# Patient Record
Sex: Male | Born: 1935 | Race: White | Hispanic: No | Marital: Married | Smoking: Never smoker
Health system: Southern US, Community
[De-identification: ages and names within clinical notes are randomized; demographics above are authoritative.]

## PROBLEM LIST (undated history)

## (undated) ENCOUNTER — Emergency Department (HOSPITAL_COMMUNITY): Admission: EM | Payer: Medicare Other | Source: Home / Self Care

## (undated) DIAGNOSIS — M1990 Unspecified osteoarthritis, unspecified site: Secondary | ICD-10-CM

## (undated) DIAGNOSIS — I350 Nonrheumatic aortic (valve) stenosis: Secondary | ICD-10-CM

## (undated) DIAGNOSIS — I2510 Atherosclerotic heart disease of native coronary artery without angina pectoris: Secondary | ICD-10-CM

## (undated) DIAGNOSIS — I509 Heart failure, unspecified: Secondary | ICD-10-CM

## (undated) DIAGNOSIS — E559 Vitamin D deficiency, unspecified: Secondary | ICD-10-CM

## (undated) DIAGNOSIS — Z8619 Personal history of other infectious and parasitic diseases: Secondary | ICD-10-CM

## (undated) DIAGNOSIS — H409 Unspecified glaucoma: Secondary | ICD-10-CM

## (undated) DIAGNOSIS — I493 Ventricular premature depolarization: Secondary | ICD-10-CM

## (undated) DIAGNOSIS — M720 Palmar fascial fibromatosis [Dupuytren]: Secondary | ICD-10-CM

## (undated) DIAGNOSIS — N419 Inflammatory disease of prostate, unspecified: Secondary | ICD-10-CM

## (undated) DIAGNOSIS — C801 Malignant (primary) neoplasm, unspecified: Secondary | ICD-10-CM

## (undated) DIAGNOSIS — I10 Essential (primary) hypertension: Secondary | ICD-10-CM

## (undated) DIAGNOSIS — E785 Hyperlipidemia, unspecified: Secondary | ICD-10-CM

## (undated) DIAGNOSIS — E538 Deficiency of other specified B group vitamins: Secondary | ICD-10-CM

## (undated) DIAGNOSIS — K649 Unspecified hemorrhoids: Secondary | ICD-10-CM

## (undated) DIAGNOSIS — E119 Type 2 diabetes mellitus without complications: Secondary | ICD-10-CM

## (undated) DIAGNOSIS — G629 Polyneuropathy, unspecified: Secondary | ICD-10-CM

## (undated) HISTORY — PX: MELANOMA EXCISION: SHX5266

## (undated) HISTORY — DX: Unspecified osteoarthritis, unspecified site: M19.90

## (undated) HISTORY — DX: Unspecified glaucoma: H40.9

## (undated) HISTORY — DX: Type 2 diabetes mellitus without complications: E11.9

## (undated) HISTORY — DX: Inflammatory disease of prostate, unspecified: N41.9

## (undated) HISTORY — PX: APPENDECTOMY: SHX54

## (undated) HISTORY — DX: Personal history of other infectious and parasitic diseases: Z86.19

## (undated) HISTORY — DX: Polyneuropathy, unspecified: G62.9

## (undated) HISTORY — DX: Deficiency of other specified B group vitamins: E53.8

## (undated) HISTORY — DX: Unspecified hemorrhoids: K64.9

## (undated) HISTORY — DX: Essential (primary) hypertension: I10

## (undated) HISTORY — DX: Malignant (primary) neoplasm, unspecified: C80.1

## (undated) HISTORY — DX: Hyperlipidemia, unspecified: E78.5

## (undated) HISTORY — DX: Palmar fascial fibromatosis (dupuytren): M72.0

## (undated) HISTORY — DX: Vitamin D deficiency, unspecified: E55.9

## (undated) HISTORY — DX: Ventricular premature depolarization: I49.3

## (undated) HISTORY — PX: HERNIA REPAIR: SHX51

## (undated) SURGICAL SUPPLY — 20 items
BALLOON MUSTANG 5X200X135 (BALLOONS) IMPLANT
CATH CXI SUPP 2.6F 150 ANG (CATHETERS) IMPLANT
CATH OMNI FLUSH 5F 65CM (CATHETERS) IMPLANT
CATH QUICKCROSS .035X135CM (MICROCATHETER) IMPLANT
GLIDEWIRE ADV .035X260CM (WIRE) IMPLANT
KIT ENCORE 26 ADVANTAGE (KITS) IMPLANT
KIT MICROPUNCTURE NIT STIFF (SHEATH) IMPLANT
KIT SINGLE USE MANIFOLD (KITS) IMPLANT
KIT SYRINGE INJ CVI SPIKEX1 (MISCELLANEOUS) IMPLANT
PACK CARDIAC CATHETERIZATION (CUSTOM PROCEDURE TRAY) IMPLANT
SET ATX-X65L (MISCELLANEOUS) IMPLANT
SHEATH CATAPULT 6FR 45 (SHEATH) IMPLANT
SHEATH PINNACLE 5F 10CM (SHEATH) IMPLANT
SHEATH PINNACLE 6F 10CM (SHEATH) IMPLANT
SHEATH PROBE COVER 6X72 (BAG) IMPLANT
STENT ELUVIA 6X120X130 (Permanent Stent) IMPLANT
STENT ELUVIA 6X150X130 (Permanent Stent) IMPLANT
WIRE BENTSON .035X145CM (WIRE) IMPLANT
WIRE G V18X300CM (WIRE) IMPLANT
WIRE HI TORQ COMMND ES.014X300 (WIRE) IMPLANT

---

## 1989-09-13 HISTORY — PX: COLON SURGERY: SHX602

## 1999-07-03 ENCOUNTER — Ambulatory Visit (HOSPITAL_COMMUNITY): Admission: RE | Admit: 1999-07-03 | Discharge: 1999-07-03 | Payer: Self-pay | Admitting: Gastroenterology

## 2004-06-23 ENCOUNTER — Ambulatory Visit (HOSPITAL_COMMUNITY): Admission: RE | Admit: 2004-06-23 | Discharge: 2004-06-23 | Payer: Self-pay | Admitting: Gastroenterology

## 2004-06-23 ENCOUNTER — Encounter (INDEPENDENT_AMBULATORY_CARE_PROVIDER_SITE_OTHER): Payer: Self-pay | Admitting: *Deleted

## 2006-07-18 ENCOUNTER — Encounter: Admission: RE | Admit: 2006-07-18 | Discharge: 2006-07-18 | Payer: Self-pay | Admitting: Gastroenterology

## 2008-08-14 ENCOUNTER — Encounter: Admission: RE | Admit: 2008-08-14 | Discharge: 2008-08-14 | Payer: Self-pay | Admitting: Gastroenterology

## 2008-09-12 ENCOUNTER — Encounter: Admission: RE | Admit: 2008-09-12 | Discharge: 2008-09-12 | Payer: Self-pay | Admitting: General Surgery

## 2009-11-05 ENCOUNTER — Ambulatory Visit (HOSPITAL_COMMUNITY): Admission: RE | Admit: 2009-11-05 | Discharge: 2009-11-05 | Payer: Self-pay | Admitting: Internal Medicine

## 2011-04-28 ENCOUNTER — Inpatient Hospital Stay (HOSPITAL_COMMUNITY)
Admission: EM | Admit: 2011-04-28 | Discharge: 2011-04-30 | DRG: 312 | Disposition: A | Discharge status: Home / Self Care | Payer: Medicare Other | Attending: Internal Medicine | Admitting: Internal Medicine

## 2011-04-28 ENCOUNTER — Emergency Department (HOSPITAL_COMMUNITY): Payer: Medicare Other

## 2011-04-28 DIAGNOSIS — C439 Malignant melanoma of skin, unspecified: Secondary | ICD-10-CM | POA: Diagnosis present

## 2011-04-28 DIAGNOSIS — E785 Hyperlipidemia, unspecified: Secondary | ICD-10-CM | POA: Diagnosis present

## 2011-04-28 DIAGNOSIS — H409 Unspecified glaucoma: Secondary | ICD-10-CM | POA: Diagnosis present

## 2011-04-28 DIAGNOSIS — E559 Vitamin D deficiency, unspecified: Secondary | ICD-10-CM | POA: Diagnosis present

## 2011-04-28 DIAGNOSIS — T465X5A Adverse effect of other antihypertensive drugs, initial encounter: Secondary | ICD-10-CM | POA: Diagnosis present

## 2011-04-28 DIAGNOSIS — E119 Type 2 diabetes mellitus without complications: Secondary | ICD-10-CM | POA: Diagnosis present

## 2011-04-28 DIAGNOSIS — G589 Mononeuropathy, unspecified: Secondary | ICD-10-CM | POA: Diagnosis present

## 2011-04-28 DIAGNOSIS — I10 Essential (primary) hypertension: Secondary | ICD-10-CM | POA: Diagnosis present

## 2011-04-28 DIAGNOSIS — Z79899 Other long term (current) drug therapy: Secondary | ICD-10-CM

## 2011-04-28 DIAGNOSIS — R55 Syncope and collapse: Principal | ICD-10-CM | POA: Diagnosis present

## 2011-04-28 LAB — HEPATIC FUNCTION PANEL
ALT: 24 U/L (ref 0–53)
AST: 19 U/L (ref 0–37)
Albumin: 3.8 g/dL (ref 3.5–5.2)
Alkaline Phosphatase: 57 U/L (ref 39–117)
Bilirubin, Direct: <0.1 mg/dL (ref 0.0–0.3)
Total Bilirubin: 0.4 mg/dL (ref 0.3–1.2)
Total Protein: 6.6 g/dL (ref 6.0–8.3)

## 2011-04-28 LAB — CBC
MCHC: 34 g/dL (ref 30.0–36.0)
MCV: 85.5 fL (ref 78.0–100.0)
Platelets: 146 10*3/uL — ABNORMAL LOW (ref 150–400)
RBC: 4.89 MIL/uL (ref 4.22–5.81)
WBC: 8.2 10*3/uL (ref 4.0–10.5)

## 2011-04-28 LAB — DIFFERENTIAL
Basophils Absolute: 0 10*3/uL (ref 0.0–0.1)
Eosinophils Absolute: 0.1 10*3/uL (ref 0.0–0.7)
Lymphocytes Relative: 13 % (ref 12–46)
Lymphs Abs: 1.1 10*3/uL (ref 0.7–4.0)
Monocytes Absolute: 0.6 10*3/uL (ref 0.1–1.0)
Monocytes Relative: 7 % (ref 3–12)
Neutro Abs: 6.4 10*3/uL (ref 1.7–7.7)

## 2011-04-28 LAB — CARDIAC PANEL(CRET KIN+CKTOT+MB+TROPI)
CK, MB: 2.4 ng/mL (ref 0.3–4.0)
Relative Index: INVALID (ref 0.0–2.5)
Total CK: 64 U/L (ref 7–232)
Troponin I: <0.3 ng/mL (ref <0.30)

## 2011-04-28 LAB — MAGNESIUM: Magnesium: 2 mg/dL (ref 1.5–2.5)

## 2011-04-28 LAB — URINALYSIS, ROUTINE W REFLEX MICROSCOPIC
Glucose, UA: NEGATIVE mg/dL
Urobilinogen, UA: 0.2 mg/dL (ref 0.0–1.0)
pH: 7 (ref 5.0–8.0)

## 2011-04-28 LAB — BASIC METABOLIC PANEL
CO2: 30 mEq/L (ref 19–32)
Chloride: 99 mEq/L (ref 96–112)
Creatinine, Ser: 0.94 mg/dL (ref 0.50–1.35)
GFR calc Af Amer: >60 mL/min (ref >60)
GFR calc non Af Amer: >60 mL/min (ref >60)
Glucose, Bld: 127 mg/dL — ABNORMAL HIGH (ref 70–99)

## 2011-04-28 LAB — D-DIMER, QUANTITATIVE (NOT AT ARMC): D-Dimer, Quant: 0.43 ug/mL-FEU (ref 0.00–0.48)

## 2011-04-29 ENCOUNTER — Inpatient Hospital Stay (HOSPITAL_COMMUNITY): Payer: Medicare Other

## 2011-04-29 DIAGNOSIS — R55 Syncope and collapse: Secondary | ICD-10-CM

## 2011-04-29 LAB — CARDIAC PANEL(CRET KIN+CKTOT+MB+TROPI)
CK, MB: 2.3 ng/mL (ref 0.3–4.0)
CK, MB: 2.3 ng/mL (ref 0.3–4.0)
Relative Index: INVALID (ref 0.0–2.5)
Relative Index: INVALID (ref 0.0–2.5)
Total CK: 62 U/L (ref 7–232)
Total CK: 62 U/L (ref 7–232)
Troponin I: <0.3 ng/mL (ref <0.30)
Troponin I: <0.3 ng/mL (ref <0.30)

## 2011-04-29 LAB — PROTIME-INR
INR: 1.01 (ref 0.00–1.49)
Prothrombin Time: 13.5 seconds (ref 11.6–15.2)

## 2011-04-29 LAB — VITAMIN B12: Vitamin B-12: 865 pg/mL (ref 211–911)

## 2011-04-29 LAB — RPR: RPR Ser Ql: NONREACTIVE

## 2011-04-29 LAB — LIPID PANEL
Cholesterol: 213 mg/dL — ABNORMAL HIGH (ref 0–200)
HDL: 40 mg/dL (ref >39)
LDL Cholesterol: 137 mg/dL — ABNORMAL HIGH (ref 0–99)
Total CHOL/HDL Ratio: 5.3 RATIO
Triglycerides: 182 mg/dL — ABNORMAL HIGH (ref <150)
VLDL: 36 mg/dL (ref 0–40)

## 2011-04-29 LAB — TSH: TSH: 5.664 u[IU]/mL — ABNORMAL HIGH (ref 0.350–4.500)

## 2011-04-29 MED ORDER — GADOBENATE DIMEGLUMINE 529 MG/ML IV SOLN
15.0000 mL | Freq: Once | INTRAVENOUS | Status: AC
  Administered 2011-04-29: 15 mL via INTRAVENOUS

## 2011-05-02 NOTE — H&P (Signed)
NAME:  Omar Haley, BLUE NO.:  [id number]  MEDICAL RECORD NO.:  [id number]  LOCATION:  2040                         FACILITY:  [facility]  PHYSICIAN:  Conley Canal, MD      DATE OF BIRTH:  Nov 07, 1930  DATE OF ADMISSION:  04/28/2011 DATE OF DISCHARGE:                             HISTORY & PHYSICAL   PRIMARY CARE PHYSICIAN:  Theressa Millard, MD  CHIEF COMPLAINT:  The patient passed out at home.  HISTORY OF PRESENT ILLNESS:  [name] is an extremely pleasant 77 year old male with history of hypertension who comes in with complaints of having passed out at home.  The patient says that he got up suddenly, walked a few steps, and then blacked out, and passed out, hit the floor with the back of his head, but he feels that he was unconscious for only a few seconds.  His wife was at home when this happened.  She called EMS who reported a blood pressure of 132/68 at the time of their evaluation, his heart rate was 70.  The patient believes that his symptoms started after he was started on a new medication for blood pressure about 3 days ago, today is day #3 of taking the blood pressure medication.  From his description, it is most likely amlodipine.  The patient believes that he got up pretty too quickly, hence possible orthostasis.  He denies any symptoms preceding this event.  No diarrhea, no nausea or vomiting, no headaches, no fever, no respiratory symptoms, so he had apparently been doing pretty well and this was of sudden onset, is the first such event. He denies any previous history of seizure disorder.  Otherwise, when he presented to the emergency room, he had a CT of the brain without contrast which did not show any evidence of an acute intracranial abnormality.  A chest x-ray showed a likely nipple shadow on the right, this needs to be reevaluated later on.  Labs including electrolytes, CBC, troponin, point of care were negative.  In the emergency room,  the patient's blood pressure was recorded as 155/68 with the heart rate of 54.  PAST MEDICAL HISTORY:  Hypertension.  ALLERGIES:  The patient denies any allergies.  HOME MEDICATIONS:  Amlodipine, losartan, HCTZ, vitamin D3, Lumigan eye drops, fish oil, vitamin B12, aspirin.  FAMILY HISTORY:  Negative for chronic medical conditions.  REVIEW OF SYSTEMS:  Unremarkable except as highlighted in the history of present illness.  SOCIAL HISTORY:  The patient is married.  He denies cigarette smoking, alcohol, or illicit drugs.  He is a retired Emergency planning/management officer.  PHYSICAL EXAMINATION:  GENERAL:  This is a well-looking elderly male who is not in acute distress. VITAL SIGNS:  Blood pressure 169/79, heart rate is 79, temperature 98.2, respirations 20, oxygen saturation is 97% on room air. HEAD, EARS, EYES, NOSE, and THROAT:  Pupils equal, reacting to light. He has a small bump in the occipital area.  No bleeding. NECK:  No carotid bruits.  No jugular venous distention. RESPIRATORY SYSTEM:  Good air entry bilaterally with no rhonchi, rales, or wheezes. CARDIOVASCULAR SYSTEM:  First and second heart sounds heard.  No murmurs.  Pulse regular.  ABDOMEN:  Scaphoid, soft, nontender.  No palpable organomegaly.  Bowel sounds are normal. CNS:  The patient is alert, oriented in person, place, and time with no acute focal neurological deficits. EXTREMITIES:  No pedal edema.  Peripheral pulses were equal.  Labs were reviewed and discussed above.  EKG shows sinus rhythm with ventricular rate at 71 beats per minute, some premature ventricular complexes, and a nonspecific intraventricular conduction delay.  IMPRESSION:  Pleasant 77 year old hypertensive male who is coming in after a syncopal episode at home. The differentials at this point include orthostatic hypotension, although his blood pressure has actually been on the high side since presentation to the emergency room. The other differential is  possible carotid artery stenosis or some acute intracranial event, arrhythmias, less likely seizures from the history of presentation, it could also be just a vasovagal event, however, this being the first time event, would worry about other serious conditions.  PLAN: 1. Syncopal event.  We will admit the patient to telemetry to Dr.     Newell [facility].  Dr. Earl Gala will assume the patient's care on     April 29, 2011.  Meanwhile, we will monitor the patient on     telemetry for arrhythmias, obtain 2D echocardiogram, carotid     duplex, MRI of the brain, check vitamin B12 level, TSH, RPR.     Continue aspirin, check lipids panel, cardiac enzymes, D-dimer,     fall precautions. 2. Hypertension.  Blood pressure, not well controlled.  Plan to     continue amlodipine and losartan/HCTZ combination.  Check     orthostatic vitals. 3. DVT and GI prophylaxis.  The patient's condition is fair.     Conley Canal, MD     SR/MEDQ  D:  04/28/2011  T:  04/28/2011  Job:  [id number]  cc:   Theressa Millard, M.D.  Electronically Signed by Conley Canal  on 05/02/2011 01:34:48 PM

## 2011-05-07 NOTE — Discharge Summary (Signed)
  NAME:  Omar Haley, Omar Haley NO.:  [id number]  MEDICAL RECORD NO.:  [id number]  LOCATION:  2040                         FACILITY:  [facility]  PHYSICIAN:  Theressa Millard, M.D.    DATE OF BIRTH:  09/11/1931  DATE OF ADMISSION:  04/28/2011 DATE OF DISCHARGE:  04/30/2011                              DISCHARGE SUMMARY   ADMITTING DIAGNOSIS:  Syncope.  DISCHARGE DIAGNOSES: 1. Syncope related to excessive blood pressure lowering from addition     of amlodipine. 2. Hypertension. 3. Glaucoma. 4. Vitamin D deficiency. 5. Neuropathy. 6. Hyperlipidemia. 7. Diet-controlled diabetes mellitus. 8. Malignant melanoma.  The patient is a 77 year old white male who has recently been found to have a melanoma.  He was in [facility] a week or so ago to get this removed when his blood pressure was very high.  We observed for a few days and his blood pressure did not come down adequately and we added amlodipine 5 mg daily.  He tolerated that well for the first 3 days but on the third day after standing up and walking into the next room he became quite lightheaded and then passed out momentarily.  He did strike his head on the floor.  He was brought to the hospital where a CT scan was negative.  After admission, he was monitored on telemetry for 48 hours and there were no arrhythmias.  Amlodipine was decreased to 2.5 mg daily and this resulted in good blood pressure control and no excessive orthostasis.  MRI of the brain showed no abnormalities.  Echocardiogram showed normal LV function with a little bit of aortic sclerosis.  He did have a chest x-ray which showed a probable nipple shadow and repeat chest x-ray with nipple markers was recommended.  He is discharged in improved condition.  DISCHARGE MEDICATIONS: 1. Amlodipine 2.5 mg daily. 2. Aspirin 81 mg daily. 3. Fish oil once daily. 4. Losartan/hydrochlorothiazide 100/25 daily. 5. Lumigan 0.01% 1 drop each eye at bedtime. 6.  Vitamin B12 1000 mcg daily. 7. Vitamin D3 50,000 units once every 2 weeks.  FOLLOWUP:  We will call him to make an appointment for him to see our nurse practitioner next week.  ACTIVITY:  As tolerated.  DIET:  No added salt.     Theressa Millard, M.D.     JO/MEDQ  D:  04/30/2011  T:  04/30/2011  Job:  [id number]  Electronically Signed by Theressa Millard M.D. on 05/07/2011 08:50:08 AM

## 2011-12-22 DIAGNOSIS — Z85820 Personal history of malignant melanoma of skin: Secondary | ICD-10-CM | POA: Diagnosis not present

## 2011-12-22 DIAGNOSIS — Z09 Encounter for follow-up examination after completed treatment for conditions other than malignant neoplasm: Secondary | ICD-10-CM | POA: Diagnosis not present

## 2011-12-22 DIAGNOSIS — C439 Malignant melanoma of skin, unspecified: Secondary | ICD-10-CM | POA: Diagnosis not present

## 2011-12-29 ENCOUNTER — Other Ambulatory Visit: Payer: Self-pay | Admitting: Internal Medicine

## 2011-12-29 DIAGNOSIS — I10 Essential (primary) hypertension: Secondary | ICD-10-CM | POA: Diagnosis not present

## 2011-12-29 DIAGNOSIS — R1310 Dysphagia, unspecified: Secondary | ICD-10-CM | POA: Diagnosis not present

## 2011-12-29 DIAGNOSIS — E1142 Type 2 diabetes mellitus with diabetic polyneuropathy: Secondary | ICD-10-CM | POA: Diagnosis not present

## 2011-12-29 DIAGNOSIS — E1149 Type 2 diabetes mellitus with other diabetic neurological complication: Secondary | ICD-10-CM | POA: Diagnosis not present

## 2012-01-03 ENCOUNTER — Ambulatory Visit
Admission: RE | Admit: 2012-01-03 | Discharge: 2012-01-03 | Disposition: A | Discharge status: Home / Self Care | Payer: Medicare Other | Source: Ambulatory Visit | Attending: Internal Medicine | Admitting: Internal Medicine

## 2012-01-03 DIAGNOSIS — K449 Diaphragmatic hernia without obstruction or gangrene: Secondary | ICD-10-CM | POA: Diagnosis not present

## 2012-01-03 DIAGNOSIS — R1310 Dysphagia, unspecified: Secondary | ICD-10-CM | POA: Diagnosis not present

## 2012-01-03 DIAGNOSIS — K219 Gastro-esophageal reflux disease without esophagitis: Secondary | ICD-10-CM | POA: Diagnosis not present

## 2012-01-31 DIAGNOSIS — H409 Unspecified glaucoma: Secondary | ICD-10-CM | POA: Diagnosis not present

## 2012-01-31 DIAGNOSIS — H4011X0 Primary open-angle glaucoma, stage unspecified: Secondary | ICD-10-CM | POA: Diagnosis not present

## 2012-02-17 DIAGNOSIS — K449 Diaphragmatic hernia without obstruction or gangrene: Secondary | ICD-10-CM | POA: Diagnosis not present

## 2012-02-17 DIAGNOSIS — K222 Esophageal obstruction: Secondary | ICD-10-CM | POA: Diagnosis not present

## 2012-02-17 DIAGNOSIS — R1310 Dysphagia, unspecified: Secondary | ICD-10-CM | POA: Diagnosis not present

## 2012-02-29 DIAGNOSIS — L570 Actinic keratosis: Secondary | ICD-10-CM | POA: Diagnosis not present

## 2012-02-29 DIAGNOSIS — Z85820 Personal history of malignant melanoma of skin: Secondary | ICD-10-CM | POA: Diagnosis not present

## 2012-02-29 DIAGNOSIS — D235 Other benign neoplasm of skin of trunk: Secondary | ICD-10-CM | POA: Diagnosis not present

## 2012-06-26 DIAGNOSIS — Z09 Encounter for follow-up examination after completed treatment for conditions other than malignant neoplasm: Secondary | ICD-10-CM | POA: Diagnosis not present

## 2012-06-26 DIAGNOSIS — Z85820 Personal history of malignant melanoma of skin: Secondary | ICD-10-CM | POA: Diagnosis not present

## 2012-06-26 DIAGNOSIS — C434 Malignant melanoma of scalp and neck: Secondary | ICD-10-CM | POA: Diagnosis not present

## 2012-07-05 DIAGNOSIS — Z0000 Encounter for general adult medical examination without abnormal findings: Secondary | ICD-10-CM | POA: Diagnosis not present

## 2012-07-05 DIAGNOSIS — E1142 Type 2 diabetes mellitus with diabetic polyneuropathy: Secondary | ICD-10-CM | POA: Diagnosis not present

## 2012-07-05 DIAGNOSIS — I10 Essential (primary) hypertension: Secondary | ICD-10-CM | POA: Diagnosis not present

## 2012-07-05 DIAGNOSIS — E559 Vitamin D deficiency, unspecified: Secondary | ICD-10-CM | POA: Diagnosis not present

## 2012-07-05 DIAGNOSIS — R1310 Dysphagia, unspecified: Secondary | ICD-10-CM | POA: Diagnosis not present

## 2012-07-05 DIAGNOSIS — Z1331 Encounter for screening for depression: Secondary | ICD-10-CM | POA: Diagnosis not present

## 2012-07-05 DIAGNOSIS — E1149 Type 2 diabetes mellitus with other diabetic neurological complication: Secondary | ICD-10-CM | POA: Diagnosis not present

## 2012-08-01 DIAGNOSIS — H4011X0 Primary open-angle glaucoma, stage unspecified: Secondary | ICD-10-CM | POA: Diagnosis not present

## 2012-08-01 DIAGNOSIS — H409 Unspecified glaucoma: Secondary | ICD-10-CM | POA: Diagnosis not present

## 2012-08-23 DIAGNOSIS — C44621 Squamous cell carcinoma of skin of unspecified upper limb, including shoulder: Secondary | ICD-10-CM | POA: Diagnosis not present

## 2012-08-23 DIAGNOSIS — Z85820 Personal history of malignant melanoma of skin: Secondary | ICD-10-CM | POA: Diagnosis not present

## 2012-08-23 DIAGNOSIS — L570 Actinic keratosis: Secondary | ICD-10-CM | POA: Diagnosis not present

## 2012-08-23 DIAGNOSIS — L821 Other seborrheic keratosis: Secondary | ICD-10-CM | POA: Diagnosis not present

## 2012-10-10 DIAGNOSIS — Z85820 Personal history of malignant melanoma of skin: Secondary | ICD-10-CM | POA: Diagnosis not present

## 2012-10-10 DIAGNOSIS — Z85828 Personal history of other malignant neoplasm of skin: Secondary | ICD-10-CM | POA: Diagnosis not present

## 2012-10-10 DIAGNOSIS — L821 Other seborrheic keratosis: Secondary | ICD-10-CM | POA: Diagnosis not present

## 2012-11-28 DIAGNOSIS — R1310 Dysphagia, unspecified: Secondary | ICD-10-CM | POA: Diagnosis not present

## 2012-11-28 DIAGNOSIS — K222 Esophageal obstruction: Secondary | ICD-10-CM | POA: Diagnosis not present

## 2012-11-28 DIAGNOSIS — R0789 Other chest pain: Secondary | ICD-10-CM | POA: Diagnosis not present

## 2012-11-28 DIAGNOSIS — I4510 Unspecified right bundle-branch block: Secondary | ICD-10-CM | POA: Diagnosis not present

## 2012-11-28 DIAGNOSIS — E785 Hyperlipidemia, unspecified: Secondary | ICD-10-CM | POA: Diagnosis not present

## 2012-11-28 DIAGNOSIS — E1149 Type 2 diabetes mellitus with other diabetic neurological complication: Secondary | ICD-10-CM | POA: Diagnosis not present

## 2012-11-28 DIAGNOSIS — E1142 Type 2 diabetes mellitus with diabetic polyneuropathy: Secondary | ICD-10-CM | POA: Diagnosis not present

## 2012-11-28 DIAGNOSIS — R0989 Other specified symptoms and signs involving the circulatory and respiratory systems: Secondary | ICD-10-CM | POA: Diagnosis not present

## 2012-11-28 DIAGNOSIS — I10 Essential (primary) hypertension: Secondary | ICD-10-CM | POA: Diagnosis not present

## 2012-11-28 DIAGNOSIS — I359 Nonrheumatic aortic valve disorder, unspecified: Secondary | ICD-10-CM | POA: Diagnosis not present

## 2012-12-06 DIAGNOSIS — E1149 Type 2 diabetes mellitus with other diabetic neurological complication: Secondary | ICD-10-CM | POA: Diagnosis not present

## 2012-12-06 DIAGNOSIS — I4510 Unspecified right bundle-branch block: Secondary | ICD-10-CM | POA: Diagnosis not present

## 2012-12-06 DIAGNOSIS — R0789 Other chest pain: Secondary | ICD-10-CM | POA: Diagnosis not present

## 2012-12-06 DIAGNOSIS — E785 Hyperlipidemia, unspecified: Secondary | ICD-10-CM | POA: Diagnosis not present

## 2012-12-06 DIAGNOSIS — I359 Nonrheumatic aortic valve disorder, unspecified: Secondary | ICD-10-CM | POA: Diagnosis not present

## 2012-12-06 DIAGNOSIS — I10 Essential (primary) hypertension: Secondary | ICD-10-CM | POA: Diagnosis not present

## 2012-12-06 DIAGNOSIS — R0989 Other specified symptoms and signs involving the circulatory and respiratory systems: Secondary | ICD-10-CM | POA: Diagnosis not present

## 2012-12-07 DIAGNOSIS — Z85828 Personal history of other malignant neoplasm of skin: Secondary | ICD-10-CM | POA: Diagnosis not present

## 2012-12-07 DIAGNOSIS — Z09 Encounter for follow-up examination after completed treatment for conditions other than malignant neoplasm: Secondary | ICD-10-CM | POA: Diagnosis not present

## 2012-12-07 DIAGNOSIS — Z9889 Other specified postprocedural states: Secondary | ICD-10-CM | POA: Diagnosis not present

## 2012-12-07 DIAGNOSIS — C439 Malignant melanoma of skin, unspecified: Secondary | ICD-10-CM | POA: Diagnosis not present

## 2012-12-08 DIAGNOSIS — R0989 Other specified symptoms and signs involving the circulatory and respiratory systems: Secondary | ICD-10-CM | POA: Diagnosis not present

## 2013-01-03 DIAGNOSIS — E042 Nontoxic multinodular goiter: Secondary | ICD-10-CM | POA: Diagnosis not present

## 2013-01-03 DIAGNOSIS — I10 Essential (primary) hypertension: Secondary | ICD-10-CM | POA: Diagnosis not present

## 2013-01-03 DIAGNOSIS — E1149 Type 2 diabetes mellitus with other diabetic neurological complication: Secondary | ICD-10-CM | POA: Diagnosis not present

## 2013-01-03 DIAGNOSIS — E1142 Type 2 diabetes mellitus with diabetic polyneuropathy: Secondary | ICD-10-CM | POA: Diagnosis not present

## 2013-01-03 DIAGNOSIS — I779 Disorder of arteries and arterioles, unspecified: Secondary | ICD-10-CM | POA: Diagnosis not present

## 2013-01-30 DIAGNOSIS — H2510 Age-related nuclear cataract, unspecified eye: Secondary | ICD-10-CM | POA: Diagnosis not present

## 2013-01-30 DIAGNOSIS — H4011X0 Primary open-angle glaucoma, stage unspecified: Secondary | ICD-10-CM | POA: Diagnosis not present

## 2013-01-30 DIAGNOSIS — H409 Unspecified glaucoma: Secondary | ICD-10-CM | POA: Diagnosis not present

## 2013-03-06 DIAGNOSIS — C4441 Basal cell carcinoma of skin of scalp and neck: Secondary | ICD-10-CM | POA: Diagnosis not present

## 2013-03-06 DIAGNOSIS — L820 Inflamed seborrheic keratosis: Secondary | ICD-10-CM | POA: Diagnosis not present

## 2013-03-06 DIAGNOSIS — Z85820 Personal history of malignant melanoma of skin: Secondary | ICD-10-CM | POA: Diagnosis not present

## 2013-03-06 DIAGNOSIS — L570 Actinic keratosis: Secondary | ICD-10-CM | POA: Diagnosis not present

## 2013-04-17 DIAGNOSIS — H1045 Other chronic allergic conjunctivitis: Secondary | ICD-10-CM | POA: Diagnosis not present

## 2013-05-16 DIAGNOSIS — K645 Perianal venous thrombosis: Secondary | ICD-10-CM | POA: Diagnosis not present

## 2013-05-21 ENCOUNTER — Ambulatory Visit (INDEPENDENT_AMBULATORY_CARE_PROVIDER_SITE_OTHER): Payer: Medicare Other | Admitting: General Surgery

## 2013-05-21 ENCOUNTER — Encounter (INDEPENDENT_AMBULATORY_CARE_PROVIDER_SITE_OTHER): Payer: Self-pay | Admitting: General Surgery

## 2013-05-21 ENCOUNTER — Encounter (INDEPENDENT_AMBULATORY_CARE_PROVIDER_SITE_OTHER): Payer: Self-pay

## 2013-05-21 VITALS — BP 152/96 | HR 65 | Temp 97.0°F | Resp 16 | Ht 70.0 in | Wt 167.0 lb

## 2013-05-21 DIAGNOSIS — K645 Perianal venous thrombosis: Secondary | ICD-10-CM | POA: Diagnosis not present

## 2013-05-21 NOTE — Patient Instructions (Signed)
You had a thrombosed external hemorrhoid in the right anterior position today.  I did not feel anything else abnormal up inside.  We excised the thrombosed hemorrhoid in the office today, and that went well.  This will take 10-14 days to heal. You'll see a little bit of bloody spotting until then.  Wear a pad  Take Metamucil once or twice a day and drink lots of water  Use baby wipes in stead of dry toilet paper.  Return to see Dr. Derrell Lolling if this does not heal completely.     Hemorrhoids Hemorrhoids are swollen veins around the rectum or anus. There are two types of hemorrhoids:   Internal hemorrhoids. These occur in the veins just inside the rectum. They may poke through to the outside and become irritated and painful.  External hemorrhoids. These occur in the veins outside the anus and can be felt as a painful swelling or hard lump near the anus. CAUSES  Pregnancy.   Obesity.   Constipation or diarrhea.   Straining to have a bowel movement.   Sitting for long periods on the toilet.  Heavy lifting or other activity that caused you to strain.  Anal intercourse. SYMPTOMS   Pain.   Anal itching or irritation.   Rectal bleeding.   Fecal leakage.   Anal swelling.   One or more lumps around the anus.  DIAGNOSIS  Your caregiver may be able to diagnose hemorrhoids by visual examination. Other examinations or tests that may be performed include:   Examination of the rectal area with a gloved hand (digital rectal exam).   Examination of anal canal using a small tube (scope).   A blood test if you have lost a significant amount of blood.  A test to look inside the colon (sigmoidoscopy or colonoscopy). TREATMENT Most hemorrhoids can be treated at home. However, if symptoms do not seem to be getting better or if you have a lot of rectal bleeding, your caregiver may perform a procedure to help make the hemorrhoids get smaller or remove them completely.  Possible treatments include:   Placing a rubber band at the base of the hemorrhoid to cut off the circulation (rubber band ligation).   Injecting a chemical to shrink the hemorrhoid (sclerotherapy).   Using a tool to burn the hemorrhoid (infrared light therapy).   Surgically removing the hemorrhoid (hemorrhoidectomy).   Stapling the hemorrhoid to block blood flow to the tissue (hemorrhoid stapling).  HOME CARE INSTRUCTIONS   Eat foods with fiber, such as whole grains, beans, nuts, fruits, and vegetables. Ask your doctor about taking products with added fiber in them (fibersupplements).  Increase fluid intake. Drink enough water and fluids to keep your urine clear or pale yellow.   Exercise regularly.   Go to the bathroom when you have the urge to have a bowel movement. Do not wait.   Avoid straining to have bowel movements.   Keep the anal area dry and clean. Use wet toilet paper or moist towelettes after a bowel movement.   Medicated creams and suppositories may be used or applied as directed.   Only take over-the-counter or prescription medicines as directed by your caregiver.   Take warm sitz baths for 15 20 minutes, 3 4 times a day to ease pain and discomfort.   Place ice packs on the hemorrhoids if they are tender and swollen. Using ice packs between sitz baths may be helpful.   Put ice in a plastic bag.   Place a  towel between your skin and the bag.   Leave the ice on for 15 20 minutes, 3 4 times a day.   Do not use a donut-shaped pillow or sit on the toilet for long periods. This increases blood pooling and pain.  SEEK MEDICAL CARE IF:  You have increasing pain and swelling that is not controlled by treatment or medicine.  You have uncontrolled bleeding.  You have difficulty or you are unable to have a bowel movement.  You have pain or inflammation outside the area of the hemorrhoids. MAKE SURE YOU:  Understand these instructions.  Will  watch your condition.  Will get help right away if you are not doing well or get worse. Document Released: 08/27/2000 Document Revised: 08/16/2012 Document Reviewed: 07/04/2012 [facility] Patient Information 2014 [place], [place].

## 2013-05-21 NOTE — Progress Notes (Signed)
Patient ID: Omar Haley, male   DOB: [birth year], 79 y.o.   MRN: [id number] History: This patient is referred by Dr. Clinton Sawyer for management of thrombosed external hemorrhoid. Theressa Millard is his primary care physician. The patient states that Dr.  Darral Dash did some  hemorrhoid procedure many years ago. He's had only intermittent flareups since then. He now presents with a three-week history of a painful lump in the perianal area. This has not responded to sitz  and topical creams. He has pain and a lump,  with no bleeding. Last colonoscopy 3 years ago. He is due for colonoscopy this year because of polyps. Comorbid these include hypertension. Colectomy for polyps by Dr. Orpah Greek. Left inguinal hernia repair by me 2010.  ROS:  10 system review of systems is negative except as described above  Exam: Pleasant patient. In no distress Lungs clear auscultation bilaterally Heart: Regular rate and rhythm, no  murmurs Abdomen soft. Nontender. Well-healed midline scar. Well-healed left inguinal scar. Rectal: Thrombosed external hemorrhoid, right anterior. Digital rectal exam reveals no mass or other pathology. Following Betadine prep this area was anesthetized with 1% Xylocaine and conservatively excised. He tolerated this well. Hemostasis excellent  Assessment: Thrombosed external hemorrhoid, right anterior. Symptomatic despite adequate medical therapy  Plan: External hemorrhoid excised in office today Wound care discussed Strategies to avoid constipation discussed Return to see me if this does not completely heal in 2-3 weeks.   Angelia Mould. Derrell Lolling, M.D., [facility] General and Minimally invasive Surgery Breast and Colorectal Surgery Office:   [phone] Pager:   [phone]

## 2013-05-29 DIAGNOSIS — C434 Malignant melanoma of scalp and neck: Secondary | ICD-10-CM | POA: Diagnosis not present

## 2013-06-28 ENCOUNTER — Encounter (INDEPENDENT_AMBULATORY_CARE_PROVIDER_SITE_OTHER): Payer: Self-pay

## 2013-06-28 ENCOUNTER — Ambulatory Visit (INDEPENDENT_AMBULATORY_CARE_PROVIDER_SITE_OTHER): Payer: Medicare Other | Admitting: General Surgery

## 2013-06-28 ENCOUNTER — Telehealth (INDEPENDENT_AMBULATORY_CARE_PROVIDER_SITE_OTHER): Payer: Self-pay

## 2013-06-28 ENCOUNTER — Encounter (INDEPENDENT_AMBULATORY_CARE_PROVIDER_SITE_OTHER): Payer: Self-pay | Admitting: General Surgery

## 2013-06-28 VITALS — BP 152/74 | HR 76 | Resp 16 | Ht 70.0 in | Wt 162.6 lb

## 2013-06-28 DIAGNOSIS — K649 Unspecified hemorrhoids: Secondary | ICD-10-CM

## 2013-06-28 MED ORDER — HYDROCORTISONE ACETATE 25 MG RE SUPP: 25.0000 mg | Freq: Every day | RECTAL | Status: DC

## 2013-06-28 NOTE — Patient Instructions (Signed)

## 2013-06-28 NOTE — Telephone Encounter (Signed)
Pt called me back to see if I got the message from [place].  I told him I did but Dr Derrell Lolling has no soon openings so I was trying to figure out when to bring him in.  I offered for him to come in today with Dr Maisie Fus who specializes in the rectum and colon.  He agrees to an appointment today at 2:30.  I told him he can follow up later with Dr Derrell Lolling if needed.

## 2013-06-28 NOTE — Telephone Encounter (Signed)
Patient states his hems have returned but a little higher than the last ones. Denies bleeding or thromb. I do not see anything for Dr. Derrell Lolling this month please advise

## 2013-06-28 NOTE — Progress Notes (Signed)
Chief Complaint  Patient presents with  . New Evaluation    hems     HISTORY: Omar Haley is a 79 y.o. male who presents to the office with rectal pain.  Other symptoms include burning, swelling.  This had been occurring since his excision of a thrombosed hem.  He has tried OTC treatments in the past with good success.  BM's makes the symptoms worse.   It is continuous in nature.  His bowel habits are regular and his bowel movements are soft while taking metamucil.  His fiber intake is good.  His last colonoscopy was about 5 yrs ago and he is due again this year.  He is s/p banding in the 70's.  And a R colectomy in the 90's for a mass.    Past Medical History  Diagnosis Date  . Hyperlipidemia   . Hypertension   . Unifocal PVCs   . Hemorrhoids   . Dupuytren's contracture   . History of shingles   . Prostatitis   . DJD (degenerative joint disease)   . Diabetes mellitus without complication   . Neuropathy   . B12 deficiency   . Vitamin D deficiency   . Glaucoma   . Cancer     melanoma      Past Surgical History  Procedure Laterality Date  . Colon surgery  1991    rt hemicolectomy  . Melanoma excision  11/2005&04/2011    skin  . Hernia repair          Current Outpatient Prescriptions  Medication Sig Dispense Refill  . Aspirin 81 MG EC tablet Take 81 mg by mouth daily.      . Cyanocobalamin (VITAMIN B 12 PO) Take 1,000 mcg by mouth daily.      . cyclobenzaprine (FLEXERIL) 10 MG tablet Take 10 mg by mouth at bedtime as needed for muscle spasms. Take 1/2 to 1 tablet      . ergocalciferol (VITAMIN D2) 50000 UNITS capsule Take 50,000 Units by mouth once a week.      [name] Kitchen ibuprofen (ADVIL,MOTRIN) 200 MG tablet Take 200 mg by mouth every 4 (four) hours as needed for pain.      [name] Kitchen losartan-hydrochlorothiazide (HYZAAR) 100-25 MG per tablet Take 1 tablet by mouth daily.      . nitroGLYCERIN (NITROSTAT) 0.4 MG SL tablet Place 0.4 mg under the tongue every 5 (five) minutes as needed for  chest pain.      . simvastatin (ZOCOR) 20 MG tablet Take 20 mg by mouth every evening.      . Travoprost, BAK Free, (TRAVATAN) 0.004 % SOLN ophthalmic solution 1 drop at bedtime.       No current facility-administered medications for this visit.      Allergies  Allergen Reactions  . Enalapril     cough  . Gemfibrozil   . Micardis [Telmisartan]     Diarrhea   . Zetia [Ezetimibe]     laryngitis      Family History  Problem Relation Age of Onset  . Heart failure Mother   . Cerebral aneurysm Father     History   Social History  . Marital Status: Married    Spouse Name: N/A    Number of Children: N/A  . Years of Education: N/A   Social History Main Topics  . Smoking status: Never Smoker   . Smokeless tobacco: None  . Alcohol Use: No  . Drug Use: No  . Sexual Activity: None   Other  Topics Concern  . None   Social History Narrative  . None      REVIEW OF SYSTEMS - PERTINENT POSITIVES ONLY: Review of Systems - General ROS: negative for - chills, fever or weight loss Hematological and Lymphatic ROS: negative for - bleeding problems, blood clots or bruising Respiratory ROS: no cough, shortness of breath, or wheezing Cardiovascular ROS: no chest pain or dyspnea on exertion Gastrointestinal ROS: no abdominal pain, change in bowel habits, or black or bloody stools Genito-Urinary ROS: no dysuria, trouble voiding, or hematuria  EXAM: Filed Vitals:   06/28/13 1437  BP: 152/74  Pulse: 76  Resp: 16    General appearance: alert and cooperative Resp: clear to auscultation bilaterally Cardio: regular rate and rhythm GI: normal findings: soft, non-tender   Procedure: Anoscopy Surgeon: Maisie Fus Diagnosis: anal pain  Assistant: Christella Scheuermann After the risks and benefits were explained, verbal consent was obtained for above procedure  Anesthesia: none Findings: large, inflamed R posterior internal hemorrhoid, moderate, non-inflamed external hemorrhoids    ASSESSMENT AND  PLAN: Omar Haley is a 79 y.o. M with anal pain.  On exam he has an inflamed hemorrhoid internally.  I will have him start an anusol suppository nightly for 10 days.  If this does not resolve his pain, he would be a good candidate for banding.      Vanita Panda, MD Colon and Rectal Surgery / General Surgery [facility]      Visit Diagnoses: No diagnosis found.  Primary Care Physician: Darnelle Bos, MD

## 2013-07-06 DIAGNOSIS — Z23 Encounter for immunization: Secondary | ICD-10-CM | POA: Diagnosis not present

## 2013-07-10 DIAGNOSIS — Z0000 Encounter for general adult medical examination without abnormal findings: Secondary | ICD-10-CM | POA: Diagnosis not present

## 2013-07-10 DIAGNOSIS — Z1331 Encounter for screening for depression: Secondary | ICD-10-CM | POA: Diagnosis not present

## 2013-07-10 DIAGNOSIS — I10 Essential (primary) hypertension: Secondary | ICD-10-CM | POA: Diagnosis not present

## 2013-07-10 DIAGNOSIS — E1149 Type 2 diabetes mellitus with other diabetic neurological complication: Secondary | ICD-10-CM | POA: Diagnosis not present

## 2013-07-10 DIAGNOSIS — E1142 Type 2 diabetes mellitus with diabetic polyneuropathy: Secondary | ICD-10-CM | POA: Diagnosis not present

## 2013-07-19 ENCOUNTER — Other Ambulatory Visit (INDEPENDENT_AMBULATORY_CARE_PROVIDER_SITE_OTHER): Payer: Self-pay

## 2013-07-19 DIAGNOSIS — K649 Unspecified hemorrhoids: Secondary | ICD-10-CM

## 2013-07-19 MED ORDER — HYDROCORTISONE ACETATE 25 MG RE SUPP: 25.0000 mg | Freq: Every day | RECTAL | Status: DC

## 2013-07-26 ENCOUNTER — Ambulatory Visit (INDEPENDENT_AMBULATORY_CARE_PROVIDER_SITE_OTHER): Payer: Medicare Other | Admitting: General Surgery

## 2013-07-26 ENCOUNTER — Encounter (INDEPENDENT_AMBULATORY_CARE_PROVIDER_SITE_OTHER): Payer: Self-pay | Admitting: General Surgery

## 2013-07-26 VITALS — BP 140/90 | HR 76 | Temp 98.4°F | Resp 15 | Ht 70.0 in | Wt 164.6 lb

## 2013-07-26 DIAGNOSIS — K649 Unspecified hemorrhoids: Secondary | ICD-10-CM | POA: Diagnosis not present

## 2013-07-26 NOTE — Patient Instructions (Signed)
You have completely healed the skin on the outside where we removed the thrombosed hemorrhoid on September 8. The internal hemorrhoids are also resolving and there is minimal inflammation.  There is no indication for surgical intervention.  Read the patient information booklet that I gave you.  Stay well hydrated and follow a high-fiber, low-fat diet.  It would be advisable to take a daily fiber supplementation such as Metamucil  Return to see [place] as needed.    Hemorrhoids Hemorrhoids are swollen veins around the rectum or anus. There are two types of hemorrhoids:   Internal hemorrhoids. These occur in the veins just inside the rectum. They may poke through to the outside and become irritated and painful.  External hemorrhoids. These occur in the veins outside the anus and can be felt as a painful swelling or hard lump near the anus. CAUSES  Pregnancy.   Obesity.   Constipation or diarrhea.   Straining to have a bowel movement.   Sitting for long periods on the toilet.  Heavy lifting or other activity that caused you to strain.  Anal intercourse. SYMPTOMS   Pain.   Anal itching or irritation.   Rectal bleeding.   Fecal leakage.   Anal swelling.   One or more lumps around the anus.  DIAGNOSIS  Your caregiver may be able to diagnose hemorrhoids by visual examination. Other examinations or tests that may be performed include:   Examination of the rectal area with a gloved hand (digital rectal exam).   Examination of anal canal using a small tube (scope).   A blood test if you have lost a significant amount of blood.  A test to look inside the colon (sigmoidoscopy or colonoscopy). TREATMENT Most hemorrhoids can be treated at home. However, if symptoms do not seem to be getting better or if you have a lot of rectal bleeding, your caregiver may perform a procedure to help make the hemorrhoids get smaller or remove them completely. Possible treatments  include:   Placing a rubber band at the base of the hemorrhoid to cut off the circulation (rubber band ligation).   Injecting a chemical to shrink the hemorrhoid (sclerotherapy).   Using a tool to burn the hemorrhoid (infrared light therapy).   Surgically removing the hemorrhoid (hemorrhoidectomy).   Stapling the hemorrhoid to block blood flow to the tissue (hemorrhoid stapling).  HOME CARE INSTRUCTIONS   Eat foods with fiber, such as whole grains, beans, nuts, fruits, and vegetables. Ask your doctor about taking products with added fiber in them (fibersupplements).  Increase fluid intake. Drink enough water and fluids to keep your urine clear or pale yellow.   Exercise regularly.   Go to the bathroom when you have the urge to have a bowel movement. Do not wait.   Avoid straining to have bowel movements.   Keep the anal area dry and clean. Use wet toilet paper or moist towelettes after a bowel movement.   Medicated creams and suppositories may be used or applied as directed.   Only take over-the-counter or prescription medicines as directed by your caregiver.   Take warm sitz baths for 15 20 minutes, 3 4 times a day to ease pain and discomfort.   Place ice packs on the hemorrhoids if they are tender and swollen. Using ice packs between sitz baths may be helpful.   Put ice in a plastic bag.   Place a towel between your skin and the bag.   Leave the ice on for 15 20  minutes, 3 4 times a day.   Do not use a donut-shaped pillow or sit on the toilet for long periods. This increases blood pooling and pain.  SEEK MEDICAL CARE IF:  You have increasing pain and swelling that is not controlled by treatment or medicine.  You have uncontrolled bleeding.  You have difficulty or you are unable to have a bowel movement.  You have pain or inflammation outside the area of the hemorrhoids. MAKE SURE YOU:  Understand these instructions.  Will watch your  condition.  Will get help right away if you are not doing well or get worse. Document Released: 08/27/2000 Document Revised: 08/16/2012 Document Reviewed: 07/04/2012 [facility] Patient Information 2014 [place], [place].

## 2013-07-26 NOTE — Progress Notes (Signed)
Patient ID: Omar Haley, male   DOB: 01-21-1931, 79 y.o.   MRN: [id number] History: Patient returns in followup regarding his hemorrhoids. Of 05/21/2013 I excised the thrombosed external hemorrhoid on the right anterior position. He states that completely healed. On October 16 he return to the office because of an inflamed internal hemorrhoid. Dr. Maisie Fus performed anooscopy and placed him on Anusol suppositories. He is now asymptomatic. No pain. No swelling. No bleeding. Trying to keep his stool soft. He does he tend toward constipation  ROS: 10 system review of systems is performed and this negative except as described above  Family History, social history, past history are reviewed and are unchanged.  Exam: Patient looks well. Good spirits. No distress Abdomen soft and nontender Rectal exam shows minimal external skin tags, soft, noninflamed, is completely healed. No dermatitis. Digital rectal exam reveals normal sphincter tone. No mass. No blood. No tenderness. No fissure.  Assessment: Thrombosed external hemorrhoid, right anterior, uneventful healing following excision Inflamed internal hemorrhoid, now asymptomatic following topical therapy with Anusol  Plan: No further intervention required Patient information booklet given  to the patient and discussed in detail for self care and prevention Return to see [place] as needed.   Angelia Mould. Derrell Lolling, M.D., [facility] General and Minimally invasive Surgery Breast and Colorectal Surgery Office:   [phone] Pager:   [phone]

## 2013-07-30 DIAGNOSIS — H4011X0 Primary open-angle glaucoma, stage unspecified: Secondary | ICD-10-CM | POA: Diagnosis not present

## 2013-07-30 DIAGNOSIS — H409 Unspecified glaucoma: Secondary | ICD-10-CM | POA: Diagnosis not present

## 2013-07-30 DIAGNOSIS — H35379 Puckering of macula, unspecified eye: Secondary | ICD-10-CM | POA: Diagnosis not present

## 2013-08-13 DIAGNOSIS — H4011X0 Primary open-angle glaucoma, stage unspecified: Secondary | ICD-10-CM | POA: Diagnosis not present

## 2013-08-13 DIAGNOSIS — H409 Unspecified glaucoma: Secondary | ICD-10-CM | POA: Diagnosis not present

## 2013-09-04 DIAGNOSIS — L570 Actinic keratosis: Secondary | ICD-10-CM | POA: Diagnosis not present

## 2013-09-04 DIAGNOSIS — D235 Other benign neoplasm of skin of trunk: Secondary | ICD-10-CM | POA: Diagnosis not present

## 2013-09-04 DIAGNOSIS — Z85820 Personal history of malignant melanoma of skin: Secondary | ICD-10-CM | POA: Diagnosis not present

## 2013-09-20 ENCOUNTER — Other Ambulatory Visit: Payer: Self-pay | Admitting: Gastroenterology

## 2013-09-20 DIAGNOSIS — D126 Benign neoplasm of colon, unspecified: Secondary | ICD-10-CM | POA: Diagnosis not present

## 2013-09-20 DIAGNOSIS — Z86010 Personal history of colonic polyps: Secondary | ICD-10-CM | POA: Diagnosis not present

## 2013-09-20 DIAGNOSIS — Z09 Encounter for follow-up examination after completed treatment for conditions other than malignant neoplasm: Secondary | ICD-10-CM | POA: Diagnosis not present

## 2013-11-14 DIAGNOSIS — L570 Actinic keratosis: Secondary | ICD-10-CM | POA: Diagnosis not present

## 2014-01-09 DIAGNOSIS — E1142 Type 2 diabetes mellitus with diabetic polyneuropathy: Secondary | ICD-10-CM | POA: Diagnosis not present

## 2014-01-09 DIAGNOSIS — I10 Essential (primary) hypertension: Secondary | ICD-10-CM | POA: Diagnosis not present

## 2014-01-09 DIAGNOSIS — E1149 Type 2 diabetes mellitus with other diabetic neurological complication: Secondary | ICD-10-CM | POA: Diagnosis not present

## 2014-02-11 DIAGNOSIS — H35379 Puckering of macula, unspecified eye: Secondary | ICD-10-CM | POA: Diagnosis not present

## 2014-02-11 DIAGNOSIS — H4011X0 Primary open-angle glaucoma, stage unspecified: Secondary | ICD-10-CM | POA: Diagnosis not present

## 2014-02-11 DIAGNOSIS — H409 Unspecified glaucoma: Secondary | ICD-10-CM | POA: Diagnosis not present

## 2014-03-20 DIAGNOSIS — D485 Neoplasm of uncertain behavior of skin: Secondary | ICD-10-CM | POA: Diagnosis not present

## 2014-03-20 DIAGNOSIS — D0460 Carcinoma in situ of skin of unspecified upper limb, including shoulder: Secondary | ICD-10-CM | POA: Diagnosis not present

## 2014-03-20 DIAGNOSIS — L570 Actinic keratosis: Secondary | ICD-10-CM | POA: Diagnosis not present

## 2014-03-20 DIAGNOSIS — Z85820 Personal history of malignant melanoma of skin: Secondary | ICD-10-CM | POA: Diagnosis not present

## 2014-05-28 DIAGNOSIS — C434 Malignant melanoma of scalp and neck: Secondary | ICD-10-CM | POA: Diagnosis not present

## 2014-07-16 DIAGNOSIS — Z0000 Encounter for general adult medical examination without abnormal findings: Secondary | ICD-10-CM | POA: Diagnosis not present

## 2014-07-16 DIAGNOSIS — E785 Hyperlipidemia, unspecified: Secondary | ICD-10-CM | POA: Diagnosis not present

## 2014-07-16 DIAGNOSIS — I10 Essential (primary) hypertension: Secondary | ICD-10-CM | POA: Diagnosis not present

## 2014-07-16 DIAGNOSIS — E042 Nontoxic multinodular goiter: Secondary | ICD-10-CM | POA: Diagnosis not present

## 2014-07-16 DIAGNOSIS — E1142 Type 2 diabetes mellitus with diabetic polyneuropathy: Secondary | ICD-10-CM | POA: Diagnosis not present

## 2014-07-16 DIAGNOSIS — E039 Hypothyroidism, unspecified: Secondary | ICD-10-CM | POA: Diagnosis not present

## 2014-07-16 DIAGNOSIS — Z23 Encounter for immunization: Secondary | ICD-10-CM | POA: Diagnosis not present

## 2014-08-13 DIAGNOSIS — H4011X1 Primary open-angle glaucoma, mild stage: Secondary | ICD-10-CM | POA: Diagnosis not present

## 2014-08-13 DIAGNOSIS — H2513 Age-related nuclear cataract, bilateral: Secondary | ICD-10-CM | POA: Diagnosis not present

## 2014-09-20 DIAGNOSIS — E042 Nontoxic multinodular goiter: Secondary | ICD-10-CM | POA: Diagnosis not present

## 2014-10-07 DIAGNOSIS — J069 Acute upper respiratory infection, unspecified: Secondary | ICD-10-CM | POA: Diagnosis not present

## 2014-11-06 DIAGNOSIS — Z08 Encounter for follow-up examination after completed treatment for malignant neoplasm: Secondary | ICD-10-CM | POA: Diagnosis not present

## 2014-11-06 DIAGNOSIS — Z85820 Personal history of malignant melanoma of skin: Secondary | ICD-10-CM | POA: Diagnosis not present

## 2014-11-06 DIAGNOSIS — X32XXXD Exposure to sunlight, subsequent encounter: Secondary | ICD-10-CM | POA: Diagnosis not present

## 2014-11-06 DIAGNOSIS — L570 Actinic keratosis: Secondary | ICD-10-CM | POA: Diagnosis not present

## 2015-01-15 DIAGNOSIS — E1142 Type 2 diabetes mellitus with diabetic polyneuropathy: Secondary | ICD-10-CM | POA: Diagnosis not present

## 2015-01-15 DIAGNOSIS — E039 Hypothyroidism, unspecified: Secondary | ICD-10-CM | POA: Diagnosis not present

## 2015-01-15 DIAGNOSIS — I10 Essential (primary) hypertension: Secondary | ICD-10-CM | POA: Diagnosis not present

## 2015-02-13 DIAGNOSIS — H40053 Ocular hypertension, bilateral: Secondary | ICD-10-CM | POA: Diagnosis not present

## 2015-02-13 DIAGNOSIS — H2513 Age-related nuclear cataract, bilateral: Secondary | ICD-10-CM | POA: Diagnosis not present

## 2015-02-13 DIAGNOSIS — H35372 Puckering of macula, left eye: Secondary | ICD-10-CM | POA: Diagnosis not present

## 2015-04-25 DIAGNOSIS — D225 Melanocytic nevi of trunk: Secondary | ICD-10-CM | POA: Diagnosis not present

## 2015-04-25 DIAGNOSIS — Z1283 Encounter for screening for malignant neoplasm of skin: Secondary | ICD-10-CM | POA: Diagnosis not present

## 2015-04-25 DIAGNOSIS — Z85820 Personal history of malignant melanoma of skin: Secondary | ICD-10-CM | POA: Diagnosis not present

## 2015-04-25 DIAGNOSIS — D492 Neoplasm of unspecified behavior of bone, soft tissue, and skin: Secondary | ICD-10-CM | POA: Diagnosis not present

## 2015-04-25 DIAGNOSIS — L570 Actinic keratosis: Secondary | ICD-10-CM | POA: Diagnosis not present

## 2015-04-25 DIAGNOSIS — C44310 Basal cell carcinoma of skin of unspecified parts of face: Secondary | ICD-10-CM | POA: Diagnosis not present

## 2015-04-25 DIAGNOSIS — X32XXXD Exposure to sunlight, subsequent encounter: Secondary | ICD-10-CM | POA: Diagnosis not present

## 2015-04-25 DIAGNOSIS — Z08 Encounter for follow-up examination after completed treatment for malignant neoplasm: Secondary | ICD-10-CM | POA: Diagnosis not present

## 2015-05-20 DIAGNOSIS — X32XXXD Exposure to sunlight, subsequent encounter: Secondary | ICD-10-CM | POA: Diagnosis not present

## 2015-05-20 DIAGNOSIS — L570 Actinic keratosis: Secondary | ICD-10-CM | POA: Diagnosis not present

## 2015-05-20 DIAGNOSIS — L905 Scar conditions and fibrosis of skin: Secondary | ICD-10-CM | POA: Diagnosis not present

## 2015-05-20 DIAGNOSIS — C44310 Basal cell carcinoma of skin of unspecified parts of face: Secondary | ICD-10-CM | POA: Diagnosis not present

## 2015-06-03 DIAGNOSIS — Z6823 Body mass index (BMI) 23.0-23.9, adult: Secondary | ICD-10-CM | POA: Diagnosis not present

## 2015-06-03 DIAGNOSIS — C434 Malignant melanoma of scalp and neck: Secondary | ICD-10-CM | POA: Diagnosis not present

## 2015-07-25 DIAGNOSIS — I10 Essential (primary) hypertension: Secondary | ICD-10-CM | POA: Diagnosis not present

## 2015-07-25 DIAGNOSIS — E039 Hypothyroidism, unspecified: Secondary | ICD-10-CM | POA: Diagnosis not present

## 2015-07-25 DIAGNOSIS — E1140 Type 2 diabetes mellitus with diabetic neuropathy, unspecified: Secondary | ICD-10-CM | POA: Diagnosis not present

## 2015-07-25 DIAGNOSIS — Z1389 Encounter for screening for other disorder: Secondary | ICD-10-CM | POA: Diagnosis not present

## 2015-07-25 DIAGNOSIS — Z0000 Encounter for general adult medical examination without abnormal findings: Secondary | ICD-10-CM | POA: Diagnosis not present

## 2015-07-25 DIAGNOSIS — Z23 Encounter for immunization: Secondary | ICD-10-CM | POA: Diagnosis not present

## 2015-07-25 DIAGNOSIS — I779 Disorder of arteries and arterioles, unspecified: Secondary | ICD-10-CM | POA: Diagnosis not present

## 2015-08-12 DIAGNOSIS — K644 Residual hemorrhoidal skin tags: Secondary | ICD-10-CM | POA: Diagnosis not present

## 2015-08-12 DIAGNOSIS — K645 Perianal venous thrombosis: Secondary | ICD-10-CM | POA: Diagnosis not present

## 2015-08-14 DIAGNOSIS — H40051 Ocular hypertension, right eye: Secondary | ICD-10-CM | POA: Diagnosis not present

## 2015-08-14 DIAGNOSIS — H40052 Ocular hypertension, left eye: Secondary | ICD-10-CM | POA: Diagnosis not present

## 2015-10-08 DIAGNOSIS — L570 Actinic keratosis: Secondary | ICD-10-CM | POA: Diagnosis not present

## 2015-10-08 DIAGNOSIS — Z85820 Personal history of malignant melanoma of skin: Secondary | ICD-10-CM | POA: Diagnosis not present

## 2015-10-08 DIAGNOSIS — Z08 Encounter for follow-up examination after completed treatment for malignant neoplasm: Secondary | ICD-10-CM | POA: Diagnosis not present

## 2015-10-08 DIAGNOSIS — X32XXXD Exposure to sunlight, subsequent encounter: Secondary | ICD-10-CM | POA: Diagnosis not present

## 2016-01-22 DIAGNOSIS — I10 Essential (primary) hypertension: Secondary | ICD-10-CM | POA: Diagnosis not present

## 2016-01-22 DIAGNOSIS — E1140 Type 2 diabetes mellitus with diabetic neuropathy, unspecified: Secondary | ICD-10-CM | POA: Diagnosis not present

## 2016-02-16 DIAGNOSIS — H2513 Age-related nuclear cataract, bilateral: Secondary | ICD-10-CM | POA: Diagnosis not present

## 2016-02-16 DIAGNOSIS — H40051 Ocular hypertension, right eye: Secondary | ICD-10-CM | POA: Diagnosis not present

## 2016-02-16 DIAGNOSIS — H35372 Puckering of macula, left eye: Secondary | ICD-10-CM | POA: Diagnosis not present

## 2016-02-26 DIAGNOSIS — M542 Cervicalgia: Secondary | ICD-10-CM | POA: Diagnosis not present

## 2016-03-12 DIAGNOSIS — I10 Essential (primary) hypertension: Secondary | ICD-10-CM | POA: Diagnosis not present

## 2016-03-12 DIAGNOSIS — S46811D Strain of other muscles, fascia and tendons at shoulder and upper arm level, right arm, subsequent encounter: Secondary | ICD-10-CM | POA: Diagnosis not present

## 2016-03-12 DIAGNOSIS — M542 Cervicalgia: Secondary | ICD-10-CM | POA: Diagnosis not present

## 2016-03-17 DIAGNOSIS — H40051 Ocular hypertension, right eye: Secondary | ICD-10-CM | POA: Diagnosis not present

## 2016-03-26 ENCOUNTER — Other Ambulatory Visit: Payer: Self-pay | Admitting: Internal Medicine

## 2016-03-26 DIAGNOSIS — I10 Essential (primary) hypertension: Secondary | ICD-10-CM | POA: Diagnosis not present

## 2016-03-26 DIAGNOSIS — M5412 Radiculopathy, cervical region: Secondary | ICD-10-CM | POA: Diagnosis not present

## 2016-03-26 DIAGNOSIS — E559 Vitamin D deficiency, unspecified: Secondary | ICD-10-CM | POA: Diagnosis not present

## 2016-03-26 DIAGNOSIS — Z85820 Personal history of malignant melanoma of skin: Secondary | ICD-10-CM | POA: Diagnosis not present

## 2016-03-26 DIAGNOSIS — Z6824 Body mass index (BMI) 24.0-24.9, adult: Secondary | ICD-10-CM | POA: Diagnosis not present

## 2016-03-26 DIAGNOSIS — Z86010 Personal history of colonic polyps: Secondary | ICD-10-CM | POA: Diagnosis not present

## 2016-03-26 DIAGNOSIS — E038 Other specified hypothyroidism: Secondary | ICD-10-CM | POA: Diagnosis not present

## 2016-04-03 ENCOUNTER — Ambulatory Visit
Admission: RE | Admit: 2016-04-03 | Discharge: 2016-04-03 | Disposition: A | Discharge status: Home / Self Care | Payer: Medicare Other | Source: Ambulatory Visit | Attending: Internal Medicine | Admitting: Internal Medicine

## 2016-04-03 DIAGNOSIS — M50222 Other cervical disc displacement at C5-C6 level: Secondary | ICD-10-CM | POA: Diagnosis not present

## 2016-04-03 DIAGNOSIS — M5412 Radiculopathy, cervical region: Secondary | ICD-10-CM

## 2016-04-08 DIAGNOSIS — M5412 Radiculopathy, cervical region: Secondary | ICD-10-CM | POA: Diagnosis not present

## 2016-04-08 DIAGNOSIS — M4802 Spinal stenosis, cervical region: Secondary | ICD-10-CM | POA: Diagnosis not present

## 2016-04-08 DIAGNOSIS — Z6824 Body mass index (BMI) 24.0-24.9, adult: Secondary | ICD-10-CM | POA: Diagnosis not present

## 2016-04-14 DIAGNOSIS — M4722 Other spondylosis with radiculopathy, cervical region: Secondary | ICD-10-CM | POA: Diagnosis not present

## 2016-04-30 DIAGNOSIS — Z1283 Encounter for screening for malignant neoplasm of skin: Secondary | ICD-10-CM | POA: Diagnosis not present

## 2016-04-30 DIAGNOSIS — D485 Neoplasm of uncertain behavior of skin: Secondary | ICD-10-CM | POA: Diagnosis not present

## 2016-04-30 DIAGNOSIS — X32XXXD Exposure to sunlight, subsequent encounter: Secondary | ICD-10-CM | POA: Diagnosis not present

## 2016-04-30 DIAGNOSIS — Z85820 Personal history of malignant melanoma of skin: Secondary | ICD-10-CM | POA: Diagnosis not present

## 2016-04-30 DIAGNOSIS — D225 Melanocytic nevi of trunk: Secondary | ICD-10-CM | POA: Diagnosis not present

## 2016-04-30 DIAGNOSIS — L570 Actinic keratosis: Secondary | ICD-10-CM | POA: Diagnosis not present

## 2016-04-30 DIAGNOSIS — L814 Other melanin hyperpigmentation: Secondary | ICD-10-CM | POA: Diagnosis not present

## 2016-04-30 DIAGNOSIS — L820 Inflamed seborrheic keratosis: Secondary | ICD-10-CM | POA: Diagnosis not present

## 2016-04-30 DIAGNOSIS — Z08 Encounter for follow-up examination after completed treatment for malignant neoplasm: Secondary | ICD-10-CM | POA: Diagnosis not present

## 2016-05-14 DIAGNOSIS — L988 Other specified disorders of the skin and subcutaneous tissue: Secondary | ICD-10-CM | POA: Diagnosis not present

## 2016-05-14 DIAGNOSIS — D485 Neoplasm of uncertain behavior of skin: Secondary | ICD-10-CM | POA: Diagnosis not present

## 2016-05-28 DIAGNOSIS — Z1283 Encounter for screening for malignant neoplasm of skin: Secondary | ICD-10-CM | POA: Diagnosis not present

## 2016-05-28 DIAGNOSIS — C44329 Squamous cell carcinoma of skin of other parts of face: Secondary | ICD-10-CM | POA: Diagnosis not present

## 2016-05-28 DIAGNOSIS — L821 Other seborrheic keratosis: Secondary | ICD-10-CM | POA: Diagnosis not present

## 2016-05-28 DIAGNOSIS — L820 Inflamed seborrheic keratosis: Secondary | ICD-10-CM | POA: Diagnosis not present

## 2016-06-02 DIAGNOSIS — Z6823 Body mass index (BMI) 23.0-23.9, adult: Secondary | ICD-10-CM | POA: Diagnosis not present

## 2016-06-02 DIAGNOSIS — C434 Malignant melanoma of scalp and neck: Secondary | ICD-10-CM | POA: Diagnosis not present

## 2016-06-29 DIAGNOSIS — X32XXXD Exposure to sunlight, subsequent encounter: Secondary | ICD-10-CM | POA: Diagnosis not present

## 2016-06-29 DIAGNOSIS — Z08 Encounter for follow-up examination after completed treatment for malignant neoplasm: Secondary | ICD-10-CM | POA: Diagnosis not present

## 2016-06-29 DIAGNOSIS — Z85828 Personal history of other malignant neoplasm of skin: Secondary | ICD-10-CM | POA: Diagnosis not present

## 2016-06-29 DIAGNOSIS — L570 Actinic keratosis: Secondary | ICD-10-CM | POA: Diagnosis not present

## 2016-07-20 DIAGNOSIS — N390 Urinary tract infection, site not specified: Secondary | ICD-10-CM | POA: Diagnosis not present

## 2016-07-20 DIAGNOSIS — Z125 Encounter for screening for malignant neoplasm of prostate: Secondary | ICD-10-CM | POA: Diagnosis not present

## 2016-07-20 DIAGNOSIS — E559 Vitamin D deficiency, unspecified: Secondary | ICD-10-CM | POA: Diagnosis not present

## 2016-07-20 DIAGNOSIS — R8299 Other abnormal findings in urine: Secondary | ICD-10-CM | POA: Diagnosis not present

## 2016-07-20 DIAGNOSIS — I10 Essential (primary) hypertension: Secondary | ICD-10-CM | POA: Diagnosis not present

## 2016-07-20 DIAGNOSIS — E038 Other specified hypothyroidism: Secondary | ICD-10-CM | POA: Diagnosis not present

## 2016-07-27 DIAGNOSIS — Z6824 Body mass index (BMI) 24.0-24.9, adult: Secondary | ICD-10-CM | POA: Diagnosis not present

## 2016-07-27 DIAGNOSIS — R739 Hyperglycemia, unspecified: Secondary | ICD-10-CM | POA: Diagnosis not present

## 2016-07-27 DIAGNOSIS — I10 Essential (primary) hypertension: Secondary | ICD-10-CM | POA: Diagnosis not present

## 2016-07-27 DIAGNOSIS — E559 Vitamin D deficiency, unspecified: Secondary | ICD-10-CM | POA: Diagnosis not present

## 2016-07-27 DIAGNOSIS — Z1389 Encounter for screening for other disorder: Secondary | ICD-10-CM | POA: Diagnosis not present

## 2016-07-27 DIAGNOSIS — E038 Other specified hypothyroidism: Secondary | ICD-10-CM | POA: Diagnosis not present

## 2016-07-27 DIAGNOSIS — Z0000 Encounter for general adult medical examination without abnormal findings: Secondary | ICD-10-CM | POA: Diagnosis not present

## 2016-07-27 DIAGNOSIS — E781 Pure hyperglyceridemia: Secondary | ICD-10-CM | POA: Diagnosis not present

## 2016-07-27 DIAGNOSIS — Z23 Encounter for immunization: Secondary | ICD-10-CM | POA: Diagnosis not present

## 2016-07-27 DIAGNOSIS — M25511 Pain in right shoulder: Secondary | ICD-10-CM | POA: Diagnosis not present

## 2016-07-27 DIAGNOSIS — R9721 Rising PSA following treatment for malignant neoplasm of prostate: Secondary | ICD-10-CM | POA: Diagnosis not present

## 2016-08-12 DIAGNOSIS — H40051 Ocular hypertension, right eye: Secondary | ICD-10-CM | POA: Diagnosis not present

## 2016-08-18 DIAGNOSIS — R972 Elevated prostate specific antigen [PSA]: Secondary | ICD-10-CM | POA: Diagnosis not present

## 2016-08-18 DIAGNOSIS — N400 Enlarged prostate without lower urinary tract symptoms: Secondary | ICD-10-CM | POA: Diagnosis not present

## 2016-09-10 DIAGNOSIS — C44229 Squamous cell carcinoma of skin of left ear and external auricular canal: Secondary | ICD-10-CM | POA: Diagnosis not present

## 2016-09-10 DIAGNOSIS — Z08 Encounter for follow-up examination after completed treatment for malignant neoplasm: Secondary | ICD-10-CM | POA: Diagnosis not present

## 2016-09-10 DIAGNOSIS — C44222 Squamous cell carcinoma of skin of right ear and external auricular canal: Secondary | ICD-10-CM | POA: Diagnosis not present

## 2016-09-10 DIAGNOSIS — Z85828 Personal history of other malignant neoplasm of skin: Secondary | ICD-10-CM | POA: Diagnosis not present

## 2016-09-28 DIAGNOSIS — L570 Actinic keratosis: Secondary | ICD-10-CM | POA: Diagnosis not present

## 2016-09-28 DIAGNOSIS — X32XXXD Exposure to sunlight, subsequent encounter: Secondary | ICD-10-CM | POA: Diagnosis not present

## 2016-09-28 DIAGNOSIS — C44222 Squamous cell carcinoma of skin of right ear and external auricular canal: Secondary | ICD-10-CM | POA: Diagnosis not present

## 2016-09-28 DIAGNOSIS — Z85828 Personal history of other malignant neoplasm of skin: Secondary | ICD-10-CM | POA: Diagnosis not present

## 2016-09-28 DIAGNOSIS — Z08 Encounter for follow-up examination after completed treatment for malignant neoplasm: Secondary | ICD-10-CM | POA: Diagnosis not present

## 2016-10-01 DIAGNOSIS — R972 Elevated prostate specific antigen [PSA]: Secondary | ICD-10-CM | POA: Diagnosis not present

## 2016-11-17 DIAGNOSIS — R972 Elevated prostate specific antigen [PSA]: Secondary | ICD-10-CM | POA: Diagnosis not present

## 2017-02-04 DIAGNOSIS — Z85820 Personal history of malignant melanoma of skin: Secondary | ICD-10-CM | POA: Diagnosis not present

## 2017-02-04 DIAGNOSIS — Z08 Encounter for follow-up examination after completed treatment for malignant neoplasm: Secondary | ICD-10-CM | POA: Diagnosis not present

## 2017-02-04 DIAGNOSIS — C4362 Malignant melanoma of left upper limb, including shoulder: Secondary | ICD-10-CM | POA: Diagnosis not present

## 2017-02-04 DIAGNOSIS — X32XXXD Exposure to sunlight, subsequent encounter: Secondary | ICD-10-CM | POA: Diagnosis not present

## 2017-02-04 DIAGNOSIS — Z1283 Encounter for screening for malignant neoplasm of skin: Secondary | ICD-10-CM | POA: Diagnosis not present

## 2017-02-04 DIAGNOSIS — D225 Melanocytic nevi of trunk: Secondary | ICD-10-CM | POA: Diagnosis not present

## 2017-02-04 DIAGNOSIS — L570 Actinic keratosis: Secondary | ICD-10-CM | POA: Diagnosis not present

## 2017-02-09 DIAGNOSIS — E119 Type 2 diabetes mellitus without complications: Secondary | ICD-10-CM | POA: Diagnosis not present

## 2017-02-09 DIAGNOSIS — I10 Essential (primary) hypertension: Secondary | ICD-10-CM | POA: Diagnosis not present

## 2017-02-09 DIAGNOSIS — Z6824 Body mass index (BMI) 24.0-24.9, adult: Secondary | ICD-10-CM | POA: Diagnosis not present

## 2017-02-09 DIAGNOSIS — R972 Elevated prostate specific antigen [PSA]: Secondary | ICD-10-CM | POA: Diagnosis not present

## 2017-02-09 DIAGNOSIS — M25511 Pain in right shoulder: Secondary | ICD-10-CM | POA: Diagnosis not present

## 2017-02-09 DIAGNOSIS — E781 Pure hyperglyceridemia: Secondary | ICD-10-CM | POA: Diagnosis not present

## 2017-02-10 DIAGNOSIS — H2513 Age-related nuclear cataract, bilateral: Secondary | ICD-10-CM | POA: Diagnosis not present

## 2017-02-10 DIAGNOSIS — H40051 Ocular hypertension, right eye: Secondary | ICD-10-CM | POA: Diagnosis not present

## 2017-02-15 DIAGNOSIS — C4362 Malignant melanoma of left upper limb, including shoulder: Secondary | ICD-10-CM | POA: Diagnosis not present

## 2017-02-16 DIAGNOSIS — C4362 Malignant melanoma of left upper limb, including shoulder: Secondary | ICD-10-CM | POA: Diagnosis not present

## 2017-02-16 DIAGNOSIS — Z6823 Body mass index (BMI) 23.0-23.9, adult: Secondary | ICD-10-CM | POA: Diagnosis not present

## 2017-02-17 DIAGNOSIS — R972 Elevated prostate specific antigen [PSA]: Secondary | ICD-10-CM | POA: Diagnosis not present

## 2017-02-28 DIAGNOSIS — R972 Elevated prostate specific antigen [PSA]: Secondary | ICD-10-CM | POA: Diagnosis not present

## 2017-03-01 DIAGNOSIS — I10 Essential (primary) hypertension: Secondary | ICD-10-CM | POA: Diagnosis not present

## 2017-03-01 DIAGNOSIS — E785 Hyperlipidemia, unspecified: Secondary | ICD-10-CM | POA: Diagnosis not present

## 2017-03-01 DIAGNOSIS — Z85820 Personal history of malignant melanoma of skin: Secondary | ICD-10-CM | POA: Diagnosis not present

## 2017-03-01 DIAGNOSIS — Z79899 Other long term (current) drug therapy: Secondary | ICD-10-CM | POA: Diagnosis not present

## 2017-03-01 DIAGNOSIS — D0362 Melanoma in situ of left upper limb, including shoulder: Secondary | ICD-10-CM | POA: Diagnosis not present

## 2017-03-01 DIAGNOSIS — Z7982 Long term (current) use of aspirin: Secondary | ICD-10-CM | POA: Diagnosis not present

## 2017-03-01 DIAGNOSIS — C4362 Malignant melanoma of left upper limb, including shoulder: Secondary | ICD-10-CM | POA: Diagnosis not present

## 2017-03-22 DIAGNOSIS — C4362 Malignant melanoma of left upper limb, including shoulder: Secondary | ICD-10-CM | POA: Diagnosis not present

## 2017-03-22 DIAGNOSIS — Z09 Encounter for follow-up examination after completed treatment for conditions other than malignant neoplasm: Secondary | ICD-10-CM | POA: Diagnosis not present

## 2017-05-18 DIAGNOSIS — E559 Vitamin D deficiency, unspecified: Secondary | ICD-10-CM | POA: Diagnosis not present

## 2017-05-18 DIAGNOSIS — I10 Essential (primary) hypertension: Secondary | ICD-10-CM | POA: Diagnosis not present

## 2017-05-18 DIAGNOSIS — C439 Malignant melanoma of skin, unspecified: Secondary | ICD-10-CM | POA: Diagnosis not present

## 2017-05-18 DIAGNOSIS — E1136 Type 2 diabetes mellitus with diabetic cataract: Secondary | ICD-10-CM | POA: Diagnosis not present

## 2017-05-18 DIAGNOSIS — R972 Elevated prostate specific antigen [PSA]: Secondary | ICD-10-CM | POA: Diagnosis not present

## 2017-05-18 DIAGNOSIS — E781 Pure hyperglyceridemia: Secondary | ICD-10-CM | POA: Diagnosis not present

## 2017-05-18 DIAGNOSIS — E038 Other specified hypothyroidism: Secondary | ICD-10-CM | POA: Diagnosis not present

## 2017-05-18 DIAGNOSIS — Z6823 Body mass index (BMI) 23.0-23.9, adult: Secondary | ICD-10-CM | POA: Diagnosis not present

## 2017-05-24 DIAGNOSIS — C4362 Malignant melanoma of left upper limb, including shoulder: Secondary | ICD-10-CM | POA: Diagnosis not present

## 2017-05-24 DIAGNOSIS — Z6822 Body mass index (BMI) 22.0-22.9, adult: Secondary | ICD-10-CM | POA: Diagnosis not present

## 2017-06-01 DIAGNOSIS — L570 Actinic keratosis: Secondary | ICD-10-CM | POA: Diagnosis not present

## 2017-06-01 DIAGNOSIS — X32XXXD Exposure to sunlight, subsequent encounter: Secondary | ICD-10-CM | POA: Diagnosis not present

## 2017-06-01 DIAGNOSIS — Z85820 Personal history of malignant melanoma of skin: Secondary | ICD-10-CM | POA: Diagnosis not present

## 2017-06-01 DIAGNOSIS — Z08 Encounter for follow-up examination after completed treatment for malignant neoplasm: Secondary | ICD-10-CM | POA: Diagnosis not present

## 2017-06-01 DIAGNOSIS — C44629 Squamous cell carcinoma of skin of left upper limb, including shoulder: Secondary | ICD-10-CM | POA: Diagnosis not present

## 2017-06-01 DIAGNOSIS — Z1283 Encounter for screening for malignant neoplasm of skin: Secondary | ICD-10-CM | POA: Diagnosis not present

## 2017-06-01 DIAGNOSIS — D225 Melanocytic nevi of trunk: Secondary | ICD-10-CM | POA: Diagnosis not present

## 2017-06-03 DIAGNOSIS — Z08 Encounter for follow-up examination after completed treatment for malignant neoplasm: Secondary | ICD-10-CM | POA: Diagnosis not present

## 2017-06-03 DIAGNOSIS — Z85828 Personal history of other malignant neoplasm of skin: Secondary | ICD-10-CM | POA: Diagnosis not present

## 2017-06-15 DIAGNOSIS — C4362 Malignant melanoma of left upper limb, including shoulder: Secondary | ICD-10-CM | POA: Diagnosis not present

## 2017-06-15 DIAGNOSIS — C434 Malignant melanoma of scalp and neck: Secondary | ICD-10-CM | POA: Diagnosis not present

## 2017-06-17 DIAGNOSIS — Z85828 Personal history of other malignant neoplasm of skin: Secondary | ICD-10-CM | POA: Diagnosis not present

## 2017-06-17 DIAGNOSIS — Z08 Encounter for follow-up examination after completed treatment for malignant neoplasm: Secondary | ICD-10-CM | POA: Diagnosis not present

## 2017-06-25 DIAGNOSIS — Z23 Encounter for immunization: Secondary | ICD-10-CM | POA: Diagnosis not present

## 2017-07-26 DIAGNOSIS — E781 Pure hyperglyceridemia: Secondary | ICD-10-CM | POA: Diagnosis not present

## 2017-07-26 DIAGNOSIS — E1136 Type 2 diabetes mellitus with diabetic cataract: Secondary | ICD-10-CM | POA: Diagnosis not present

## 2017-07-26 DIAGNOSIS — E038 Other specified hypothyroidism: Secondary | ICD-10-CM | POA: Diagnosis not present

## 2017-07-26 DIAGNOSIS — R82998 Other abnormal findings in urine: Secondary | ICD-10-CM | POA: Diagnosis not present

## 2017-07-26 DIAGNOSIS — I10 Essential (primary) hypertension: Secondary | ICD-10-CM | POA: Diagnosis not present

## 2017-07-26 DIAGNOSIS — E559 Vitamin D deficiency, unspecified: Secondary | ICD-10-CM | POA: Diagnosis not present

## 2017-08-02 DIAGNOSIS — Z1389 Encounter for screening for other disorder: Secondary | ICD-10-CM | POA: Diagnosis not present

## 2017-08-02 DIAGNOSIS — C439 Malignant melanoma of skin, unspecified: Secondary | ICD-10-CM | POA: Diagnosis not present

## 2017-08-02 DIAGNOSIS — Z86010 Personal history of colonic polyps: Secondary | ICD-10-CM | POA: Diagnosis not present

## 2017-08-02 DIAGNOSIS — R972 Elevated prostate specific antigen [PSA]: Secondary | ICD-10-CM | POA: Diagnosis not present

## 2017-08-02 DIAGNOSIS — Z0000 Encounter for general adult medical examination without abnormal findings: Secondary | ICD-10-CM | POA: Diagnosis not present

## 2017-08-02 DIAGNOSIS — E1136 Type 2 diabetes mellitus with diabetic cataract: Secondary | ICD-10-CM | POA: Diagnosis not present

## 2017-08-02 DIAGNOSIS — I10 Essential (primary) hypertension: Secondary | ICD-10-CM | POA: Diagnosis not present

## 2017-08-02 DIAGNOSIS — Z6823 Body mass index (BMI) 23.0-23.9, adult: Secondary | ICD-10-CM | POA: Diagnosis not present

## 2017-08-02 DIAGNOSIS — E781 Pure hyperglyceridemia: Secondary | ICD-10-CM | POA: Diagnosis not present

## 2017-08-02 DIAGNOSIS — E559 Vitamin D deficiency, unspecified: Secondary | ICD-10-CM | POA: Diagnosis not present

## 2017-08-02 DIAGNOSIS — E038 Other specified hypothyroidism: Secondary | ICD-10-CM | POA: Diagnosis not present

## 2017-08-05 DIAGNOSIS — Z1212 Encounter for screening for malignant neoplasm of rectum: Secondary | ICD-10-CM | POA: Diagnosis not present

## 2017-08-11 DIAGNOSIS — H40051 Ocular hypertension, right eye: Secondary | ICD-10-CM | POA: Diagnosis not present

## 2017-08-24 DIAGNOSIS — R972 Elevated prostate specific antigen [PSA]: Secondary | ICD-10-CM | POA: Diagnosis not present

## 2017-08-31 DIAGNOSIS — R351 Nocturia: Secondary | ICD-10-CM | POA: Diagnosis not present

## 2017-08-31 DIAGNOSIS — R972 Elevated prostate specific antigen [PSA]: Secondary | ICD-10-CM | POA: Diagnosis not present

## 2017-10-19 DIAGNOSIS — Z08 Encounter for follow-up examination after completed treatment for malignant neoplasm: Secondary | ICD-10-CM | POA: Diagnosis not present

## 2017-10-19 DIAGNOSIS — L570 Actinic keratosis: Secondary | ICD-10-CM | POA: Diagnosis not present

## 2017-10-19 DIAGNOSIS — Z1283 Encounter for screening for malignant neoplasm of skin: Secondary | ICD-10-CM | POA: Diagnosis not present

## 2017-10-19 DIAGNOSIS — X32XXXD Exposure to sunlight, subsequent encounter: Secondary | ICD-10-CM | POA: Diagnosis not present

## 2017-10-19 DIAGNOSIS — Z85820 Personal history of malignant melanoma of skin: Secondary | ICD-10-CM | POA: Diagnosis not present

## 2017-11-29 DIAGNOSIS — E1136 Type 2 diabetes mellitus with diabetic cataract: Secondary | ICD-10-CM | POA: Diagnosis not present

## 2017-11-29 DIAGNOSIS — Z6823 Body mass index (BMI) 23.0-23.9, adult: Secondary | ICD-10-CM | POA: Diagnosis not present

## 2017-11-29 DIAGNOSIS — I10 Essential (primary) hypertension: Secondary | ICD-10-CM | POA: Diagnosis not present

## 2017-11-29 DIAGNOSIS — R972 Elevated prostate specific antigen [PSA]: Secondary | ICD-10-CM | POA: Diagnosis not present

## 2017-11-29 DIAGNOSIS — E038 Other specified hypothyroidism: Secondary | ICD-10-CM | POA: Diagnosis not present

## 2018-01-23 DIAGNOSIS — Z08 Encounter for follow-up examination after completed treatment for malignant neoplasm: Secondary | ICD-10-CM | POA: Diagnosis not present

## 2018-01-23 DIAGNOSIS — D044 Carcinoma in situ of skin of scalp and neck: Secondary | ICD-10-CM | POA: Diagnosis not present

## 2018-01-23 DIAGNOSIS — X32XXXD Exposure to sunlight, subsequent encounter: Secondary | ICD-10-CM | POA: Diagnosis not present

## 2018-01-23 DIAGNOSIS — Z85820 Personal history of malignant melanoma of skin: Secondary | ICD-10-CM | POA: Diagnosis not present

## 2018-01-23 DIAGNOSIS — Z1283 Encounter for screening for malignant neoplasm of skin: Secondary | ICD-10-CM | POA: Diagnosis not present

## 2018-01-23 DIAGNOSIS — L570 Actinic keratosis: Secondary | ICD-10-CM | POA: Diagnosis not present

## 2018-02-16 DIAGNOSIS — H40053 Ocular hypertension, bilateral: Secondary | ICD-10-CM | POA: Diagnosis not present

## 2018-02-16 DIAGNOSIS — H35372 Puckering of macula, left eye: Secondary | ICD-10-CM | POA: Diagnosis not present

## 2018-02-16 DIAGNOSIS — H2513 Age-related nuclear cataract, bilateral: Secondary | ICD-10-CM | POA: Diagnosis not present

## 2018-03-01 DIAGNOSIS — R972 Elevated prostate specific antigen [PSA]: Secondary | ICD-10-CM | POA: Diagnosis not present

## 2018-03-06 DIAGNOSIS — N400 Enlarged prostate without lower urinary tract symptoms: Secondary | ICD-10-CM | POA: Diagnosis not present

## 2018-03-06 DIAGNOSIS — R972 Elevated prostate specific antigen [PSA]: Secondary | ICD-10-CM | POA: Diagnosis not present

## 2018-03-29 DIAGNOSIS — E038 Other specified hypothyroidism: Secondary | ICD-10-CM | POA: Diagnosis not present

## 2018-03-29 DIAGNOSIS — K5909 Other constipation: Secondary | ICD-10-CM | POA: Diagnosis not present

## 2018-03-29 DIAGNOSIS — E1169 Type 2 diabetes mellitus with other specified complication: Secondary | ICD-10-CM | POA: Diagnosis not present

## 2018-03-29 DIAGNOSIS — I10 Essential (primary) hypertension: Secondary | ICD-10-CM | POA: Diagnosis not present

## 2018-03-29 DIAGNOSIS — Z6823 Body mass index (BMI) 23.0-23.9, adult: Secondary | ICD-10-CM | POA: Diagnosis not present

## 2018-04-27 DIAGNOSIS — Z08 Encounter for follow-up examination after completed treatment for malignant neoplasm: Secondary | ICD-10-CM | POA: Diagnosis not present

## 2018-04-27 DIAGNOSIS — Z1283 Encounter for screening for malignant neoplasm of skin: Secondary | ICD-10-CM | POA: Diagnosis not present

## 2018-04-27 DIAGNOSIS — X32XXXD Exposure to sunlight, subsequent encounter: Secondary | ICD-10-CM | POA: Diagnosis not present

## 2018-04-27 DIAGNOSIS — D2261 Melanocytic nevi of right upper limb, including shoulder: Secondary | ICD-10-CM | POA: Diagnosis not present

## 2018-04-27 DIAGNOSIS — D044 Carcinoma in situ of skin of scalp and neck: Secondary | ICD-10-CM | POA: Diagnosis not present

## 2018-04-27 DIAGNOSIS — Z85820 Personal history of malignant melanoma of skin: Secondary | ICD-10-CM | POA: Diagnosis not present

## 2018-04-27 DIAGNOSIS — D485 Neoplasm of uncertain behavior of skin: Secondary | ICD-10-CM | POA: Diagnosis not present

## 2018-04-27 DIAGNOSIS — D225 Melanocytic nevi of trunk: Secondary | ICD-10-CM | POA: Diagnosis not present

## 2018-04-27 DIAGNOSIS — L570 Actinic keratosis: Secondary | ICD-10-CM | POA: Diagnosis not present

## 2018-05-30 DIAGNOSIS — C4362 Malignant melanoma of left upper limb, including shoulder: Secondary | ICD-10-CM | POA: Diagnosis not present

## 2018-05-30 DIAGNOSIS — Z6823 Body mass index (BMI) 23.0-23.9, adult: Secondary | ICD-10-CM | POA: Diagnosis not present

## 2018-05-30 DIAGNOSIS — C434 Malignant melanoma of scalp and neck: Secondary | ICD-10-CM | POA: Diagnosis not present

## 2018-07-28 DIAGNOSIS — E559 Vitamin D deficiency, unspecified: Secondary | ICD-10-CM | POA: Diagnosis not present

## 2018-07-28 DIAGNOSIS — R82998 Other abnormal findings in urine: Secondary | ICD-10-CM | POA: Diagnosis not present

## 2018-07-28 DIAGNOSIS — I10 Essential (primary) hypertension: Secondary | ICD-10-CM | POA: Diagnosis not present

## 2018-07-28 DIAGNOSIS — E1169 Type 2 diabetes mellitus with other specified complication: Secondary | ICD-10-CM | POA: Diagnosis not present

## 2018-07-28 DIAGNOSIS — Z125 Encounter for screening for malignant neoplasm of prostate: Secondary | ICD-10-CM | POA: Diagnosis not present

## 2018-07-28 DIAGNOSIS — E038 Other specified hypothyroidism: Secondary | ICD-10-CM | POA: Diagnosis not present

## 2018-07-28 DIAGNOSIS — E781 Pure hyperglyceridemia: Secondary | ICD-10-CM | POA: Diagnosis not present

## 2018-08-04 DIAGNOSIS — R972 Elevated prostate specific antigen [PSA]: Secondary | ICD-10-CM | POA: Diagnosis not present

## 2018-08-04 DIAGNOSIS — I10 Essential (primary) hypertension: Secondary | ICD-10-CM | POA: Diagnosis not present

## 2018-08-04 DIAGNOSIS — E1136 Type 2 diabetes mellitus with diabetic cataract: Secondary | ICD-10-CM | POA: Diagnosis not present

## 2018-08-04 DIAGNOSIS — C439 Malignant melanoma of skin, unspecified: Secondary | ICD-10-CM | POA: Diagnosis not present

## 2018-08-04 DIAGNOSIS — Z1389 Encounter for screening for other disorder: Secondary | ICD-10-CM | POA: Diagnosis not present

## 2018-08-04 DIAGNOSIS — E1169 Type 2 diabetes mellitus with other specified complication: Secondary | ICD-10-CM | POA: Diagnosis not present

## 2018-08-04 DIAGNOSIS — E7849 Other hyperlipidemia: Secondary | ICD-10-CM | POA: Diagnosis not present

## 2018-08-04 DIAGNOSIS — E038 Other specified hypothyroidism: Secondary | ICD-10-CM | POA: Diagnosis not present

## 2018-08-04 DIAGNOSIS — Z6823 Body mass index (BMI) 23.0-23.9, adult: Secondary | ICD-10-CM | POA: Diagnosis not present

## 2018-08-04 DIAGNOSIS — Z0000 Encounter for general adult medical examination without abnormal findings: Secondary | ICD-10-CM | POA: Diagnosis not present

## 2018-08-04 DIAGNOSIS — E559 Vitamin D deficiency, unspecified: Secondary | ICD-10-CM | POA: Diagnosis not present

## 2018-08-04 DIAGNOSIS — H40033 Anatomical narrow angle, bilateral: Secondary | ICD-10-CM | POA: Diagnosis not present

## 2018-08-14 DIAGNOSIS — E119 Type 2 diabetes mellitus without complications: Secondary | ICD-10-CM | POA: Diagnosis not present

## 2018-08-14 DIAGNOSIS — H524 Presbyopia: Secondary | ICD-10-CM | POA: Diagnosis not present

## 2018-08-14 DIAGNOSIS — H40053 Ocular hypertension, bilateral: Secondary | ICD-10-CM | POA: Diagnosis not present

## 2018-08-14 DIAGNOSIS — H25013 Cortical age-related cataract, bilateral: Secondary | ICD-10-CM | POA: Diagnosis not present

## 2018-08-21 DIAGNOSIS — R972 Elevated prostate specific antigen [PSA]: Secondary | ICD-10-CM | POA: Diagnosis not present

## 2018-08-28 DIAGNOSIS — N400 Enlarged prostate without lower urinary tract symptoms: Secondary | ICD-10-CM | POA: Diagnosis not present

## 2018-08-28 DIAGNOSIS — R972 Elevated prostate specific antigen [PSA]: Secondary | ICD-10-CM | POA: Diagnosis not present

## 2018-09-22 DIAGNOSIS — C4442 Squamous cell carcinoma of skin of scalp and neck: Secondary | ICD-10-CM | POA: Diagnosis not present

## 2018-09-22 DIAGNOSIS — L570 Actinic keratosis: Secondary | ICD-10-CM | POA: Diagnosis not present

## 2018-09-22 DIAGNOSIS — C44629 Squamous cell carcinoma of skin of left upper limb, including shoulder: Secondary | ICD-10-CM | POA: Diagnosis not present

## 2018-09-22 DIAGNOSIS — Z85820 Personal history of malignant melanoma of skin: Secondary | ICD-10-CM | POA: Diagnosis not present

## 2018-09-22 DIAGNOSIS — X32XXXD Exposure to sunlight, subsequent encounter: Secondary | ICD-10-CM | POA: Diagnosis not present

## 2018-09-22 DIAGNOSIS — Z08 Encounter for follow-up examination after completed treatment for malignant neoplasm: Secondary | ICD-10-CM | POA: Diagnosis not present

## 2018-09-22 DIAGNOSIS — D225 Melanocytic nevi of trunk: Secondary | ICD-10-CM | POA: Diagnosis not present

## 2018-09-22 DIAGNOSIS — Z1283 Encounter for screening for malignant neoplasm of skin: Secondary | ICD-10-CM | POA: Diagnosis not present

## 2018-11-03 DIAGNOSIS — L821 Other seborrheic keratosis: Secondary | ICD-10-CM | POA: Diagnosis not present

## 2018-11-03 DIAGNOSIS — Z08 Encounter for follow-up examination after completed treatment for malignant neoplasm: Secondary | ICD-10-CM | POA: Diagnosis not present

## 2018-11-03 DIAGNOSIS — Z85828 Personal history of other malignant neoplasm of skin: Secondary | ICD-10-CM | POA: Diagnosis not present

## 2018-11-27 DIAGNOSIS — R972 Elevated prostate specific antigen [PSA]: Secondary | ICD-10-CM | POA: Diagnosis not present

## 2019-02-12 DIAGNOSIS — E1169 Type 2 diabetes mellitus with other specified complication: Secondary | ICD-10-CM | POA: Diagnosis not present

## 2019-02-14 DIAGNOSIS — E039 Hypothyroidism, unspecified: Secondary | ICD-10-CM | POA: Diagnosis not present

## 2019-02-14 DIAGNOSIS — I10 Essential (primary) hypertension: Secondary | ICD-10-CM | POA: Diagnosis not present

## 2019-02-14 DIAGNOSIS — R972 Elevated prostate specific antigen [PSA]: Secondary | ICD-10-CM | POA: Diagnosis not present

## 2019-02-14 DIAGNOSIS — E1169 Type 2 diabetes mellitus with other specified complication: Secondary | ICD-10-CM | POA: Diagnosis not present

## 2019-02-21 DIAGNOSIS — R972 Elevated prostate specific antigen [PSA]: Secondary | ICD-10-CM | POA: Diagnosis not present

## 2019-02-26 DIAGNOSIS — R972 Elevated prostate specific antigen [PSA]: Secondary | ICD-10-CM | POA: Diagnosis not present

## 2019-02-26 DIAGNOSIS — N400 Enlarged prostate without lower urinary tract symptoms: Secondary | ICD-10-CM | POA: Diagnosis not present

## 2019-04-06 DIAGNOSIS — H25013 Cortical age-related cataract, bilateral: Secondary | ICD-10-CM | POA: Diagnosis not present

## 2019-04-06 DIAGNOSIS — H40053 Ocular hypertension, bilateral: Secondary | ICD-10-CM | POA: Diagnosis not present

## 2019-04-06 DIAGNOSIS — H2513 Age-related nuclear cataract, bilateral: Secondary | ICD-10-CM | POA: Diagnosis not present

## 2019-05-08 DIAGNOSIS — H25012 Cortical age-related cataract, left eye: Secondary | ICD-10-CM | POA: Diagnosis not present

## 2019-05-08 DIAGNOSIS — H2512 Age-related nuclear cataract, left eye: Secondary | ICD-10-CM | POA: Diagnosis not present

## 2019-05-08 DIAGNOSIS — H25812 Combined forms of age-related cataract, left eye: Secondary | ICD-10-CM | POA: Diagnosis not present

## 2019-06-12 DIAGNOSIS — X32XXXD Exposure to sunlight, subsequent encounter: Secondary | ICD-10-CM | POA: Diagnosis not present

## 2019-06-12 DIAGNOSIS — D044 Carcinoma in situ of skin of scalp and neck: Secondary | ICD-10-CM | POA: Diagnosis not present

## 2019-06-12 DIAGNOSIS — D225 Melanocytic nevi of trunk: Secondary | ICD-10-CM | POA: Diagnosis not present

## 2019-06-12 DIAGNOSIS — Z08 Encounter for follow-up examination after completed treatment for malignant neoplasm: Secondary | ICD-10-CM | POA: Diagnosis not present

## 2019-06-12 DIAGNOSIS — Z1283 Encounter for screening for malignant neoplasm of skin: Secondary | ICD-10-CM | POA: Diagnosis not present

## 2019-06-12 DIAGNOSIS — L820 Inflamed seborrheic keratosis: Secondary | ICD-10-CM | POA: Diagnosis not present

## 2019-06-12 DIAGNOSIS — L570 Actinic keratosis: Secondary | ICD-10-CM | POA: Diagnosis not present

## 2019-06-12 DIAGNOSIS — Z85820 Personal history of malignant melanoma of skin: Secondary | ICD-10-CM | POA: Diagnosis not present

## 2019-07-24 DIAGNOSIS — X32XXXD Exposure to sunlight, subsequent encounter: Secondary | ICD-10-CM | POA: Diagnosis not present

## 2019-07-24 DIAGNOSIS — Z85828 Personal history of other malignant neoplasm of skin: Secondary | ICD-10-CM | POA: Diagnosis not present

## 2019-07-24 DIAGNOSIS — C44319 Basal cell carcinoma of skin of other parts of face: Secondary | ICD-10-CM | POA: Diagnosis not present

## 2019-07-24 DIAGNOSIS — Z08 Encounter for follow-up examination after completed treatment for malignant neoplasm: Secondary | ICD-10-CM | POA: Diagnosis not present

## 2019-07-24 DIAGNOSIS — L820 Inflamed seborrheic keratosis: Secondary | ICD-10-CM | POA: Diagnosis not present

## 2019-07-24 DIAGNOSIS — L570 Actinic keratosis: Secondary | ICD-10-CM | POA: Diagnosis not present

## 2019-07-31 DIAGNOSIS — E7849 Other hyperlipidemia: Secondary | ICD-10-CM | POA: Diagnosis not present

## 2019-07-31 DIAGNOSIS — E1169 Type 2 diabetes mellitus with other specified complication: Secondary | ICD-10-CM | POA: Diagnosis not present

## 2019-07-31 DIAGNOSIS — M4802 Spinal stenosis, cervical region: Secondary | ICD-10-CM | POA: Diagnosis not present

## 2019-07-31 DIAGNOSIS — Z125 Encounter for screening for malignant neoplasm of prostate: Secondary | ICD-10-CM | POA: Diagnosis not present

## 2019-07-31 DIAGNOSIS — E559 Vitamin D deficiency, unspecified: Secondary | ICD-10-CM | POA: Diagnosis not present

## 2019-07-31 DIAGNOSIS — E038 Other specified hypothyroidism: Secondary | ICD-10-CM | POA: Diagnosis not present

## 2019-08-07 DIAGNOSIS — E1136 Type 2 diabetes mellitus with diabetic cataract: Secondary | ICD-10-CM | POA: Diagnosis not present

## 2019-08-07 DIAGNOSIS — Z86010 Personal history of colonic polyps: Secondary | ICD-10-CM | POA: Diagnosis not present

## 2019-08-07 DIAGNOSIS — Z1331 Encounter for screening for depression: Secondary | ICD-10-CM | POA: Diagnosis not present

## 2019-08-07 DIAGNOSIS — E785 Hyperlipidemia, unspecified: Secondary | ICD-10-CM | POA: Diagnosis not present

## 2019-08-07 DIAGNOSIS — E039 Hypothyroidism, unspecified: Secondary | ICD-10-CM | POA: Diagnosis not present

## 2019-08-07 DIAGNOSIS — R972 Elevated prostate specific antigen [PSA]: Secondary | ICD-10-CM | POA: Diagnosis not present

## 2019-08-07 DIAGNOSIS — E1169 Type 2 diabetes mellitus with other specified complication: Secondary | ICD-10-CM | POA: Diagnosis not present

## 2019-08-07 DIAGNOSIS — Z0000 Encounter for general adult medical examination without abnormal findings: Secondary | ICD-10-CM | POA: Diagnosis not present

## 2019-08-07 DIAGNOSIS — R82998 Other abnormal findings in urine: Secondary | ICD-10-CM | POA: Diagnosis not present

## 2019-08-07 DIAGNOSIS — E559 Vitamin D deficiency, unspecified: Secondary | ICD-10-CM | POA: Diagnosis not present

## 2019-08-28 DIAGNOSIS — L570 Actinic keratosis: Secondary | ICD-10-CM | POA: Diagnosis not present

## 2019-08-28 DIAGNOSIS — L820 Inflamed seborrheic keratosis: Secondary | ICD-10-CM | POA: Diagnosis not present

## 2019-08-28 DIAGNOSIS — Z08 Encounter for follow-up examination after completed treatment for malignant neoplasm: Secondary | ICD-10-CM | POA: Diagnosis not present

## 2019-08-28 DIAGNOSIS — Z85828 Personal history of other malignant neoplasm of skin: Secondary | ICD-10-CM | POA: Diagnosis not present

## 2019-08-28 DIAGNOSIS — C4441 Basal cell carcinoma of skin of scalp and neck: Secondary | ICD-10-CM | POA: Diagnosis not present

## 2019-08-28 DIAGNOSIS — X32XXXD Exposure to sunlight, subsequent encounter: Secondary | ICD-10-CM | POA: Diagnosis not present

## 2019-08-28 DIAGNOSIS — D044 Carcinoma in situ of skin of scalp and neck: Secondary | ICD-10-CM | POA: Diagnosis not present

## 2019-09-12 DIAGNOSIS — R972 Elevated prostate specific antigen [PSA]: Secondary | ICD-10-CM | POA: Diagnosis not present

## 2019-09-19 DIAGNOSIS — R972 Elevated prostate specific antigen [PSA]: Secondary | ICD-10-CM | POA: Diagnosis not present

## 2019-09-19 DIAGNOSIS — N400 Enlarged prostate without lower urinary tract symptoms: Secondary | ICD-10-CM | POA: Diagnosis not present

## 2019-10-09 DIAGNOSIS — C4441 Basal cell carcinoma of skin of scalp and neck: Secondary | ICD-10-CM | POA: Diagnosis not present

## 2019-10-09 DIAGNOSIS — X32XXXD Exposure to sunlight, subsequent encounter: Secondary | ICD-10-CM | POA: Diagnosis not present

## 2019-10-09 DIAGNOSIS — Z08 Encounter for follow-up examination after completed treatment for malignant neoplasm: Secondary | ICD-10-CM | POA: Diagnosis not present

## 2019-10-09 DIAGNOSIS — Z85828 Personal history of other malignant neoplasm of skin: Secondary | ICD-10-CM | POA: Diagnosis not present

## 2019-10-09 DIAGNOSIS — L570 Actinic keratosis: Secondary | ICD-10-CM | POA: Diagnosis not present

## 2019-12-03 DIAGNOSIS — H40053 Ocular hypertension, bilateral: Secondary | ICD-10-CM | POA: Diagnosis not present

## 2019-12-03 DIAGNOSIS — Z961 Presence of intraocular lens: Secondary | ICD-10-CM | POA: Diagnosis not present

## 2019-12-04 DIAGNOSIS — I38 Endocarditis, valve unspecified: Secondary | ICD-10-CM | POA: Diagnosis not present

## 2019-12-04 DIAGNOSIS — I10 Essential (primary) hypertension: Secondary | ICD-10-CM | POA: Diagnosis not present

## 2019-12-04 DIAGNOSIS — E039 Hypothyroidism, unspecified: Secondary | ICD-10-CM | POA: Diagnosis not present

## 2019-12-04 DIAGNOSIS — E785 Hyperlipidemia, unspecified: Secondary | ICD-10-CM | POA: Diagnosis not present

## 2019-12-04 DIAGNOSIS — E1169 Type 2 diabetes mellitus with other specified complication: Secondary | ICD-10-CM | POA: Diagnosis not present

## 2019-12-19 DIAGNOSIS — R972 Elevated prostate specific antigen [PSA]: Secondary | ICD-10-CM | POA: Diagnosis not present

## 2019-12-25 DIAGNOSIS — L821 Other seborrheic keratosis: Secondary | ICD-10-CM | POA: Diagnosis not present

## 2019-12-25 DIAGNOSIS — Z85820 Personal history of malignant melanoma of skin: Secondary | ICD-10-CM | POA: Diagnosis not present

## 2019-12-25 DIAGNOSIS — X32XXXD Exposure to sunlight, subsequent encounter: Secondary | ICD-10-CM | POA: Diagnosis not present

## 2019-12-25 DIAGNOSIS — L570 Actinic keratosis: Secondary | ICD-10-CM | POA: Diagnosis not present

## 2019-12-25 DIAGNOSIS — Z1283 Encounter for screening for malignant neoplasm of skin: Secondary | ICD-10-CM | POA: Diagnosis not present

## 2019-12-25 DIAGNOSIS — Z08 Encounter for follow-up examination after completed treatment for malignant neoplasm: Secondary | ICD-10-CM | POA: Diagnosis not present

## 2020-01-24 DIAGNOSIS — E038 Other specified hypothyroidism: Secondary | ICD-10-CM | POA: Diagnosis not present

## 2020-01-29 DIAGNOSIS — E038 Other specified hypothyroidism: Secondary | ICD-10-CM | POA: Diagnosis not present

## 2020-01-29 DIAGNOSIS — E039 Hypothyroidism, unspecified: Secondary | ICD-10-CM | POA: Diagnosis not present

## 2020-04-23 DIAGNOSIS — J329 Chronic sinusitis, unspecified: Secondary | ICD-10-CM | POA: Diagnosis not present

## 2020-04-23 DIAGNOSIS — N433 Hydrocele, unspecified: Secondary | ICD-10-CM | POA: Diagnosis not present

## 2020-04-23 DIAGNOSIS — I10 Essential (primary) hypertension: Secondary | ICD-10-CM | POA: Diagnosis not present

## 2020-04-23 DIAGNOSIS — E039 Hypothyroidism, unspecified: Secondary | ICD-10-CM | POA: Diagnosis not present

## 2020-04-23 DIAGNOSIS — E785 Hyperlipidemia, unspecified: Secondary | ICD-10-CM | POA: Diagnosis not present

## 2020-04-23 DIAGNOSIS — E1169 Type 2 diabetes mellitus with other specified complication: Secondary | ICD-10-CM | POA: Diagnosis not present

## 2020-04-23 DIAGNOSIS — R972 Elevated prostate specific antigen [PSA]: Secondary | ICD-10-CM | POA: Diagnosis not present

## 2020-05-06 DIAGNOSIS — R972 Elevated prostate specific antigen [PSA]: Secondary | ICD-10-CM | POA: Diagnosis not present

## 2020-05-06 DIAGNOSIS — N430 Encysted hydrocele: Secondary | ICD-10-CM | POA: Diagnosis not present

## 2020-05-22 DIAGNOSIS — R109 Unspecified abdominal pain: Secondary | ICD-10-CM | POA: Diagnosis not present

## 2020-05-22 DIAGNOSIS — N433 Hydrocele, unspecified: Secondary | ICD-10-CM | POA: Diagnosis not present

## 2020-05-23 ENCOUNTER — Other Ambulatory Visit: Payer: Self-pay | Admitting: Internal Medicine

## 2020-05-23 ENCOUNTER — Ambulatory Visit
Admission: RE | Admit: 2020-05-23 | Discharge: 2020-05-23 | Disposition: A | Discharge status: Home / Self Care | Payer: Medicare Other | Source: Ambulatory Visit | Attending: Internal Medicine | Admitting: Internal Medicine

## 2020-05-23 DIAGNOSIS — N433 Hydrocele, unspecified: Secondary | ICD-10-CM | POA: Diagnosis not present

## 2020-05-23 DIAGNOSIS — I700 Atherosclerosis of aorta: Secondary | ICD-10-CM | POA: Diagnosis not present

## 2020-05-23 DIAGNOSIS — R109 Unspecified abdominal pain: Secondary | ICD-10-CM

## 2020-05-23 DIAGNOSIS — Z85038 Personal history of other malignant neoplasm of large intestine: Secondary | ICD-10-CM | POA: Diagnosis not present

## 2020-05-23 DIAGNOSIS — N400 Enlarged prostate without lower urinary tract symptoms: Secondary | ICD-10-CM | POA: Diagnosis not present

## 2020-05-23 MED ORDER — IOPAMIDOL (ISOVUE-300) INJECTION 61%
100.0000 mL | Freq: Once | INTRAVENOUS | Status: AC | PRN
  Administered 2020-05-23: 100 mL via INTRAVENOUS

## 2020-06-06 DIAGNOSIS — H2511 Age-related nuclear cataract, right eye: Secondary | ICD-10-CM | POA: Diagnosis not present

## 2020-06-06 DIAGNOSIS — H40053 Ocular hypertension, bilateral: Secondary | ICD-10-CM | POA: Diagnosis not present

## 2020-06-06 DIAGNOSIS — H35372 Puckering of macula, left eye: Secondary | ICD-10-CM | POA: Diagnosis not present

## 2020-06-06 DIAGNOSIS — H524 Presbyopia: Secondary | ICD-10-CM | POA: Diagnosis not present

## 2020-06-20 DIAGNOSIS — Z23 Encounter for immunization: Secondary | ICD-10-CM | POA: Diagnosis not present

## 2020-06-24 DIAGNOSIS — L821 Other seborrheic keratosis: Secondary | ICD-10-CM | POA: Diagnosis not present

## 2020-06-24 DIAGNOSIS — X32XXXD Exposure to sunlight, subsequent encounter: Secondary | ICD-10-CM | POA: Diagnosis not present

## 2020-06-24 DIAGNOSIS — Z08 Encounter for follow-up examination after completed treatment for malignant neoplasm: Secondary | ICD-10-CM | POA: Diagnosis not present

## 2020-06-24 DIAGNOSIS — C44319 Basal cell carcinoma of skin of other parts of face: Secondary | ICD-10-CM | POA: Diagnosis not present

## 2020-06-24 DIAGNOSIS — L570 Actinic keratosis: Secondary | ICD-10-CM | POA: Diagnosis not present

## 2020-06-24 DIAGNOSIS — Z1283 Encounter for screening for malignant neoplasm of skin: Secondary | ICD-10-CM | POA: Diagnosis not present

## 2020-06-24 DIAGNOSIS — Z85820 Personal history of malignant melanoma of skin: Secondary | ICD-10-CM | POA: Diagnosis not present

## 2020-06-27 DIAGNOSIS — R972 Elevated prostate specific antigen [PSA]: Secondary | ICD-10-CM | POA: Diagnosis not present

## 2020-07-23 DIAGNOSIS — R972 Elevated prostate specific antigen [PSA]: Secondary | ICD-10-CM | POA: Diagnosis not present

## 2020-07-23 DIAGNOSIS — N430 Encysted hydrocele: Secondary | ICD-10-CM | POA: Diagnosis not present

## 2020-07-23 DIAGNOSIS — N400 Enlarged prostate without lower urinary tract symptoms: Secondary | ICD-10-CM | POA: Diagnosis not present

## 2020-07-30 DIAGNOSIS — E039 Hypothyroidism, unspecified: Secondary | ICD-10-CM | POA: Diagnosis not present

## 2020-07-30 DIAGNOSIS — E1169 Type 2 diabetes mellitus with other specified complication: Secondary | ICD-10-CM | POA: Diagnosis not present

## 2020-07-30 DIAGNOSIS — E559 Vitamin D deficiency, unspecified: Secondary | ICD-10-CM | POA: Diagnosis not present

## 2020-07-30 DIAGNOSIS — E785 Hyperlipidemia, unspecified: Secondary | ICD-10-CM | POA: Diagnosis not present

## 2020-07-30 DIAGNOSIS — Z125 Encounter for screening for malignant neoplasm of prostate: Secondary | ICD-10-CM | POA: Diagnosis not present

## 2020-08-05 DIAGNOSIS — X32XXXD Exposure to sunlight, subsequent encounter: Secondary | ICD-10-CM | POA: Diagnosis not present

## 2020-08-05 DIAGNOSIS — L570 Actinic keratosis: Secondary | ICD-10-CM | POA: Diagnosis not present

## 2020-08-05 DIAGNOSIS — C44319 Basal cell carcinoma of skin of other parts of face: Secondary | ICD-10-CM | POA: Diagnosis not present

## 2020-08-06 DIAGNOSIS — Z0000 Encounter for general adult medical examination without abnormal findings: Secondary | ICD-10-CM | POA: Diagnosis not present

## 2020-08-06 DIAGNOSIS — E1169 Type 2 diabetes mellitus with other specified complication: Secondary | ICD-10-CM | POA: Diagnosis not present

## 2020-08-06 DIAGNOSIS — R972 Elevated prostate specific antigen [PSA]: Secondary | ICD-10-CM | POA: Diagnosis not present

## 2020-08-06 DIAGNOSIS — E785 Hyperlipidemia, unspecified: Secondary | ICD-10-CM | POA: Diagnosis not present

## 2020-08-06 DIAGNOSIS — E559 Vitamin D deficiency, unspecified: Secondary | ICD-10-CM | POA: Diagnosis not present

## 2020-08-06 DIAGNOSIS — I10 Essential (primary) hypertension: Secondary | ICD-10-CM | POA: Diagnosis not present

## 2020-08-06 DIAGNOSIS — N433 Hydrocele, unspecified: Secondary | ICD-10-CM | POA: Diagnosis not present

## 2020-08-06 DIAGNOSIS — R82998 Other abnormal findings in urine: Secondary | ICD-10-CM | POA: Diagnosis not present

## 2020-08-06 DIAGNOSIS — E039 Hypothyroidism, unspecified: Secondary | ICD-10-CM | POA: Diagnosis not present

## 2020-08-06 DIAGNOSIS — Z1212 Encounter for screening for malignant neoplasm of rectum: Secondary | ICD-10-CM | POA: Diagnosis not present

## 2020-09-09 DIAGNOSIS — H25011 Cortical age-related cataract, right eye: Secondary | ICD-10-CM | POA: Diagnosis not present

## 2020-09-09 DIAGNOSIS — H2511 Age-related nuclear cataract, right eye: Secondary | ICD-10-CM | POA: Diagnosis not present

## 2020-09-09 DIAGNOSIS — H25811 Combined forms of age-related cataract, right eye: Secondary | ICD-10-CM | POA: Diagnosis not present

## 2020-09-23 DIAGNOSIS — Z85828 Personal history of other malignant neoplasm of skin: Secondary | ICD-10-CM | POA: Diagnosis not present

## 2020-09-23 DIAGNOSIS — L814 Other melanin hyperpigmentation: Secondary | ICD-10-CM | POA: Diagnosis not present

## 2020-09-23 DIAGNOSIS — D0462 Carcinoma in situ of skin of left upper limb, including shoulder: Secondary | ICD-10-CM | POA: Diagnosis not present

## 2020-09-23 DIAGNOSIS — Z08 Encounter for follow-up examination after completed treatment for malignant neoplasm: Secondary | ICD-10-CM | POA: Diagnosis not present

## 2020-09-23 DIAGNOSIS — X32XXXD Exposure to sunlight, subsequent encounter: Secondary | ICD-10-CM | POA: Diagnosis not present

## 2020-09-23 DIAGNOSIS — L821 Other seborrheic keratosis: Secondary | ICD-10-CM | POA: Diagnosis not present

## 2020-09-23 DIAGNOSIS — L570 Actinic keratosis: Secondary | ICD-10-CM | POA: Diagnosis not present

## 2020-11-26 DIAGNOSIS — H26492 Other secondary cataract, left eye: Secondary | ICD-10-CM | POA: Diagnosis not present

## 2020-12-04 DIAGNOSIS — H26492 Other secondary cataract, left eye: Secondary | ICD-10-CM | POA: Diagnosis not present

## 2020-12-23 DIAGNOSIS — Z08 Encounter for follow-up examination after completed treatment for malignant neoplasm: Secondary | ICD-10-CM | POA: Diagnosis not present

## 2020-12-23 DIAGNOSIS — L820 Inflamed seborrheic keratosis: Secondary | ICD-10-CM | POA: Diagnosis not present

## 2020-12-23 DIAGNOSIS — Z1283 Encounter for screening for malignant neoplasm of skin: Secondary | ICD-10-CM | POA: Diagnosis not present

## 2020-12-23 DIAGNOSIS — X32XXXD Exposure to sunlight, subsequent encounter: Secondary | ICD-10-CM | POA: Diagnosis not present

## 2020-12-23 DIAGNOSIS — Z85820 Personal history of malignant melanoma of skin: Secondary | ICD-10-CM | POA: Diagnosis not present

## 2020-12-23 DIAGNOSIS — L821 Other seborrheic keratosis: Secondary | ICD-10-CM | POA: Diagnosis not present

## 2020-12-23 DIAGNOSIS — L570 Actinic keratosis: Secondary | ICD-10-CM | POA: Diagnosis not present

## 2020-12-31 ENCOUNTER — Ambulatory Visit: Payer: Medicare Other | Attending: Internal Medicine

## 2020-12-31 DIAGNOSIS — Z23 Encounter for immunization: Secondary | ICD-10-CM

## 2020-12-31 NOTE — Progress Notes (Signed)
   Covid-19 Vaccination Clinic  Name:  Omar Haley    MRN: [id number] DOB: 1930/10/31  12/31/2020  [name] was observed post Covid-19 immunization for 15 minutes without incident. He was provided with Vaccine Information Sheet and instruction to access the V-Safe system.   [name] was instructed to call 911 with any severe reactions post vaccine: [name] Kitchen Difficulty breathing  . Swelling of face and throat  . A fast heartbeat  . A bad rash all over body  . Dizziness and weakness   Immunizations Administered    Name Date Dose VIS Date Route   PFIZER Comrnaty(Gray TOP) Covid-19 Vaccine 12/31/2020  9:47 AM 0.3 mL 08/21/2020 Intramuscular   Manufacturer: [employer], [employer]   Lot: [id number]   [place]: [phone]

## 2021-01-05 ENCOUNTER — Other Ambulatory Visit (HOSPITAL_COMMUNITY): Payer: Self-pay

## 2021-01-05 MED ORDER — COVID-19 MRNA VAC-TRIS(PFIZER) 30 MCG/0.3ML IM SUSP
INTRAMUSCULAR | 0 refills | Status: DC
  Filled 2021-01-05: qty 0.3, 17d supply, fill #0

## 2021-01-06 ENCOUNTER — Other Ambulatory Visit (HOSPITAL_COMMUNITY): Payer: Self-pay

## 2021-01-08 DIAGNOSIS — R972 Elevated prostate specific antigen [PSA]: Secondary | ICD-10-CM | POA: Diagnosis not present

## 2021-01-15 DIAGNOSIS — R972 Elevated prostate specific antigen [PSA]: Secondary | ICD-10-CM | POA: Diagnosis not present

## 2021-01-15 DIAGNOSIS — N400 Enlarged prostate without lower urinary tract symptoms: Secondary | ICD-10-CM | POA: Diagnosis not present

## 2021-02-05 DIAGNOSIS — M25511 Pain in right shoulder: Secondary | ICD-10-CM | POA: Diagnosis not present

## 2021-02-05 DIAGNOSIS — I10 Essential (primary) hypertension: Secondary | ICD-10-CM | POA: Diagnosis not present

## 2021-02-05 DIAGNOSIS — E1169 Type 2 diabetes mellitus with other specified complication: Secondary | ICD-10-CM | POA: Diagnosis not present

## 2021-02-20 DIAGNOSIS — X32XXXD Exposure to sunlight, subsequent encounter: Secondary | ICD-10-CM | POA: Diagnosis not present

## 2021-02-20 DIAGNOSIS — L570 Actinic keratosis: Secondary | ICD-10-CM | POA: Diagnosis not present

## 2021-02-20 DIAGNOSIS — L820 Inflamed seborrheic keratosis: Secondary | ICD-10-CM | POA: Diagnosis not present

## 2021-02-20 DIAGNOSIS — C44311 Basal cell carcinoma of skin of nose: Secondary | ICD-10-CM | POA: Diagnosis not present

## 2021-03-23 DIAGNOSIS — C61 Malignant neoplasm of prostate: Secondary | ICD-10-CM | POA: Diagnosis not present

## 2021-03-23 DIAGNOSIS — R972 Elevated prostate specific antigen [PSA]: Secondary | ICD-10-CM | POA: Diagnosis not present

## 2021-04-02 ENCOUNTER — Other Ambulatory Visit: Payer: Self-pay

## 2021-04-02 ENCOUNTER — Telehealth: Payer: Self-pay | Admitting: Radiation Oncology

## 2021-04-02 DIAGNOSIS — C61 Malignant neoplasm of prostate: Secondary | ICD-10-CM | POA: Diagnosis not present

## 2021-04-02 NOTE — Telephone Encounter (Signed)
Attempted to call patient to schedule consultation with Dr. Tammi Klippel but received a busy signal.

## 2021-04-10 NOTE — Progress Notes (Signed)
Radiation Oncology         [phone]) [phone] ________________________________  Initial Outpatient Consultation  Name: Omar Haley MRN: [id number]  Date: 04/13/2021  DOB: Mar 25, 1931  CC:[name], MD  Festus Aloe, MD   REFERRING PHYSICIAN: Festus Aloe, MD  DIAGNOSIS: 87 y.o. gentleman with Stage T1c adenocarcinoma of the prostate with Gleason score of 4+5, and PSA of 25.9.    ICD-10-CM   1. Malignant neoplasm of prostate [facility])  [place] Ambulatory referral to Social Work      HISTORY OF PRESENT ILLNESS: Omar Haley is a 87 y.o. male with a longstanding history of elevated PSA since at least 2017, ranging between 7.5-20 and fluctuating over the years.  He has been followed closely by Dr. Rosana Hoes and more recently with Dr. Junious Silk and has had a benign digital rectal examination.  He had a CT A/P in September 2021 which was negative for any acute findings or metastatic disease in the abdomen or pelvis.  His PSA was noted further increased to 25.9 in April 2022 so he proceeded to transrectal ultrasound with 12 biopsies of the prostate on 03/23/2021.  The prostate volume measured 50.55 cc.  Out of 12 core biopsies, 5 were positive.  The maximum Gleason score was 4+5, and this was seen in the right apex lateral.  Additionally, Gleason 3+5 disease was seen in the right apex, Gleason 3+4 in the left apex and right mid as well as Gleason 3+3 in the right base lateral.  It appears that a PSMA scan has been ordered to complete his disease staging but has not yet been scheduled.  The patient reviewed the biopsy results with his urologist and he has kindly been referred today for discussion of potential radiation treatment options.   PREVIOUS RADIATION THERAPY: No  PAST MEDICAL HISTORY:  Past Medical History:  Diagnosis Date   B12 deficiency    Cancer ([place])    melanoma   Diabetes mellitus without complication (HCC)    DJD (degenerative joint disease)    Dupuytren's contracture     Glaucoma    Hemorrhoids    History of shingles    Hyperlipidemia    Hypertension    Neuropathy    Prostatitis    Unifocal PVCs    Vitamin D deficiency       PAST SURGICAL HISTORY: Past Surgical History:  Procedure Laterality Date   COLON SURGERY  1991   rt hemicolectomy   HERNIA REPAIR     MELANOMA EXCISION  11/2005&04/2011   skin    FAMILY HISTORY:  Family History  Problem Relation Age of Onset   Heart failure Mother    Cerebral aneurysm Father     SOCIAL HISTORY:  Social History   Socioeconomic History   Marital status: Married    Spouse name: Not on file   Number of children: Not on file   Years of education: Not on file   Highest education level: Not on file  Occupational History   Not on file  Tobacco Use   Smoking status: Never   Smokeless tobacco: Not on file  Substance and Sexual Activity   Alcohol use: No   Drug use: No   Sexual activity: Not on file  Other Topics Concern   Not on file  Social History Narrative   Not on file   Social Determinants of Health   Financial Resource Strain: Not on file  Food Insecurity: Not on file  Transportation Needs: Not on file  Physical Activity: Not on  file  Stress: Not on file  Social Connections: Not on file  Intimate Partner Violence: Not on file    ALLERGIES: Enalapril, Gemfibrozil, Micardis [telmisartan], and Zetia [ezetimibe]  MEDICATIONS:  Current Outpatient Medications  Medication Sig Dispense Refill   amLODipine (NORVASC) 2.5 MG tablet TK 1 T PO D     bimatoprost (LUMIGAN) 0.01 % SOLN PLACE 1 DROP INTO BOTH EYES AT BEDTIME     Cyanocobalamin (VITAMIN B 12 PO) Take 1,000 mcg by mouth daily.     ibuprofen (ADVIL,MOTRIN) 200 MG tablet Take 200 mg by mouth every 4 (four) hours as needed for pain.     levothyroxine (SYNTHROID) 50 MCG tablet TK 1 T PO D     losartan-hydrochlorothiazide (HYZAAR) 100-25 MG per tablet Take 1 tablet by mouth daily.     Travoprost, BAK Free, (TRAVATAN) 0.004 % SOLN  ophthalmic solution 1 drop at bedtime.     Aspirin 81 MG EC tablet Take 81 mg by mouth daily. (Patient not taking: Reported on 04/13/2021)     COVID-19 mRNA Vac-TriS, Pfizer, SUSP injection Inject into the muscle. (Patient not taking: Reported on 04/13/2021) 0.3 mL 0   ergocalciferol (VITAMIN D2) 50000 UNITS capsule Take 50,000 Units by mouth once a week. (Patient not taking: Reported on 04/13/2021)     hydrocortisone (ANUSOL-HC) 25 MG suppository Place 1 suppository (25 mg total) rectally at bedtime. (Patient not taking: Reported on 04/13/2021) 12 suppository 0   nitroGLYCERIN (NITROSTAT) 0.4 MG SL tablet Place 0.4 mg under the tongue every 5 (five) minutes as needed for chest pain. (Patient not taking: Reported on 04/13/2021)     simvastatin (ZOCOR) 20 MG tablet Take 20 mg by mouth every evening. (Patient not taking: Reported on 04/13/2021)     No current facility-administered medications for this encounter.    REVIEW OF SYSTEMS:  On review of systems, the patient reports that he is doing well overall. He denies any chest pain, shortness of breath, cough, fevers, chills, night sweats, unintended weight changes. He denies any bowel disturbances, and denies abdominal pain, nausea or vomiting. He denies any new musculoskeletal or joint aches or pains.  His IPSS was Total Score: 1, indicating mild urinary symptoms. His SHIM: 15, indicating he does have moderate erectile dysfunction. A complete review of systems is obtained and is otherwise negative.  The patient and his wife still live alone in a house with a large yard and gardens.  They maintain their home and yard.  Fortunately, they have two adult bachelor sons who live nearby and are available to provide support.   PHYSICAL EXAM:  Wt Readings from Last 3 Encounters:  04/13/21 163 lb 9.6 oz (74.2 kg)  07/26/13 164 lb 9.6 oz (74.7 kg)  06/28/13 162 lb 9.6 oz (73.8 kg)   Temp Readings from Last 3 Encounters:  04/13/21 97.9 F (36.6 C)  07/26/13 98.4 F  (36.9 C) (Temporal)  05/21/13 97 F (36.1 C) (Temporal)   BP Readings from Last 3 Encounters:  04/13/21 (!) 179/70  07/26/13 140/90  06/28/13 (!) 152/74   Pulse Readings from Last 3 Encounters:  04/13/21 66  07/26/13 76  06/28/13 76   Pain Assessment Pain Score: 4  Pain Loc: Shoulder/10  In general this is a well appearing man in no acute distress. He's alert and oriented x4 and appropriate throughout the examination. Cardiopulmonary assessment is negative for acute distress, and he exhibits Omar effort.     KPS = 100  100 - Omar; no  complaints; no evidence of disease. 90   - Able to carry on Omar activity; minor signs or symptoms of disease. 80   - Omar activity with effort; some signs or symptoms of disease. 32   - Cares for self; unable to carry on Omar activity or to do active work. 60   - Requires occasional assistance, but is able to care for most of his personal needs. 50   - Requires considerable assistance and frequent medical care. 12   - Disabled; requires special care and assistance. 65   - Severely disabled; hospital admission is indicated although death not imminent. 56   - Very sick; hospital admission necessary; active supportive treatment necessary. 10   - Moribund; fatal processes progressing rapidly. 0     - Dead  Karnofsky DA, Abelmann [place], Craver LS and Burchenal [facility] [phone]) The use of the nitrogen mustards in the palliative treatment of carcinoma: with particular reference to bronchogenic carcinoma Cancer 1 634-56  LABORATORY DATA:  Lab Results  Component Value Date   WBC 8.2 04/28/2011   HGB 14.2 04/28/2011   HCT 41.8 04/28/2011   MCV 85.5 04/28/2011   PLT 146 (L) 04/28/2011   Lab Results  Component Value Date   NA 137 04/28/2011   K 3.7 04/28/2011   CL 99 04/28/2011   CO2 30 04/28/2011   Lab Results  Component Value Date   ALT 24 04/28/2011   AST 19 04/28/2011   ALKPHOS 57 04/28/2011   BILITOT 0.4 04/28/2011     RADIOGRAPHY:  No results found.    IMPRESSION/PLAN: 1. 87 y.o. gentleman with Stage T1c adenocarcinoma of the prostate with Gleason Score of 4+5, and PSA of 25.9. We discussed the patient's workup and outlined the nature of prostate cancer in this setting. The patient's T stage, Gleason's score, and PSA put him into the high risk group. Accordingly, he is eligible for a variety of potential treatment options including prostatectomy or LT-ADT in combination with either 8 weeks of external radiation or 5 weeks of external radiation with an upfront brachytherapy boost. We discussed the available radiation techniques, and focused on the details and logistics of delivery.  The patient is not an ideal candidate for prostatectomy given his advanced age.  We discussed and outlined the risks, benefits, short and long-term effects associated with radiotherapy and compared and contrasted these with prostatectomy. We discussed the role of SpaceOAR gel in reducing the rectal toxicity associated with radiotherapy. We also detailed the role of ADT in the treatment of high risk prostate cancer and outlined the associated side effects that could be expected with this therapy.  He appears to have a good understanding of his disease and our treatment recommendations which are of curative intent.  He was encouraged to ask questions that were answered to his stated satisfaction.  At the conclusion of our conversation, the patient is interested in moving forward with PSMA-PET and initiation of ADT followed by 8 weeks of IMRT in approximately 2 months, pending PET findings.  I personally spent 60 minutes in this encounter including chart review, reviewing radiological studies, meeting face-to-face with the patient, entering orders and completing documentation.   ------------------------------------------------   Tyler Pita, MD [place]: [phone]  Fax: [phone] [email or web address]  Skype   LinkedIn

## 2021-04-10 NOTE — Progress Notes (Signed)
GU Location of Tumor / Histology:  Adenocarcinoma of the prostate  If Prostate Cancer, Gleason Score is (4 + 5), PSA (25.9 as of 01/08/21), and Prostate volume (51 g)  Elfredia Nevins presented with signs/symptoms of: rising PSA levels since 2017  Biopsies revealed:  03/23/2021   Past/Anticipated interventions by urology, if any:  04/02/2021 Dr. Festus Aloe   03/23/2021 Dr. Festus Aloe Transrectal ultrasound of prostate with biopsies  Past/Anticipated interventions by medical oncology, if any:  No referral placed at this time  Weight changes, if any: no  IPSS Score: 1 SHIM Score: 15  Bowel/Bladder complaints, if any: no   Nausea/Vomiting, if any: no  Pain issues, if any:  right shoulder/arm/hand.   SAFETY ISSUES: Prior radiation? no Pacemaker/ICD? no Possible current pregnancy? N/A Is the patient on methotrexate? no  Current Complaints / other details:

## 2021-04-13 ENCOUNTER — Ambulatory Visit
Admission: RE | Admit: 2021-04-13 | Discharge: 2021-04-13 | Disposition: A | Discharge status: Home / Self Care | Payer: Medicare Other | Source: Ambulatory Visit | Attending: Radiation Oncology | Admitting: Radiation Oncology

## 2021-04-13 ENCOUNTER — Other Ambulatory Visit: Payer: Self-pay

## 2021-04-13 ENCOUNTER — Encounter: Payer: Self-pay | Admitting: Radiation Oncology

## 2021-04-13 VITALS — BP 179/70 | HR 66 | Temp 97.9°F | Resp 20 | Ht 69.0 in | Wt 163.6 lb

## 2021-04-13 DIAGNOSIS — E559 Vitamin D deficiency, unspecified: Secondary | ICD-10-CM | POA: Insufficient documentation

## 2021-04-13 DIAGNOSIS — Z79899 Other long term (current) drug therapy: Secondary | ICD-10-CM | POA: Diagnosis not present

## 2021-04-13 DIAGNOSIS — I10 Essential (primary) hypertension: Secondary | ICD-10-CM | POA: Insufficient documentation

## 2021-04-13 DIAGNOSIS — Z85820 Personal history of malignant melanoma of skin: Secondary | ICD-10-CM | POA: Diagnosis not present

## 2021-04-13 DIAGNOSIS — Z7982 Long term (current) use of aspirin: Secondary | ICD-10-CM | POA: Insufficient documentation

## 2021-04-13 DIAGNOSIS — E119 Type 2 diabetes mellitus without complications: Secondary | ICD-10-CM | POA: Diagnosis not present

## 2021-04-13 DIAGNOSIS — Z791 Long term (current) use of non-steroidal anti-inflammatories (NSAID): Secondary | ICD-10-CM | POA: Diagnosis not present

## 2021-04-13 DIAGNOSIS — C61 Malignant neoplasm of prostate: Secondary | ICD-10-CM | POA: Diagnosis not present

## 2021-04-13 DIAGNOSIS — M1990 Unspecified osteoarthritis, unspecified site: Secondary | ICD-10-CM | POA: Insufficient documentation

## 2021-04-13 DIAGNOSIS — G629 Polyneuropathy, unspecified: Secondary | ICD-10-CM | POA: Insufficient documentation

## 2021-04-13 DIAGNOSIS — E785 Hyperlipidemia, unspecified: Secondary | ICD-10-CM | POA: Diagnosis not present

## 2021-04-14 ENCOUNTER — Encounter: Payer: Self-pay | Admitting: Licensed Clinical Social Worker

## 2021-04-14 NOTE — Progress Notes (Signed)
[place] Psychosocial Distress Screening Clinical Social Work  Clinical Social Work was referred by distress screening protocol.  The patient scored a 8 on the Psychosocial Distress Thermometer which indicates severe distress. Clinical Social Worker contacted patient by phone to assess for distress and other psychosocial needs.   Patient reports doing well after receiving more information and a plan at his appointment yesterday. He has good support at home from his wife and has two adult sons who live within a mile of him who are available to help.  No practical needs at this time.  CSW informed patient of the support team and support services at [facility].  CSW provided contact information and encouraged patient to call with any questions or concerns.  ONCBCN DISTRESS SCREENING 04/13/2021  Screening Type Initial Screening  Distress experienced in past week (1-10) 8  Physical Problem type Breathing    Clinical Social Worker follow up needed: No.  If yes, follow up plan:  Omar Kantor E Mikias Lanz, Omar Haley

## 2021-04-15 ENCOUNTER — Other Ambulatory Visit (HOSPITAL_COMMUNITY): Payer: Self-pay | Admitting: Urology

## 2021-04-15 DIAGNOSIS — C61 Malignant neoplasm of prostate: Secondary | ICD-10-CM

## 2021-04-22 DIAGNOSIS — X32XXXD Exposure to sunlight, subsequent encounter: Secondary | ICD-10-CM | POA: Diagnosis not present

## 2021-04-22 DIAGNOSIS — Z08 Encounter for follow-up examination after completed treatment for malignant neoplasm: Secondary | ICD-10-CM | POA: Diagnosis not present

## 2021-04-22 DIAGNOSIS — Z85828 Personal history of other malignant neoplasm of skin: Secondary | ICD-10-CM | POA: Diagnosis not present

## 2021-04-22 DIAGNOSIS — B078 Other viral warts: Secondary | ICD-10-CM | POA: Diagnosis not present

## 2021-04-22 DIAGNOSIS — L570 Actinic keratosis: Secondary | ICD-10-CM | POA: Diagnosis not present

## 2021-05-01 ENCOUNTER — Other Ambulatory Visit: Payer: Self-pay

## 2021-05-01 ENCOUNTER — Ambulatory Visit (HOSPITAL_COMMUNITY)
Admission: RE | Admit: 2021-05-01 | Discharge: 2021-05-01 | Disposition: A | Discharge status: Home / Self Care | Payer: Medicare Other | Source: Ambulatory Visit | Attending: Urology | Admitting: Urology

## 2021-05-01 DIAGNOSIS — K449 Diaphragmatic hernia without obstruction or gangrene: Secondary | ICD-10-CM | POA: Diagnosis not present

## 2021-05-01 DIAGNOSIS — C61 Malignant neoplasm of prostate: Secondary | ICD-10-CM | POA: Diagnosis not present

## 2021-05-01 DIAGNOSIS — I2510 Atherosclerotic heart disease of native coronary artery without angina pectoris: Secondary | ICD-10-CM | POA: Diagnosis not present

## 2021-05-01 DIAGNOSIS — K225 Diverticulum of esophagus, acquired: Secondary | ICD-10-CM | POA: Diagnosis not present

## 2021-05-01 MED ORDER — PIFLIFOLASTAT F 18 (PYLARIFY) INJECTION
9.0000 | Freq: Once | INTRAVENOUS | Status: AC
  Administered 2021-05-01: 9.2 via INTRAVENOUS

## 2021-05-04 ENCOUNTER — Telehealth: Payer: Self-pay | Admitting: *Deleted

## 2021-05-04 NOTE — Telephone Encounter (Signed)
CALLED PATIENT TO INFORM OF ADT APPT. @ DR. Lyndal Rainbow OFFICE ON 05-06-21 @ 10:30 AM, SPOKE WITH PATIENT AND HE IS AWARE OF THIS APPT.

## 2021-05-06 DIAGNOSIS — C61 Malignant neoplasm of prostate: Secondary | ICD-10-CM | POA: Diagnosis not present

## 2021-05-06 DIAGNOSIS — Z5111 Encounter for antineoplastic chemotherapy: Secondary | ICD-10-CM | POA: Diagnosis not present

## 2021-05-21 DIAGNOSIS — H26491 Other secondary cataract, right eye: Secondary | ICD-10-CM | POA: Diagnosis not present

## 2021-05-21 DIAGNOSIS — Z961 Presence of intraocular lens: Secondary | ICD-10-CM | POA: Diagnosis not present

## 2021-05-21 DIAGNOSIS — H40053 Ocular hypertension, bilateral: Secondary | ICD-10-CM | POA: Diagnosis not present

## 2021-05-21 DIAGNOSIS — H524 Presbyopia: Secondary | ICD-10-CM | POA: Diagnosis not present

## 2021-05-27 ENCOUNTER — Telehealth: Payer: Self-pay | Admitting: *Deleted

## 2021-05-27 NOTE — Telephone Encounter (Signed)
CALLED PATIENT TO INFORM OF FID. MARKER AND SPACE OAR PLACEMENT ON 06-25-21 @ [facility] AND HIS SIM APPT. ON 06-30-21 - ARRIVAL TIME- 10:15 AM @ [facility], LVM FOR A RETURN CALL

## 2021-06-25 ENCOUNTER — Ambulatory Visit: Payer: Medicare Other | Admitting: Radiation Oncology

## 2021-06-25 DIAGNOSIS — C61 Malignant neoplasm of prostate: Secondary | ICD-10-CM | POA: Diagnosis not present

## 2021-06-26 ENCOUNTER — Telehealth: Payer: Self-pay | Admitting: *Deleted

## 2021-06-26 NOTE — Telephone Encounter (Signed)
CALLED PATIENT TO REMIND OF SIM APPT. FOR 06-30-21- ARRIVAL TIME - 10:15 AM @ [facility], SPOKE WITH PATIENT AND HE IS AWARE OF THIS APPT.

## 2021-06-29 NOTE — Progress Notes (Signed)
  Radiation Oncology         [phone] ________________________________  Name: Omar Haley MRN: [id number]  Date: 06/30/2021  DOB: July 31, 1931  SIMULATION AND TREATMENT PLANNING NOTE    ICD-10-CM   1. Malignant neoplasm of prostate ([place])  C61       DIAGNOSIS:  87 y.o. gentleman with Stage T1c adenocarcinoma of the prostate with Gleason score of 4+5, and PSA of 25.9.  NARRATIVE:  The patient was brought to the [facility].  Identity was confirmed.  All relevant records and images related to the planned course of therapy were reviewed.  The patient freely provided informed written consent to proceed with treatment after reviewing the details related to the planned course of therapy. The consent form was witnessed and verified by the simulation staff.  Then, the patient was set-up in a stable reproducible supine position for radiation therapy.  A vacuum lock pillow device was custom fabricated to position his legs in a reproducible immobilized position.  Then, I performed a urethrogram under sterile conditions to identify the prostatic bed.  CT images were obtained.  Surface markings were placed.  The CT images were loaded into the planning software.  Then the prostate bed target, pelvic lymph node target and avoidance structures including the rectum, bladder, bowel and hips were contoured.  Treatment planning then occurred.  The radiation prescription was entered and confirmed.  A total of one complex treatment devices were fabricated. I have requested : Intensity Modulated Radiotherapy (IMRT) is medically necessary for this case for the following reason:  Rectal sparing.[name] Kitchen  PLAN:  The patient will receive 45 Gy in 25 fractions of 1.8 Gy, followed by a boost to the prostate to a total dose of 75 Gy with 15 additional fractions of 2 Gy.   ________________________________  Sheral Apley Tammi Klippel, M.D.

## 2021-06-30 ENCOUNTER — Encounter: Payer: Self-pay | Admitting: Radiation Oncology

## 2021-06-30 ENCOUNTER — Other Ambulatory Visit: Payer: Self-pay

## 2021-06-30 ENCOUNTER — Ambulatory Visit
Admission: RE | Admit: 2021-06-30 | Discharge: 2021-06-30 | Disposition: A | Discharge status: Home / Self Care | Payer: Medicare Other | Source: Ambulatory Visit | Attending: Radiation Oncology | Admitting: Radiation Oncology

## 2021-06-30 DIAGNOSIS — Z510 Encounter for antineoplastic radiation therapy: Secondary | ICD-10-CM | POA: Diagnosis not present

## 2021-06-30 DIAGNOSIS — C61 Malignant neoplasm of prostate: Secondary | ICD-10-CM | POA: Diagnosis not present

## 2021-07-07 DIAGNOSIS — C61 Malignant neoplasm of prostate: Secondary | ICD-10-CM | POA: Diagnosis not present

## 2021-07-07 DIAGNOSIS — Z510 Encounter for antineoplastic radiation therapy: Secondary | ICD-10-CM | POA: Diagnosis not present

## 2021-07-09 ENCOUNTER — Ambulatory Visit
Admission: RE | Admit: 2021-07-09 | Discharge: 2021-07-09 | Disposition: A | Discharge status: Home / Self Care | Payer: Medicare Other | Source: Ambulatory Visit | Attending: Radiation Oncology | Admitting: Radiation Oncology

## 2021-07-09 ENCOUNTER — Other Ambulatory Visit: Payer: Self-pay

## 2021-07-09 DIAGNOSIS — C61 Malignant neoplasm of prostate: Secondary | ICD-10-CM | POA: Diagnosis not present

## 2021-07-09 DIAGNOSIS — Z510 Encounter for antineoplastic radiation therapy: Secondary | ICD-10-CM | POA: Diagnosis not present

## 2021-07-10 ENCOUNTER — Ambulatory Visit
Admission: RE | Admit: 2021-07-10 | Discharge: 2021-07-10 | Disposition: A | Discharge status: Home / Self Care | Payer: Medicare Other | Source: Ambulatory Visit | Attending: Radiation Oncology | Admitting: Radiation Oncology

## 2021-07-10 DIAGNOSIS — C61 Malignant neoplasm of prostate: Secondary | ICD-10-CM

## 2021-07-10 DIAGNOSIS — Z510 Encounter for antineoplastic radiation therapy: Secondary | ICD-10-CM | POA: Diagnosis not present

## 2021-07-12 DIAGNOSIS — Z23 Encounter for immunization: Secondary | ICD-10-CM | POA: Diagnosis not present

## 2021-07-13 ENCOUNTER — Ambulatory Visit
Admission: RE | Admit: 2021-07-13 | Discharge: 2021-07-13 | Disposition: A | Discharge status: Home / Self Care | Payer: Medicare Other | Source: Ambulatory Visit | Attending: Radiation Oncology | Admitting: Radiation Oncology

## 2021-07-13 ENCOUNTER — Other Ambulatory Visit: Payer: Self-pay

## 2021-07-13 DIAGNOSIS — Z510 Encounter for antineoplastic radiation therapy: Secondary | ICD-10-CM | POA: Diagnosis not present

## 2021-07-13 DIAGNOSIS — C61 Malignant neoplasm of prostate: Secondary | ICD-10-CM | POA: Diagnosis not present

## 2021-07-14 ENCOUNTER — Ambulatory Visit
Admission: RE | Admit: 2021-07-14 | Discharge: 2021-07-14 | Disposition: A | Discharge status: Home / Self Care | Payer: Medicare Other | Source: Ambulatory Visit | Attending: Radiation Oncology | Admitting: Radiation Oncology

## 2021-07-14 DIAGNOSIS — C61 Malignant neoplasm of prostate: Secondary | ICD-10-CM | POA: Diagnosis not present

## 2021-07-14 DIAGNOSIS — Z510 Encounter for antineoplastic radiation therapy: Secondary | ICD-10-CM | POA: Insufficient documentation

## 2021-07-15 ENCOUNTER — Ambulatory Visit
Admission: RE | Admit: 2021-07-15 | Discharge: 2021-07-15 | Disposition: A | Discharge status: Home / Self Care | Payer: Medicare Other | Source: Ambulatory Visit | Attending: Radiation Oncology | Admitting: Radiation Oncology

## 2021-07-15 ENCOUNTER — Other Ambulatory Visit: Payer: Self-pay

## 2021-07-15 DIAGNOSIS — C61 Malignant neoplasm of prostate: Secondary | ICD-10-CM | POA: Diagnosis not present

## 2021-07-15 DIAGNOSIS — Z510 Encounter for antineoplastic radiation therapy: Secondary | ICD-10-CM | POA: Diagnosis not present

## 2021-07-16 ENCOUNTER — Ambulatory Visit
Admission: RE | Admit: 2021-07-16 | Discharge: 2021-07-16 | Disposition: A | Discharge status: Home / Self Care | Payer: Medicare Other | Source: Ambulatory Visit | Attending: Radiation Oncology | Admitting: Radiation Oncology

## 2021-07-16 DIAGNOSIS — Z510 Encounter for antineoplastic radiation therapy: Secondary | ICD-10-CM | POA: Diagnosis not present

## 2021-07-16 DIAGNOSIS — C61 Malignant neoplasm of prostate: Secondary | ICD-10-CM | POA: Diagnosis not present

## 2021-07-17 ENCOUNTER — Other Ambulatory Visit: Payer: Self-pay

## 2021-07-17 ENCOUNTER — Ambulatory Visit
Admission: RE | Admit: 2021-07-17 | Discharge: 2021-07-17 | Disposition: A | Discharge status: Home / Self Care | Payer: Medicare Other | Source: Ambulatory Visit | Attending: Radiation Oncology | Admitting: Radiation Oncology

## 2021-07-17 DIAGNOSIS — C61 Malignant neoplasm of prostate: Secondary | ICD-10-CM | POA: Diagnosis not present

## 2021-07-17 DIAGNOSIS — Z510 Encounter for antineoplastic radiation therapy: Secondary | ICD-10-CM | POA: Diagnosis not present

## 2021-07-20 ENCOUNTER — Ambulatory Visit
Admission: RE | Admit: 2021-07-20 | Discharge: 2021-07-20 | Disposition: A | Discharge status: Home / Self Care | Payer: Medicare Other | Source: Ambulatory Visit | Attending: Radiation Oncology | Admitting: Radiation Oncology

## 2021-07-20 ENCOUNTER — Other Ambulatory Visit: Payer: Self-pay

## 2021-07-20 DIAGNOSIS — Z510 Encounter for antineoplastic radiation therapy: Secondary | ICD-10-CM | POA: Diagnosis not present

## 2021-07-20 DIAGNOSIS — C61 Malignant neoplasm of prostate: Secondary | ICD-10-CM | POA: Diagnosis not present

## 2021-07-21 ENCOUNTER — Ambulatory Visit
Admission: RE | Admit: 2021-07-21 | Discharge: 2021-07-21 | Disposition: A | Discharge status: Home / Self Care | Payer: Medicare Other | Source: Ambulatory Visit | Attending: Radiation Oncology | Admitting: Radiation Oncology

## 2021-07-21 DIAGNOSIS — C61 Malignant neoplasm of prostate: Secondary | ICD-10-CM | POA: Diagnosis not present

## 2021-07-21 DIAGNOSIS — Z510 Encounter for antineoplastic radiation therapy: Secondary | ICD-10-CM | POA: Diagnosis not present

## 2021-07-22 ENCOUNTER — Ambulatory Visit
Admission: RE | Admit: 2021-07-22 | Discharge: 2021-07-22 | Disposition: A | Discharge status: Home / Self Care | Payer: Medicare Other | Source: Ambulatory Visit | Attending: Radiation Oncology | Admitting: Radiation Oncology

## 2021-07-22 ENCOUNTER — Other Ambulatory Visit: Payer: Self-pay

## 2021-07-22 DIAGNOSIS — Z510 Encounter for antineoplastic radiation therapy: Secondary | ICD-10-CM | POA: Diagnosis not present

## 2021-07-22 DIAGNOSIS — C61 Malignant neoplasm of prostate: Secondary | ICD-10-CM | POA: Diagnosis not present

## 2021-07-23 ENCOUNTER — Ambulatory Visit
Admission: RE | Admit: 2021-07-23 | Discharge: 2021-07-23 | Disposition: A | Discharge status: Home / Self Care | Payer: Medicare Other | Source: Ambulatory Visit | Attending: Radiation Oncology | Admitting: Radiation Oncology

## 2021-07-23 DIAGNOSIS — C61 Malignant neoplasm of prostate: Secondary | ICD-10-CM | POA: Diagnosis not present

## 2021-07-23 DIAGNOSIS — Z510 Encounter for antineoplastic radiation therapy: Secondary | ICD-10-CM | POA: Diagnosis not present

## 2021-07-24 ENCOUNTER — Other Ambulatory Visit: Payer: Self-pay

## 2021-07-24 ENCOUNTER — Ambulatory Visit
Admission: RE | Admit: 2021-07-24 | Discharge: 2021-07-24 | Disposition: A | Discharge status: Home / Self Care | Payer: Medicare Other | Source: Ambulatory Visit | Attending: Radiation Oncology | Admitting: Radiation Oncology

## 2021-07-24 DIAGNOSIS — Z510 Encounter for antineoplastic radiation therapy: Secondary | ICD-10-CM | POA: Diagnosis not present

## 2021-07-24 DIAGNOSIS — C61 Malignant neoplasm of prostate: Secondary | ICD-10-CM | POA: Diagnosis not present

## 2021-07-27 ENCOUNTER — Other Ambulatory Visit: Payer: Self-pay

## 2021-07-27 ENCOUNTER — Ambulatory Visit
Admission: RE | Admit: 2021-07-27 | Discharge: 2021-07-27 | Disposition: A | Discharge status: Home / Self Care | Payer: Medicare Other | Source: Ambulatory Visit | Attending: Radiation Oncology | Admitting: Radiation Oncology

## 2021-07-27 DIAGNOSIS — Z510 Encounter for antineoplastic radiation therapy: Secondary | ICD-10-CM | POA: Diagnosis not present

## 2021-07-27 DIAGNOSIS — C61 Malignant neoplasm of prostate: Secondary | ICD-10-CM | POA: Diagnosis not present

## 2021-07-28 ENCOUNTER — Ambulatory Visit
Admission: RE | Admit: 2021-07-28 | Discharge: 2021-07-28 | Disposition: A | Discharge status: Home / Self Care | Payer: Medicare Other | Source: Ambulatory Visit | Attending: Radiation Oncology | Admitting: Radiation Oncology

## 2021-07-28 DIAGNOSIS — C61 Malignant neoplasm of prostate: Secondary | ICD-10-CM | POA: Diagnosis not present

## 2021-07-28 DIAGNOSIS — Z510 Encounter for antineoplastic radiation therapy: Secondary | ICD-10-CM | POA: Diagnosis not present

## 2021-07-29 ENCOUNTER — Other Ambulatory Visit: Payer: Self-pay

## 2021-07-29 ENCOUNTER — Ambulatory Visit
Admission: RE | Admit: 2021-07-29 | Discharge: 2021-07-29 | Disposition: A | Discharge status: Home / Self Care | Payer: Medicare Other | Source: Ambulatory Visit | Attending: Radiation Oncology | Admitting: Radiation Oncology

## 2021-07-29 DIAGNOSIS — Z510 Encounter for antineoplastic radiation therapy: Secondary | ICD-10-CM | POA: Diagnosis not present

## 2021-07-29 DIAGNOSIS — C61 Malignant neoplasm of prostate: Secondary | ICD-10-CM | POA: Diagnosis not present

## 2021-07-30 ENCOUNTER — Ambulatory Visit
Admission: RE | Admit: 2021-07-30 | Discharge: 2021-07-30 | Disposition: A | Discharge status: Home / Self Care | Payer: Medicare Other | Source: Ambulatory Visit | Attending: Radiation Oncology | Admitting: Radiation Oncology

## 2021-07-30 DIAGNOSIS — Z510 Encounter for antineoplastic radiation therapy: Secondary | ICD-10-CM | POA: Diagnosis not present

## 2021-07-30 DIAGNOSIS — C61 Malignant neoplasm of prostate: Secondary | ICD-10-CM | POA: Diagnosis not present

## 2021-07-31 ENCOUNTER — Other Ambulatory Visit: Payer: Self-pay

## 2021-07-31 ENCOUNTER — Ambulatory Visit
Admission: RE | Admit: 2021-07-31 | Discharge: 2021-07-31 | Disposition: A | Discharge status: Home / Self Care | Payer: Medicare Other | Source: Ambulatory Visit | Attending: Radiation Oncology | Admitting: Radiation Oncology

## 2021-07-31 DIAGNOSIS — Z510 Encounter for antineoplastic radiation therapy: Secondary | ICD-10-CM | POA: Diagnosis not present

## 2021-07-31 DIAGNOSIS — C61 Malignant neoplasm of prostate: Secondary | ICD-10-CM | POA: Diagnosis not present

## 2021-08-02 ENCOUNTER — Ambulatory Visit
Admission: RE | Admit: 2021-08-02 | Discharge: 2021-08-02 | Disposition: A | Discharge status: Home / Self Care | Payer: Medicare Other | Source: Ambulatory Visit | Attending: Radiation Oncology | Admitting: Radiation Oncology

## 2021-08-02 DIAGNOSIS — C61 Malignant neoplasm of prostate: Secondary | ICD-10-CM | POA: Diagnosis not present

## 2021-08-02 DIAGNOSIS — Z510 Encounter for antineoplastic radiation therapy: Secondary | ICD-10-CM | POA: Diagnosis not present

## 2021-08-03 ENCOUNTER — Ambulatory Visit
Admission: RE | Admit: 2021-08-03 | Discharge: 2021-08-03 | Disposition: A | Discharge status: Home / Self Care | Payer: Medicare Other | Source: Ambulatory Visit | Attending: Radiation Oncology | Admitting: Radiation Oncology

## 2021-08-03 ENCOUNTER — Other Ambulatory Visit: Payer: Self-pay

## 2021-08-03 DIAGNOSIS — Z510 Encounter for antineoplastic radiation therapy: Secondary | ICD-10-CM | POA: Diagnosis not present

## 2021-08-03 DIAGNOSIS — C61 Malignant neoplasm of prostate: Secondary | ICD-10-CM | POA: Diagnosis not present

## 2021-08-04 ENCOUNTER — Ambulatory Visit: Payer: Medicare Other

## 2021-08-05 ENCOUNTER — Ambulatory Visit: Payer: Medicare Other

## 2021-08-10 ENCOUNTER — Other Ambulatory Visit: Payer: Self-pay

## 2021-08-10 ENCOUNTER — Ambulatory Visit
Admission: RE | Admit: 2021-08-10 | Discharge: 2021-08-10 | Disposition: A | Discharge status: Home / Self Care | Payer: Medicare Other | Source: Ambulatory Visit | Attending: Radiation Oncology | Admitting: Radiation Oncology

## 2021-08-10 DIAGNOSIS — Z510 Encounter for antineoplastic radiation therapy: Secondary | ICD-10-CM | POA: Diagnosis not present

## 2021-08-10 DIAGNOSIS — C61 Malignant neoplasm of prostate: Secondary | ICD-10-CM | POA: Diagnosis not present

## 2021-08-11 ENCOUNTER — Ambulatory Visit
Admission: RE | Admit: 2021-08-11 | Discharge: 2021-08-11 | Disposition: A | Discharge status: Home / Self Care | Payer: Medicare Other | Source: Ambulatory Visit | Attending: Radiation Oncology | Admitting: Radiation Oncology

## 2021-08-11 DIAGNOSIS — C61 Malignant neoplasm of prostate: Secondary | ICD-10-CM | POA: Diagnosis not present

## 2021-08-11 DIAGNOSIS — Z510 Encounter for antineoplastic radiation therapy: Secondary | ICD-10-CM | POA: Diagnosis not present

## 2021-08-12 ENCOUNTER — Other Ambulatory Visit: Payer: Self-pay

## 2021-08-12 ENCOUNTER — Ambulatory Visit
Admission: RE | Admit: 2021-08-12 | Discharge: 2021-08-12 | Disposition: A | Discharge status: Home / Self Care | Payer: Medicare Other | Source: Ambulatory Visit | Attending: Radiation Oncology | Admitting: Radiation Oncology

## 2021-08-12 DIAGNOSIS — C61 Malignant neoplasm of prostate: Secondary | ICD-10-CM | POA: Diagnosis not present

## 2021-08-12 DIAGNOSIS — Z510 Encounter for antineoplastic radiation therapy: Secondary | ICD-10-CM | POA: Diagnosis not present

## 2021-08-13 ENCOUNTER — Ambulatory Visit
Admission: RE | Admit: 2021-08-13 | Discharge: 2021-08-13 | Disposition: A | Discharge status: Home / Self Care | Payer: Medicare Other | Source: Ambulatory Visit | Attending: Radiation Oncology | Admitting: Radiation Oncology

## 2021-08-13 DIAGNOSIS — C61 Malignant neoplasm of prostate: Secondary | ICD-10-CM | POA: Diagnosis not present

## 2021-08-13 DIAGNOSIS — Z510 Encounter for antineoplastic radiation therapy: Secondary | ICD-10-CM | POA: Diagnosis not present

## 2021-08-14 ENCOUNTER — Ambulatory Visit
Admission: RE | Admit: 2021-08-14 | Discharge: 2021-08-14 | Disposition: A | Discharge status: Home / Self Care | Payer: Medicare Other | Source: Ambulatory Visit | Attending: Radiation Oncology | Admitting: Radiation Oncology

## 2021-08-14 ENCOUNTER — Other Ambulatory Visit: Payer: Self-pay

## 2021-08-14 DIAGNOSIS — Z510 Encounter for antineoplastic radiation therapy: Secondary | ICD-10-CM | POA: Diagnosis not present

## 2021-08-14 DIAGNOSIS — C61 Malignant neoplasm of prostate: Secondary | ICD-10-CM | POA: Diagnosis not present

## 2021-08-17 ENCOUNTER — Ambulatory Visit
Admission: RE | Admit: 2021-08-17 | Discharge: 2021-08-17 | Disposition: A | Discharge status: Home / Self Care | Payer: Medicare Other | Source: Ambulatory Visit | Attending: Radiation Oncology | Admitting: Radiation Oncology

## 2021-08-17 ENCOUNTER — Other Ambulatory Visit: Payer: Self-pay

## 2021-08-17 DIAGNOSIS — C61 Malignant neoplasm of prostate: Secondary | ICD-10-CM | POA: Diagnosis not present

## 2021-08-17 DIAGNOSIS — Z510 Encounter for antineoplastic radiation therapy: Secondary | ICD-10-CM | POA: Diagnosis not present

## 2021-08-18 ENCOUNTER — Ambulatory Visit
Admission: RE | Admit: 2021-08-18 | Discharge: 2021-08-18 | Disposition: A | Discharge status: Home / Self Care | Payer: Medicare Other | Source: Ambulatory Visit | Attending: Radiation Oncology | Admitting: Radiation Oncology

## 2021-08-18 DIAGNOSIS — Z510 Encounter for antineoplastic radiation therapy: Secondary | ICD-10-CM | POA: Diagnosis not present

## 2021-08-18 DIAGNOSIS — C61 Malignant neoplasm of prostate: Secondary | ICD-10-CM | POA: Diagnosis not present

## 2021-08-19 ENCOUNTER — Ambulatory Visit
Admission: RE | Admit: 2021-08-19 | Discharge: 2021-08-19 | Disposition: A | Discharge status: Home / Self Care | Payer: Medicare Other | Source: Ambulatory Visit | Attending: Radiation Oncology | Admitting: Radiation Oncology

## 2021-08-19 ENCOUNTER — Other Ambulatory Visit: Payer: Self-pay

## 2021-08-19 DIAGNOSIS — Z510 Encounter for antineoplastic radiation therapy: Secondary | ICD-10-CM | POA: Diagnosis not present

## 2021-08-19 DIAGNOSIS — C61 Malignant neoplasm of prostate: Secondary | ICD-10-CM | POA: Diagnosis not present

## 2021-08-20 ENCOUNTER — Ambulatory Visit
Admission: RE | Admit: 2021-08-20 | Discharge: 2021-08-20 | Disposition: A | Discharge status: Home / Self Care | Payer: Medicare Other | Source: Ambulatory Visit | Attending: Radiation Oncology | Admitting: Radiation Oncology

## 2021-08-20 DIAGNOSIS — C61 Malignant neoplasm of prostate: Secondary | ICD-10-CM | POA: Diagnosis not present

## 2021-08-20 DIAGNOSIS — Z510 Encounter for antineoplastic radiation therapy: Secondary | ICD-10-CM | POA: Diagnosis not present

## 2021-08-21 ENCOUNTER — Ambulatory Visit
Admission: RE | Admit: 2021-08-21 | Discharge: 2021-08-21 | Disposition: A | Discharge status: Home / Self Care | Payer: Medicare Other | Source: Ambulatory Visit | Attending: Radiation Oncology | Admitting: Radiation Oncology

## 2021-08-21 ENCOUNTER — Other Ambulatory Visit: Payer: Self-pay

## 2021-08-21 DIAGNOSIS — Z510 Encounter for antineoplastic radiation therapy: Secondary | ICD-10-CM | POA: Diagnosis not present

## 2021-08-21 DIAGNOSIS — C61 Malignant neoplasm of prostate: Secondary | ICD-10-CM | POA: Diagnosis not present

## 2021-08-24 ENCOUNTER — Ambulatory Visit
Admission: RE | Admit: 2021-08-24 | Discharge: 2021-08-24 | Disposition: A | Discharge status: Home / Self Care | Payer: Medicare Other | Source: Ambulatory Visit | Attending: Radiation Oncology | Admitting: Radiation Oncology

## 2021-08-24 ENCOUNTER — Other Ambulatory Visit: Payer: Self-pay

## 2021-08-24 DIAGNOSIS — C61 Malignant neoplasm of prostate: Secondary | ICD-10-CM | POA: Diagnosis not present

## 2021-08-24 DIAGNOSIS — Z510 Encounter for antineoplastic radiation therapy: Secondary | ICD-10-CM | POA: Diagnosis not present

## 2021-08-25 ENCOUNTER — Ambulatory Visit
Admission: RE | Admit: 2021-08-25 | Discharge: 2021-08-25 | Disposition: A | Discharge status: Home / Self Care | Payer: Medicare Other | Source: Ambulatory Visit | Attending: Radiation Oncology | Admitting: Radiation Oncology

## 2021-08-25 DIAGNOSIS — Z510 Encounter for antineoplastic radiation therapy: Secondary | ICD-10-CM | POA: Diagnosis not present

## 2021-08-25 DIAGNOSIS — C61 Malignant neoplasm of prostate: Secondary | ICD-10-CM | POA: Diagnosis not present

## 2021-08-26 ENCOUNTER — Other Ambulatory Visit: Payer: Self-pay

## 2021-08-26 ENCOUNTER — Ambulatory Visit
Admission: RE | Admit: 2021-08-26 | Discharge: 2021-08-26 | Disposition: A | Discharge status: Home / Self Care | Payer: Medicare Other | Source: Ambulatory Visit | Attending: Radiation Oncology | Admitting: Radiation Oncology

## 2021-08-26 DIAGNOSIS — C61 Malignant neoplasm of prostate: Secondary | ICD-10-CM | POA: Diagnosis not present

## 2021-08-26 DIAGNOSIS — Z510 Encounter for antineoplastic radiation therapy: Secondary | ICD-10-CM | POA: Diagnosis not present

## 2021-08-27 ENCOUNTER — Ambulatory Visit
Admission: RE | Admit: 2021-08-27 | Discharge: 2021-08-27 | Disposition: A | Discharge status: Home / Self Care | Payer: Medicare Other | Source: Ambulatory Visit | Attending: Radiation Oncology | Admitting: Radiation Oncology

## 2021-08-27 DIAGNOSIS — C61 Malignant neoplasm of prostate: Secondary | ICD-10-CM | POA: Diagnosis not present

## 2021-08-27 DIAGNOSIS — Z510 Encounter for antineoplastic radiation therapy: Secondary | ICD-10-CM | POA: Diagnosis not present

## 2021-08-28 ENCOUNTER — Ambulatory Visit
Admission: RE | Admit: 2021-08-28 | Discharge: 2021-08-28 | Disposition: A | Discharge status: Home / Self Care | Payer: Medicare Other | Source: Ambulatory Visit | Attending: Radiation Oncology | Admitting: Radiation Oncology

## 2021-08-28 ENCOUNTER — Other Ambulatory Visit: Payer: Self-pay

## 2021-08-28 DIAGNOSIS — C61 Malignant neoplasm of prostate: Secondary | ICD-10-CM | POA: Diagnosis not present

## 2021-08-28 DIAGNOSIS — Z510 Encounter for antineoplastic radiation therapy: Secondary | ICD-10-CM | POA: Diagnosis not present

## 2021-08-31 ENCOUNTER — Ambulatory Visit
Admission: RE | Admit: 2021-08-31 | Discharge: 2021-08-31 | Disposition: A | Discharge status: Home / Self Care | Payer: Medicare Other | Source: Ambulatory Visit | Attending: Radiation Oncology | Admitting: Radiation Oncology

## 2021-08-31 ENCOUNTER — Other Ambulatory Visit: Payer: Self-pay

## 2021-08-31 DIAGNOSIS — C61 Malignant neoplasm of prostate: Secondary | ICD-10-CM | POA: Diagnosis not present

## 2021-08-31 DIAGNOSIS — Z510 Encounter for antineoplastic radiation therapy: Secondary | ICD-10-CM | POA: Diagnosis not present

## 2021-09-01 ENCOUNTER — Ambulatory Visit
Admission: RE | Admit: 2021-09-01 | Discharge: 2021-09-01 | Disposition: A | Discharge status: Home / Self Care | Payer: Medicare Other | Source: Ambulatory Visit | Attending: Radiation Oncology | Admitting: Radiation Oncology

## 2021-09-01 DIAGNOSIS — Z510 Encounter for antineoplastic radiation therapy: Secondary | ICD-10-CM | POA: Diagnosis not present

## 2021-09-01 DIAGNOSIS — C61 Malignant neoplasm of prostate: Secondary | ICD-10-CM | POA: Diagnosis not present

## 2021-09-02 ENCOUNTER — Ambulatory Visit
Admission: RE | Admit: 2021-09-02 | Discharge: 2021-09-02 | Disposition: A | Discharge status: Home / Self Care | Payer: Medicare Other | Source: Ambulatory Visit | Attending: Radiation Oncology | Admitting: Radiation Oncology

## 2021-09-02 ENCOUNTER — Other Ambulatory Visit: Payer: Self-pay

## 2021-09-02 DIAGNOSIS — Z510 Encounter for antineoplastic radiation therapy: Secondary | ICD-10-CM | POA: Diagnosis not present

## 2021-09-02 DIAGNOSIS — C61 Malignant neoplasm of prostate: Secondary | ICD-10-CM | POA: Diagnosis not present

## 2021-09-03 ENCOUNTER — Ambulatory Visit: Payer: Medicare Other

## 2021-09-03 ENCOUNTER — Ambulatory Visit
Admission: RE | Admit: 2021-09-03 | Discharge: 2021-09-03 | Disposition: A | Discharge status: Home / Self Care | Payer: Medicare Other | Source: Ambulatory Visit | Attending: Radiation Oncology | Admitting: Radiation Oncology

## 2021-09-03 DIAGNOSIS — Z510 Encounter for antineoplastic radiation therapy: Secondary | ICD-10-CM | POA: Diagnosis not present

## 2021-09-03 DIAGNOSIS — C61 Malignant neoplasm of prostate: Secondary | ICD-10-CM | POA: Diagnosis not present

## 2021-09-04 ENCOUNTER — Ambulatory Visit: Payer: Medicare Other

## 2021-09-04 ENCOUNTER — Other Ambulatory Visit: Payer: Self-pay

## 2021-09-04 ENCOUNTER — Ambulatory Visit
Admission: RE | Admit: 2021-09-04 | Discharge: 2021-09-04 | Disposition: A | Discharge status: Home / Self Care | Payer: Medicare Other | Source: Ambulatory Visit | Attending: Radiation Oncology | Admitting: Radiation Oncology

## 2021-09-04 DIAGNOSIS — C61 Malignant neoplasm of prostate: Secondary | ICD-10-CM | POA: Diagnosis not present

## 2021-09-04 DIAGNOSIS — Z510 Encounter for antineoplastic radiation therapy: Secondary | ICD-10-CM | POA: Diagnosis not present

## 2021-09-08 ENCOUNTER — Other Ambulatory Visit: Payer: Self-pay

## 2021-09-08 ENCOUNTER — Encounter: Payer: Self-pay | Admitting: Urology

## 2021-09-08 ENCOUNTER — Ambulatory Visit
Admission: RE | Admit: 2021-09-08 | Discharge: 2021-09-08 | Disposition: A | Discharge status: Home / Self Care | Payer: Medicare Other | Source: Ambulatory Visit | Attending: Radiation Oncology | Admitting: Radiation Oncology

## 2021-09-08 DIAGNOSIS — Z510 Encounter for antineoplastic radiation therapy: Secondary | ICD-10-CM | POA: Diagnosis not present

## 2021-09-08 DIAGNOSIS — C61 Malignant neoplasm of prostate: Secondary | ICD-10-CM | POA: Diagnosis not present

## 2021-10-06 ENCOUNTER — Encounter: Payer: Self-pay | Admitting: Urology

## 2021-10-06 NOTE — Progress Notes (Addendum)
Spoke w/ patient, verified identity, and begin nursing interview. Patient reports polyuria. No other symptoms reported at this time.  Meaningful use complete. I-PSS score of 3 (mild). No current urinary management medications. Urology follow-up February 27th, 2023  Patient notified of 10:00am-10/14/21 telephone appointment w/ Ashlyn Bruning PA-C. I left my extension [phone] in case patient needs to call. Patient verbalized understanding of information given.  Patient contact [phone]

## 2021-10-14 ENCOUNTER — Ambulatory Visit
Admission: RE | Admit: 2021-10-14 | Discharge: 2021-10-14 | Disposition: A | Discharge status: Home / Self Care | Payer: Medicare Other | Source: Ambulatory Visit | Attending: Urology | Admitting: Urology

## 2021-10-14 DIAGNOSIS — C61 Malignant neoplasm of prostate: Secondary | ICD-10-CM

## 2021-10-14 NOTE — Progress Notes (Signed)
°  Radiation Oncology         [phone] ________________________________  Name: Omar Haley MRN: [id number]  Date: 09/08/2021  DOB: 1931/04/25  End of Treatment Note  Diagnosis:   88 y.o. gentleman with Stage T1c adenocarcinoma of the prostate with Gleason score of 4+5, and PSA of 25.9.     Indication for treatment:  Curative, Definitive Radiotherapy       Radiation treatment dates:   07/09/21 - 09/08/21; concurrent with LT-ADT  Site/dose:  1. The prostate, seminal vesicles, and pelvic lymph nodes were initially treated to 45 Gy in 25 fractions of 1.8 Gy  2. The prostate only was boosted to 75 Gy with 15 additional fractions of 2.0 Gy  Beams/energy:  1. The prostate, seminal vesicles, and pelvic lymph nodes were initially treated using VMAT intensity modulated radiotherapy delivering 6 megavolt photons. Image guidance was performed with CB-CT studies prior to each fraction. He was immobilized with a body fix lower extremity mold.  2. the prostate only was boosted using VMAT intensity modulated radiotherapy delivering 6 megavolt photons. Image guidance was performed with CB-CT studies prior to each fraction. He was immobilized with a body fix lower extremity mold.  Narrative: The patient tolerated radiation treatment relatively well with only minor urinary irritation and modest fatigue.  He did experience increased frequency, urgency and hesitancy but specifically denied gross hematuria, dysuria or incontinence.  He also reported soft stools but denied abdominal pain or diarrhea.  Plan: The patient has completed radiation treatment. He will return to radiation oncology clinic for routine followup in one month. I advised him to call or return sooner if he has any questions or concerns related to his recovery or treatment. ________________________________  Sheral Apley. Tammi Klippel, M.D.

## 2021-10-14 NOTE — Progress Notes (Signed)
Radiation Oncology         [phone]) [phone] ________________________________  Name: Gurshaan Matsuoka MRN: [id number]  Date: 10/14/2021  DOB: 06/08/1931  Post Treatment Note  CC: [name], MD  Festus Aloe, MD  Diagnosis:   88 y.o. gentleman with Stage T1c adenocarcinoma of the prostate with Gleason score of 4+5, and PSA of 25.9.     Interval Since Last Radiation:  5 weeks  07/09/21 - 09/08/21; concurrent with LT-ADT: 1. The prostate, seminal vesicles, and pelvic lymph nodes were initially treated to 45 Gy in 25 fractions of 1.8 Gy  2. The prostate only was boosted to 75 Gy with 15 additional fractions of 2.0 Gy  Narrative:  I spoke with the patient to conduct his routine scheduled 1 month follow up visit via telephone to spare the patient unnecessary potential exposure in the healthcare setting during the current COVID-19 pandemic.  The patient was notified in advance and gave permission to proceed with this visit format.  He tolerated radiation treatment relatively well with only minor urinary irritation and modest fatigue.  He did experience increased frequency, urgency and hesitancy but specifically denied gross hematuria, dysuria or incontinence.  He also reported soft stools but denied abdominal pain or diarrhea.                              On review of systems, the patient states that he is doing very well in general.  He has noticed gradual improvement in his LUTS and reports that his bowel movements have returned to normal.  He specifically denies dysuria, gross hematuria, straining to void, incomplete bladder emptying or incontinence.  He continues with some increased frequency, urgency and occasional weaker flow of stream but notes that this is gradually improving.  He reports a healthy appetite and is maintaining his weight.  He denies any abdominal pain, nausea, vomiting, diarrhea or constipation.  He does have occasional hot flashes and some decreased stamina but overall,  feels he tolerates the ADT well and is pleased with his progress to date.    ALLERGIES:  is allergic to enalapril, gemfibrozil, micardis [telmisartan], and zetia [ezetimibe].  Meds: Current Outpatient Medications  Medication Sig Dispense Refill   Acetaminophen (TYLENOL ARTHRITIS PAIN PO) Take by mouth.     amLODipine (NORVASC) 2.5 MG tablet TK 1 T PO D (Patient not taking: Reported on 06/30/2021)     Aspirin 81 MG EC tablet Take 81 mg by mouth daily. (Patient not taking: Reported on 04/13/2021)     bimatoprost (LUMIGAN) 0.01 % SOLN PLACE 1 DROP INTO BOTH EYES AT BEDTIME (Patient not taking: Reported on 06/30/2021)     calcium carbonate (OS-CAL - DOSED IN MG OF ELEMENTAL CALCIUM) 1250 (500 Ca) MG tablet Take 1 tablet by mouth.     Cholecalciferol (D-3-5) 125 MCG (5000 UT) capsule Take 5,000 Units by mouth daily.     COVID-19 mRNA Vac-TriS, Pfizer, SUSP injection Inject into the muscle. (Patient not taking: Reported on 04/13/2021) 0.3 mL 0   Cyanocobalamin (VITAMIN B 12 PO) Take 5,000 mcg by mouth daily.     ergocalciferol (VITAMIN D2) 50000 UNITS capsule Take 50,000 Units by mouth once a week. (Patient not taking: Reported on 04/13/2021)     hydrocortisone (ANUSOL-HC) 25 MG suppository Place 1 suppository (25 mg total) rectally at bedtime. (Patient not taking: Reported on 04/13/2021) 12 suppository 0   ibuprofen (ADVIL,MOTRIN) 200 MG tablet Take 200 mg by  mouth every 4 (four) hours as needed for pain. (Patient not taking: Reported on 06/30/2021)     latanoprost (XALATAN) 0.005 % ophthalmic solution Place 1 drop into both eyes at bedtime.     levothyroxine (SYNTHROID) 50 MCG tablet TK 1 T PO D     nitroGLYCERIN (NITROSTAT) 0.4 MG SL tablet Place 0.4 mg under the tongue every 5 (five) minutes as needed for chest pain. (Patient not taking: Reported on 04/13/2021)     olmesartan-hydrochlorothiazide (BENICAR HCT) 40-25 MG tablet Take 1 tablet by mouth daily.     simvastatin (ZOCOR) 20 MG tablet Take 20 mg by  mouth every evening. (Patient not taking: Reported on 04/13/2021)     Travoprost, BAK Free, (TRAVATAN) 0.004 % SOLN ophthalmic solution 1 drop at bedtime. (Patient not taking: Reported on 06/30/2021)     No current facility-administered medications for this encounter.    Physical Findings:  vitals were not taken for this visit.  Pain Assessment Pain Score: 0-No pain/10 Unable to assess due to telephone follow-up visit format.   Lab Findings: Lab Results  Component Value Date   WBC 8.2 04/28/2011   HGB 14.2 04/28/2011   HCT 41.8 04/28/2011   MCV 85.5 04/28/2011   PLT 146 (L) 04/28/2011     Radiographic Findings: No results found.  Impression/Plan: 1. 88 y.o. gentleman with Stage T1c adenocarcinoma of the prostate with Gleason score of 4+5, and PSA of 25.9.    He will continue to follow up with urology for ongoing PSA determinations and has an appointment scheduled with Dr. Junious Silk on 11/09/2021 but he was not aware of this appointment and it is unclear whether this is a lab visit or office visit with Dr. Junious Silk.  We will have Dr. Lyndal Rainbow office reach out to the patient to clarify/confirm his upcoming appointments.  He understands what to expect with regards to PSA monitoring going forward. I will look forward to following his response to treatment via correspondence with urology, and would be happy to continue to participate in his care if clinically indicated. I talked to the patient about what to expect in the future and answered all of his questions to his stated satisfaction. I encouraged him to call or return to the office if he has any questions regarding his previous radiation or possible radiation side effects. He was comfortable with this plan and will follow up as needed.     Nicholos Johns, PA-C

## 2021-10-23 DIAGNOSIS — Z1283 Encounter for screening for malignant neoplasm of skin: Secondary | ICD-10-CM | POA: Diagnosis not present

## 2021-10-23 DIAGNOSIS — C44629 Squamous cell carcinoma of skin of left upper limb, including shoulder: Secondary | ICD-10-CM | POA: Diagnosis not present

## 2021-10-23 DIAGNOSIS — Z08 Encounter for follow-up examination after completed treatment for malignant neoplasm: Secondary | ICD-10-CM | POA: Diagnosis not present

## 2021-10-23 DIAGNOSIS — D225 Melanocytic nevi of trunk: Secondary | ICD-10-CM | POA: Diagnosis not present

## 2021-10-23 DIAGNOSIS — Z85820 Personal history of malignant melanoma of skin: Secondary | ICD-10-CM | POA: Diagnosis not present

## 2021-10-23 DIAGNOSIS — X32XXXD Exposure to sunlight, subsequent encounter: Secondary | ICD-10-CM | POA: Diagnosis not present

## 2021-10-23 DIAGNOSIS — L570 Actinic keratosis: Secondary | ICD-10-CM | POA: Diagnosis not present

## 2021-11-09 DIAGNOSIS — C61 Malignant neoplasm of prostate: Secondary | ICD-10-CM | POA: Diagnosis not present

## 2021-11-17 DIAGNOSIS — E039 Hypothyroidism, unspecified: Secondary | ICD-10-CM | POA: Diagnosis not present

## 2021-11-17 DIAGNOSIS — E1169 Type 2 diabetes mellitus with other specified complication: Secondary | ICD-10-CM | POA: Diagnosis not present

## 2021-11-17 DIAGNOSIS — E785 Hyperlipidemia, unspecified: Secondary | ICD-10-CM | POA: Diagnosis not present

## 2021-11-17 DIAGNOSIS — E559 Vitamin D deficiency, unspecified: Secondary | ICD-10-CM | POA: Diagnosis not present

## 2021-11-17 DIAGNOSIS — Z125 Encounter for screening for malignant neoplasm of prostate: Secondary | ICD-10-CM | POA: Diagnosis not present

## 2021-11-24 DIAGNOSIS — Z1339 Encounter for screening examination for other mental health and behavioral disorders: Secondary | ICD-10-CM | POA: Diagnosis not present

## 2021-11-24 DIAGNOSIS — E785 Hyperlipidemia, unspecified: Secondary | ICD-10-CM | POA: Diagnosis not present

## 2021-11-24 DIAGNOSIS — E039 Hypothyroidism, unspecified: Secondary | ICD-10-CM | POA: Diagnosis not present

## 2021-11-24 DIAGNOSIS — R82998 Other abnormal findings in urine: Secondary | ICD-10-CM | POA: Diagnosis not present

## 2021-11-24 DIAGNOSIS — I10 Essential (primary) hypertension: Secondary | ICD-10-CM | POA: Diagnosis not present

## 2021-11-24 DIAGNOSIS — E559 Vitamin D deficiency, unspecified: Secondary | ICD-10-CM | POA: Diagnosis not present

## 2021-11-24 DIAGNOSIS — Z0000 Encounter for general adult medical examination without abnormal findings: Secondary | ICD-10-CM | POA: Diagnosis not present

## 2021-11-24 DIAGNOSIS — Z1331 Encounter for screening for depression: Secondary | ICD-10-CM | POA: Diagnosis not present

## 2021-11-24 DIAGNOSIS — N433 Hydrocele, unspecified: Secondary | ICD-10-CM | POA: Diagnosis not present

## 2021-11-24 DIAGNOSIS — E1169 Type 2 diabetes mellitus with other specified complication: Secondary | ICD-10-CM | POA: Diagnosis not present

## 2021-11-24 DIAGNOSIS — C61 Malignant neoplasm of prostate: Secondary | ICD-10-CM | POA: Diagnosis not present

## 2021-11-25 DIAGNOSIS — H26492 Other secondary cataract, left eye: Secondary | ICD-10-CM | POA: Diagnosis not present

## 2021-11-25 DIAGNOSIS — H40053 Ocular hypertension, bilateral: Secondary | ICD-10-CM | POA: Diagnosis not present

## 2022-01-06 DIAGNOSIS — L570 Actinic keratosis: Secondary | ICD-10-CM | POA: Diagnosis not present

## 2022-01-06 DIAGNOSIS — Z85828 Personal history of other malignant neoplasm of skin: Secondary | ICD-10-CM | POA: Diagnosis not present

## 2022-01-06 DIAGNOSIS — Z08 Encounter for follow-up examination after completed treatment for malignant neoplasm: Secondary | ICD-10-CM | POA: Diagnosis not present

## 2022-01-06 DIAGNOSIS — L814 Other melanin hyperpigmentation: Secondary | ICD-10-CM | POA: Diagnosis not present

## 2022-01-06 DIAGNOSIS — X32XXXD Exposure to sunlight, subsequent encounter: Secondary | ICD-10-CM | POA: Diagnosis not present

## 2022-01-06 DIAGNOSIS — C44329 Squamous cell carcinoma of skin of other parts of face: Secondary | ICD-10-CM | POA: Diagnosis not present

## 2022-01-11 IMAGING — CT NM PET TUM IMG SKULL BASE T - THIGH
1 of 7 series · 1 of 25 positions shown · non-contrast
Comparison: Abdominopelvic CT 05/23/2020

CLINICAL DATA: Recent diagnosis prostate cancer. Gleason score 4+5.
PSA of 25.9.

EXAM:
NUCLEAR MEDICINE PET SKULL BASE TO THIGH
TECHNIQUE: 9.2 mCi F18 Piflufolastat (Pylarify) was injected intravenously.
Full-ring PET imaging was performed from the skull base to thigh
after the radiotracer. CT data was obtained and used for attenuation
correction and anatomic localization.

[Series 4: ct sk_thigh 5.0 bf37 · axial · 5.0mm · 0.98mm/px · 1 of 251 slices shown]
[im 1/251  brain]
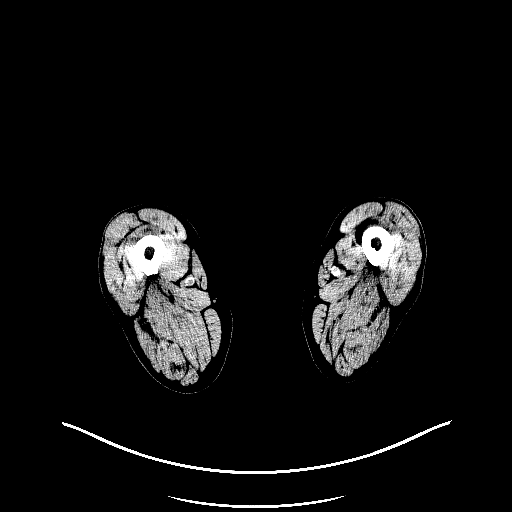

[1 of 25 positions shown; findings below may reference images not displayed]

FINDINGS: NECK

No radiotracer activity in neck lymph nodes.

Incidental CT finding: No cervical adenopathy.

CHEST

No tracer accumulation within thoracic nodes or pulmonary nodules.

Incidental CT finding: Fluid level in the lower cervical esophagus
including on 71/4, corresponds to Nenk Irma Wardani diverticulum on
01/03/2012 esophagram. Aortic and coronary artery calcification.
Small hiatal hernia. Partially calcified right lower lobe pulmonary
nodule of 8 mm is likely a granuloma.

ABDOMEN/PELVIS

Prostate: Slightly eccentric right anterior prostatic uptake at a
S.U.V. max of 31.8, including on 212/4.

Lymph nodes: No abnormal radiotracer accumulation within pelvic or
abdominal nodes.

Liver: No evidence of liver metastasis

Incidental CT finding: Suspect small gallstones. Mild left adrenal
thickening and nodularity. Normal right adrenal gland. Abdominal
aortic atherosclerosis. Right hemicolectomy. Fat containing left
inguinal hernia.

SKELETON

No focal  activity to suggest skeletal metastasis.
IMPRESSION: 1. Anterior right prostatic uptake, consistent with the site of
carcinoma.
2. No tracer avid nodal or distant metastasis.
3. Incidental findings, including: Coronary artery atherosclerosis.
Aortic Atherosclerosis ([id number]). Sign curves diverticulum.
Small hiatal hernia.

## 2022-02-03 DIAGNOSIS — C44329 Squamous cell carcinoma of skin of other parts of face: Secondary | ICD-10-CM | POA: Diagnosis not present

## 2022-02-03 DIAGNOSIS — D2239 Melanocytic nevi of other parts of face: Secondary | ICD-10-CM | POA: Diagnosis not present

## 2022-02-03 DIAGNOSIS — Z1283 Encounter for screening for malignant neoplasm of skin: Secondary | ICD-10-CM | POA: Diagnosis not present

## 2022-02-22 DIAGNOSIS — C61 Malignant neoplasm of prostate: Secondary | ICD-10-CM | POA: Diagnosis not present

## 2022-02-22 DIAGNOSIS — E785 Hyperlipidemia, unspecified: Secondary | ICD-10-CM | POA: Diagnosis not present

## 2022-02-22 DIAGNOSIS — E1169 Type 2 diabetes mellitus with other specified complication: Secondary | ICD-10-CM | POA: Diagnosis not present

## 2022-02-22 DIAGNOSIS — I10 Essential (primary) hypertension: Secondary | ICD-10-CM | POA: Diagnosis not present

## 2022-03-17 DIAGNOSIS — E1169 Type 2 diabetes mellitus with other specified complication: Secondary | ICD-10-CM | POA: Diagnosis not present

## 2022-03-17 DIAGNOSIS — T887XXA Unspecified adverse effect of drug or medicament, initial encounter: Secondary | ICD-10-CM | POA: Diagnosis not present

## 2022-04-14 DIAGNOSIS — L570 Actinic keratosis: Secondary | ICD-10-CM | POA: Diagnosis not present

## 2022-04-14 DIAGNOSIS — L820 Inflamed seborrheic keratosis: Secondary | ICD-10-CM | POA: Diagnosis not present

## 2022-04-14 DIAGNOSIS — C44622 Squamous cell carcinoma of skin of right upper limb, including shoulder: Secondary | ICD-10-CM | POA: Diagnosis not present

## 2022-04-14 DIAGNOSIS — X32XXXD Exposure to sunlight, subsequent encounter: Secondary | ICD-10-CM | POA: Diagnosis not present

## 2022-04-14 DIAGNOSIS — Z85828 Personal history of other malignant neoplasm of skin: Secondary | ICD-10-CM | POA: Diagnosis not present

## 2022-04-14 DIAGNOSIS — Z08 Encounter for follow-up examination after completed treatment for malignant neoplasm: Secondary | ICD-10-CM | POA: Diagnosis not present

## 2022-05-03 DIAGNOSIS — C61 Malignant neoplasm of prostate: Secondary | ICD-10-CM | POA: Diagnosis not present

## 2022-05-10 DIAGNOSIS — N5201 Erectile dysfunction due to arterial insufficiency: Secondary | ICD-10-CM | POA: Diagnosis not present

## 2022-05-10 DIAGNOSIS — C61 Malignant neoplasm of prostate: Secondary | ICD-10-CM | POA: Diagnosis not present

## 2022-05-28 DIAGNOSIS — H40053 Ocular hypertension, bilateral: Secondary | ICD-10-CM | POA: Diagnosis not present

## 2022-05-28 DIAGNOSIS — H26491 Other secondary cataract, right eye: Secondary | ICD-10-CM | POA: Diagnosis not present

## 2022-05-28 DIAGNOSIS — Z961 Presence of intraocular lens: Secondary | ICD-10-CM | POA: Diagnosis not present

## 2022-06-01 DIAGNOSIS — C61 Malignant neoplasm of prostate: Secondary | ICD-10-CM | POA: Diagnosis not present

## 2022-06-01 DIAGNOSIS — E1169 Type 2 diabetes mellitus with other specified complication: Secondary | ICD-10-CM | POA: Diagnosis not present

## 2022-06-01 DIAGNOSIS — E785 Hyperlipidemia, unspecified: Secondary | ICD-10-CM | POA: Diagnosis not present

## 2022-06-01 DIAGNOSIS — I10 Essential (primary) hypertension: Secondary | ICD-10-CM | POA: Diagnosis not present

## 2022-06-01 DIAGNOSIS — M25511 Pain in right shoulder: Secondary | ICD-10-CM | POA: Diagnosis not present

## 2022-06-11 DIAGNOSIS — Z23 Encounter for immunization: Secondary | ICD-10-CM | POA: Diagnosis not present

## 2022-08-23 DIAGNOSIS — M7989 Other specified soft tissue disorders: Secondary | ICD-10-CM | POA: Diagnosis not present

## 2022-08-23 DIAGNOSIS — I10 Essential (primary) hypertension: Secondary | ICD-10-CM | POA: Diagnosis not present

## 2022-08-23 DIAGNOSIS — I872 Venous insufficiency (chronic) (peripheral): Secondary | ICD-10-CM | POA: Diagnosis not present

## 2022-08-23 DIAGNOSIS — E1169 Type 2 diabetes mellitus with other specified complication: Secondary | ICD-10-CM | POA: Diagnosis not present

## 2022-10-08 DIAGNOSIS — L570 Actinic keratosis: Secondary | ICD-10-CM | POA: Diagnosis not present

## 2022-10-08 DIAGNOSIS — Z85820 Personal history of malignant melanoma of skin: Secondary | ICD-10-CM | POA: Diagnosis not present

## 2022-10-08 DIAGNOSIS — D225 Melanocytic nevi of trunk: Secondary | ICD-10-CM | POA: Diagnosis not present

## 2022-10-08 DIAGNOSIS — X32XXXD Exposure to sunlight, subsequent encounter: Secondary | ICD-10-CM | POA: Diagnosis not present

## 2022-10-08 DIAGNOSIS — D044 Carcinoma in situ of skin of scalp and neck: Secondary | ICD-10-CM | POA: Diagnosis not present

## 2022-10-08 DIAGNOSIS — Z1283 Encounter for screening for malignant neoplasm of skin: Secondary | ICD-10-CM | POA: Diagnosis not present

## 2022-10-08 DIAGNOSIS — Z08 Encounter for follow-up examination after completed treatment for malignant neoplasm: Secondary | ICD-10-CM | POA: Diagnosis not present

## 2022-11-04 DIAGNOSIS — C61 Malignant neoplasm of prostate: Secondary | ICD-10-CM | POA: Diagnosis not present

## 2022-11-09 DIAGNOSIS — E785 Hyperlipidemia, unspecified: Secondary | ICD-10-CM | POA: Diagnosis not present

## 2022-11-09 DIAGNOSIS — I10 Essential (primary) hypertension: Secondary | ICD-10-CM | POA: Diagnosis not present

## 2022-11-09 DIAGNOSIS — M25511 Pain in right shoulder: Secondary | ICD-10-CM | POA: Diagnosis not present

## 2022-11-09 DIAGNOSIS — R0602 Shortness of breath: Secondary | ICD-10-CM | POA: Diagnosis not present

## 2022-11-09 DIAGNOSIS — R55 Syncope and collapse: Secondary | ICD-10-CM | POA: Diagnosis not present

## 2022-11-09 DIAGNOSIS — R011 Cardiac murmur, unspecified: Secondary | ICD-10-CM | POA: Diagnosis not present

## 2022-11-09 DIAGNOSIS — E1169 Type 2 diabetes mellitus with other specified complication: Secondary | ICD-10-CM | POA: Diagnosis not present

## 2022-11-09 DIAGNOSIS — C61 Malignant neoplasm of prostate: Secondary | ICD-10-CM | POA: Diagnosis not present

## 2022-11-09 DIAGNOSIS — R0789 Other chest pain: Secondary | ICD-10-CM | POA: Diagnosis not present

## 2022-11-11 DIAGNOSIS — C61 Malignant neoplasm of prostate: Secondary | ICD-10-CM | POA: Diagnosis not present

## 2022-11-11 DIAGNOSIS — E349 Endocrine disorder, unspecified: Secondary | ICD-10-CM | POA: Diagnosis not present

## 2022-11-11 NOTE — Progress Notes (Signed)
Cardiology Office Note:    Date:  11/12/2022   ID:  Omar Haley, DOB 1931-03-31, MRN [id number]  PCP:  [name], MD   [facility] Cardiologist:  Lenna Sciara, MD Referring MD: [name], MD   Chief Complaint/Reason for Referral: Chest pain, presyncope, and systolic murmur  ASSESSMENT:    1. Dizziness   2. Precordial pain   3. Type 2 diabetes mellitus without complication, without long-term current use of insulin ([place])   4. Hypertension associated with diabetes ([place])   5. Hyperlipidemia associated with type 2 diabetes mellitus ([place])     PLAN:    In order of problems listed above: 1.  Presyncope: I suspect the patient has developed significant aortic stenosis.  Will obtain echocardiogram.  I will bring him back to discuss further if significant aortic valvular disease is confirmed.   2.  Chest pain: Possibly related to aortic stenosis.  If severe arctic stenosis is confirmed given his advanced age he would likely be best served by TAVR procedure and we will proceed with coronary angiography as part of her evaluation. 3.  Type 2 diabetes: Continue aspirin and losartan.  Given his advanced age I do not think a statin is necessary nor is [place]. 4.  Hypertension: Continue losartan.  His blood pressure is highly elevated however given what I suspect is severe aortic valvular disease we will allow this for now. 5.  Hyperlipidemia: Strictly speaking LDL goal for diabetic is less than 70 however given the patient's advanced age I do not think this is necessarily required.             Dispo:  Return in about 3 months (around 02/12/2023).      Medication Adjustments/Labs and Tests Ordered: Current medicines are reviewed at length with the patient today.  Concerns regarding medicines are outlined above.  The following changes have been made:  no change   Labs/tests ordered: Orders Placed This Encounter  Procedures   EKG 12-Lead   ECHOCARDIOGRAM  COMPLETE    Medication Changes: No orders of the defined types were placed in this encounter.    Current medicines are reviewed at length with the patient today.  The patient does not have concerns regarding medicines.   History of Present Illness:    FOCUSED PROBLEM LIST:   1.  Prostate cancer 2.  Diet-controlled diabetes 3.  Hypertension 4.  Hypothyroidism 5.  Hyperlipidemia 6.  Aortic atherosclerosis on CT scan 2021  The patient is a 89 y.o. male with the indicated medical history here for recommendations regarding chest pain, presyncope, and incidentally noted systolic murmur.  The patient was seen by his primary care provider recently.  He noted that when he was mowing his lawn he developed some chest discomfort.  He also noted apparently had a presyncopal episode when walking to the mailbox.  For this reason he was referred for cardiology evaluation.  The patient tells me that over the last few months he has noticed increasing dyspnea and occasional right-sided chest discomfort when he does more than moderate exertion such as mowing his lawn.  Also going up stairs sometimes causes.  He has noted some lightheadedness but no frank syncope as well.  None of this happens at rest.  He has had some mild dependent edema which is better after night sleep.  He denies any significant bleeding or bruising.  He has never had a stroke.  He does not smoke.  He sees a Pharmacist, community on a regular basis.  Current Medications: No outpatient medications have been marked as taking for the 11/12/22 encounter (Office Visit) with Early Osmond, MD.     Allergies:    Enalapril, Gemfibrozil, Micardis [telmisartan], and Zetia [ezetimibe]   Social History:   Social History   Tobacco Use   Smoking status: Never  Substance Use Topics   Alcohol use: No   Drug use: No     Family Hx: Family History  Problem Relation Age of Onset   Heart failure Mother    Cerebral aneurysm Father       Review of Systems:   Please see the history of present illness.    All other systems reviewed and are negative.     EKGs/Labs/Other Test Reviewed:    EKG:  EKG performed 2012 that I personally reviewed demonstrates this rhythm with PVCs; EKG performed today that I personally reviewed demonstrates sinus rhythm with left bundle branch block.  Prior CV studies:     Other studies Reviewed: Review of the additional studies/records demonstrates: CT abdomen pelvis 2021 demonstrates aortic atherosclerosis  Recent Labs: No results found for requested labs within last 365 days.   Recent Lipid Panel Lab Results  Component Value Date/Time   CHOL 213 (H) 04/29/2011 02:59 AM   TRIG 182 (H) 04/29/2011 02:59 AM   HDL 40 04/29/2011 02:59 AM   LDLCALC 137 (H) 04/29/2011 02:59 AM    Risk Assessment/Calculations:             Physical Exam:    VS:  BP (!) 160/80   Pulse 91   Wt 161 lb 6.4 oz (73.2 kg)   SpO2 96%   BMI 23.83 kg/m     HYPERTENSION CONTROL Vitals:   11/12/22 0932 11/12/22 0934  BP: (!) 169/83 (!) 160/80    The patient's blood pressure is elevated above target today.  In order to address the patient's elevated BP: Blood pressure will be monitored at home to determine if medication changes need to be made.       Wt Readings from Last 3 Encounters:  11/12/22 161 lb 6.4 oz (73.2 kg)  04/13/21 163 lb 9.6 oz (74.2 kg)  07/26/13 164 lb 9.6 oz (74.7 kg)    GENERAL:  No apparent distress, AOx3 HEENT:  No carotid bruits, +2 carotid impulses, no scleral icterus CAR: RRR with 3/6 crescendo decrescendo murmur and no gallops, rubs, or thrills RES:  Clear to auscultation bilaterally ABD:  Soft, nontender, nondistended, positive bowel sounds x 4 VASC:  +2 radial pulses, +2 carotid pulses, palpable pedal pulses NEURO:  CN 2-12 grossly intact; motor and sensory grossly intact PSYCH:  No active depression or anxiety EXT:  No edema, ecchymosis, or  cyanosis  Signed, Early Osmond, MD  11/12/2022 9:35 AM    [address] [address], [address], [place]  [address] Phone: [phone]; Fax: [phone]   Note:  This document was prepared using Dragon voice recognition software and may include unintentional dictation errors.

## 2022-11-12 ENCOUNTER — Ambulatory Visit: Payer: Medicare Other | Attending: Internal Medicine | Admitting: Internal Medicine

## 2022-11-12 ENCOUNTER — Encounter: Payer: Self-pay | Admitting: Internal Medicine

## 2022-11-12 VITALS — BP 160/80 | HR 91 | Wt 161.4 lb

## 2022-11-12 DIAGNOSIS — R55 Syncope and collapse: Secondary | ICD-10-CM

## 2022-11-12 DIAGNOSIS — E119 Type 2 diabetes mellitus without complications: Secondary | ICD-10-CM | POA: Diagnosis not present

## 2022-11-12 DIAGNOSIS — E785 Hyperlipidemia, unspecified: Secondary | ICD-10-CM | POA: Insufficient documentation

## 2022-11-12 DIAGNOSIS — E1169 Type 2 diabetes mellitus with other specified complication: Secondary | ICD-10-CM | POA: Diagnosis not present

## 2022-11-12 DIAGNOSIS — R42 Dizziness and giddiness: Secondary | ICD-10-CM | POA: Diagnosis not present

## 2022-11-12 DIAGNOSIS — R072 Precordial pain: Secondary | ICD-10-CM | POA: Diagnosis not present

## 2022-11-12 DIAGNOSIS — I152 Hypertension secondary to endocrine disorders: Secondary | ICD-10-CM | POA: Diagnosis not present

## 2022-11-12 DIAGNOSIS — E1159 Type 2 diabetes mellitus with other circulatory complications: Secondary | ICD-10-CM

## 2022-11-12 NOTE — Patient Instructions (Signed)
Medication Instructions:  Your physician recommends that you continue on your current medications as directed. Please refer to the Current Medication list given to you today.  *If you need a refill on your cardiac medications before your next appointment, please call your pharmacy*   Lab Work:  If you have labs (blood work) drawn today and your tests are completely normal, you will receive your results only by: [place] (if you have MyChart) OR A paper copy in the mail If you have any lab test that is abnormal or we need to change your treatment, we will call you to review the results.   Testing/Procedures: Your physician has requested that you have an echocardiogram. Echocardiography is a painless test that uses sound waves to create images of your heart. It provides your doctor with information about the size and shape of your heart and how well your heart's chambers and valves are working. This procedure takes approximately one hour. There are no restrictions for this procedure. Please do NOT wear cologne, perfume, aftershave, or lotions (deodorant is allowed). Please arrive 15 minutes prior to your appointment time.   Follow-Up: At [facility], you and your health needs are our priority.  As part of our continuing mission to provide you with exceptional heart care, we have created designated Provider Care Teams.  These Care Teams include your primary Cardiologist (physician) and Advanced Practice Providers (APPs -  Physician Assistants and Nurse Practitioners) who all work together to provide you with the care you need, when you need it.  We recommend signing up for the patient portal called "MyChart".  Sign up information is provided on this After Visit Summary.  MyChart is used to connect with patients for Virtual Visits (Telemedicine).  Patients are able to view lab/test results, encounter notes, upcoming appointments, etc.  Non-urgent messages can be sent to your  provider as well.   To learn more about what you can do with MyChart, go to [email or web address].    Your next appointment:   3 month(s)  Provider:   APP

## 2022-11-23 DIAGNOSIS — R7989 Other specified abnormal findings of blood chemistry: Secondary | ICD-10-CM | POA: Diagnosis not present

## 2022-11-23 DIAGNOSIS — E1169 Type 2 diabetes mellitus with other specified complication: Secondary | ICD-10-CM | POA: Diagnosis not present

## 2022-11-23 DIAGNOSIS — I10 Essential (primary) hypertension: Secondary | ICD-10-CM | POA: Diagnosis not present

## 2022-11-23 DIAGNOSIS — E785 Hyperlipidemia, unspecified: Secondary | ICD-10-CM | POA: Diagnosis not present

## 2022-11-23 DIAGNOSIS — E039 Hypothyroidism, unspecified: Secondary | ICD-10-CM | POA: Diagnosis not present

## 2022-11-23 DIAGNOSIS — E559 Vitamin D deficiency, unspecified: Secondary | ICD-10-CM | POA: Diagnosis not present

## 2022-11-24 DIAGNOSIS — L820 Inflamed seborrheic keratosis: Secondary | ICD-10-CM | POA: Diagnosis not present

## 2022-11-24 DIAGNOSIS — L570 Actinic keratosis: Secondary | ICD-10-CM | POA: Diagnosis not present

## 2022-11-24 DIAGNOSIS — X32XXXD Exposure to sunlight, subsequent encounter: Secondary | ICD-10-CM | POA: Diagnosis not present

## 2022-11-24 DIAGNOSIS — Z08 Encounter for follow-up examination after completed treatment for malignant neoplasm: Secondary | ICD-10-CM | POA: Diagnosis not present

## 2022-11-24 DIAGNOSIS — Z85828 Personal history of other malignant neoplasm of skin: Secondary | ICD-10-CM | POA: Diagnosis not present

## 2022-11-30 DIAGNOSIS — R55 Syncope and collapse: Secondary | ICD-10-CM | POA: Diagnosis not present

## 2022-11-30 DIAGNOSIS — E785 Hyperlipidemia, unspecified: Secondary | ICD-10-CM | POA: Diagnosis not present

## 2022-11-30 DIAGNOSIS — C61 Malignant neoplasm of prostate: Secondary | ICD-10-CM | POA: Diagnosis not present

## 2022-11-30 DIAGNOSIS — E039 Hypothyroidism, unspecified: Secondary | ICD-10-CM | POA: Diagnosis not present

## 2022-11-30 DIAGNOSIS — E1169 Type 2 diabetes mellitus with other specified complication: Secondary | ICD-10-CM | POA: Diagnosis not present

## 2022-11-30 DIAGNOSIS — R011 Cardiac murmur, unspecified: Secondary | ICD-10-CM | POA: Diagnosis not present

## 2022-11-30 DIAGNOSIS — Z0000 Encounter for general adult medical examination without abnormal findings: Secondary | ICD-10-CM | POA: Diagnosis not present

## 2022-11-30 DIAGNOSIS — R0789 Other chest pain: Secondary | ICD-10-CM | POA: Diagnosis not present

## 2022-11-30 DIAGNOSIS — Z1331 Encounter for screening for depression: Secondary | ICD-10-CM | POA: Diagnosis not present

## 2022-11-30 DIAGNOSIS — R0602 Shortness of breath: Secondary | ICD-10-CM | POA: Diagnosis not present

## 2022-11-30 DIAGNOSIS — I10 Essential (primary) hypertension: Secondary | ICD-10-CM | POA: Diagnosis not present

## 2022-11-30 DIAGNOSIS — Z1339 Encounter for screening examination for other mental health and behavioral disorders: Secondary | ICD-10-CM | POA: Diagnosis not present

## 2022-12-14 ENCOUNTER — Ambulatory Visit (HOSPITAL_COMMUNITY): Payer: Medicare Other | Attending: Internal Medicine

## 2022-12-14 DIAGNOSIS — R072 Precordial pain: Secondary | ICD-10-CM | POA: Insufficient documentation

## 2022-12-14 DIAGNOSIS — R42 Dizziness and giddiness: Secondary | ICD-10-CM | POA: Diagnosis not present

## 2022-12-14 LAB — ECHOCARDIOGRAM COMPLETE
AR max vel: 1.05 cm2
AV Area VTI: 0.95 cm2
AV Area mean vel: 0.88 cm2
AV Mean grad: 37 mmHg
AV Peak grad: 62.1 mmHg
Ao pk vel: 3.94 m/s
Area-P 1/2: 3.81 cm2
P 1/2 time: 405 msec
S' Lateral: 4.2 cm

## 2022-12-19 NOTE — H&P (View-Only) (Signed)
 Patient ID: Omar Haley MRN: [id number] DOB/AGE: 02/24/1931 89 y.o.  Primary Care Physician:Holwerda, Scott, MD Primary Cardiologist: Renia Mikelson, MD   FOCUSED CARDIOVASCULAR PROBLEM LIST:   1.  Prostate cancer 2.  Diet-controlled diabetes 3.  Hypertension 4.  Hypothyroidism 5.  Hyperlipidemia 6.  Aortic atherosclerosis on CT scan 2021 7.  Severe aortic stenosis with AVA 0.95cm2, mean gradient of 37mmHg, and Vmax of 3.9m/s with EF 35-40%; EKG SR with LBBB   HISTORY OF PRESENT ILLNESS:  March 2024 consultation:  The patient is a 89 y.o. male with the indicated medical history here for recommendations regarding chest pain, presyncope, and incidentally noted systolic murmur.  The patient was seen by his primary care provider recently.  He noted that when he was mowing his lawn he developed some chest discomfort.  He also noted apparently had a presyncopal episode when walking to the mailbox.  For this reason he was referred for cardiology evaluation.   The patient tells me that over the last few months he has noticed increasing dyspnea and occasional right-sided chest discomfort when he does more than moderate exertion such as mowing his lawn.  Also going up stairs sometimes causes.  He has noted some lightheadedness but no frank syncope as well.  None of this happens at rest.  He has had some mild dependent edema which is better after night sleep.  He denies any significant bleeding or bruising.  He has never had a stroke.  He does not smoke.   He sees a dentist on a regular basis.  Plan: Obtain TTE.  Today:  The patient returns today to discuss the findings of his echocardiogram with confirmed severe aortic stenosis with moderate LV dysfunction.  Past Medical History:  Diagnosis Date   B12 deficiency    Cancer (HCC)    melanoma   Diabetes mellitus without complication (HCC)    DJD (degenerative joint disease)    Dupuytren's contracture    Glaucoma    Hemorrhoids     History of shingles    Hyperlipidemia    Hypertension    Neuropathy    Prostatitis    Unifocal PVCs    Vitamin D deficiency     Past Surgical History:  Procedure Laterality Date   COLON SURGERY  1991   rt hemicolectomy   HERNIA REPAIR     MELANOMA EXCISION  11/2005&04/2011   skin    Family History  Problem Relation Age of Onset   Heart failure Mother    Cerebral aneurysm Father     Social History   Socioeconomic History   Marital status: Married    Spouse name: Not on file   Number of children: Not on file   Years of education: Not on file   Highest education level: Not on file  Occupational History   Not on file  Tobacco Use   Smoking status: Never   Smokeless tobacco: Not on file  Substance and Sexual Activity   Alcohol use: No   Drug use: No   Sexual activity: Not on file  Other Topics Concern   Not on file  Social History Narrative   Not on file   Social Determinants of Health   Financial Resource Strain: Not on file  Food Insecurity: Not on file  Transportation Needs: Not on file  Physical Activity: Not on file  Stress: Not on file  Social Connections: Not on file  Intimate Partner Violence: Not on file     Prior to Admission   medications   Medication Sig Start Date End Date Taking? Authorizing Provider  Acetaminophen (TYLENOL ARTHRITIS PAIN PO) Take by mouth.    [provider]  amLODipine (NORVASC) 2.5 MG tablet TK 1 T PO D Patient not taking: Reported on 06/30/2021 04/02/16   [provider]  Aspirin 81 MG EC tablet Take 81 mg by mouth daily. Patient not taking: Reported on 04/13/2021    [provider]  bimatoprost (LUMIGAN) 0.01 % SOLN PLACE 1 DROP INTO BOTH EYES AT BEDTIME Patient not taking: Reported on 06/30/2021 03/07/15   [provider]  calcium carbonate (OS-CAL - DOSED IN MG OF ELEMENTAL CALCIUM) 1250 (500 Ca) MG tablet Take 1 tablet by mouth.    [provider]  Cholecalciferol (D-3-5) 125 MCG  (5000 UT) capsule Take 5,000 Units by mouth daily.    [provider]  COVID-19 mRNA Vac-TriS, Pfizer, SUSP injection Inject into the muscle. Patient not taking: Reported on 04/13/2021 12/31/20   Snider, Cynthia, MD  Cyanocobalamin (VITAMIN B 12 PO) Take 5,000 mcg by mouth daily.    [provider]  ergocalciferol (VITAMIN D2) 50000 UNITS capsule Take 50,000 Units by mouth once a week. Patient not taking: Reported on 04/13/2021    [provider]  hydrocortisone (ANUSOL-HC) 25 MG suppository Place 1 suppository (25 mg total) rectally at bedtime. Patient not taking: Reported on 04/13/2021 07/19/13   Thomas, Alicia, MD  ibuprofen (ADVIL,MOTRIN) 200 MG tablet Take 200 mg by mouth every 4 (four) hours as needed for pain. Patient not taking: Reported on 06/30/2021    [provider]  latanoprost (XALATAN) 0.005 % ophthalmic solution Place 1 drop into both eyes at bedtime.    [provider]  levothyroxine (SYNTHROID) 50 MCG tablet TK 1 T PO D 04/05/18   [provider]  nitroGLYCERIN (NITROSTAT) 0.4 MG SL tablet Place 0.4 mg under the tongue every 5 (five) minutes as needed for chest pain. Patient not taking: Reported on 04/13/2021    [provider]  olmesartan-hydrochlorothiazide (BENICAR HCT) 40-25 MG tablet Take 1 tablet by mouth daily.    [provider]  simvastatin (ZOCOR) 20 MG tablet Take 20 mg by mouth every evening. Patient not taking: Reported on 04/13/2021    [provider]  Travoprost, BAK Free, (TRAVATAN) 0.004 % SOLN ophthalmic solution 1 drop at bedtime. Patient not taking: Reported on 06/30/2021    [provider]    Allergies  Allergen Reactions   Enalapril     cough   Gemfibrozil    Micardis [Telmisartan]     Diarrhea    Zetia [Ezetimibe]     laryngitis    REVIEW OF SYSTEMS:  General: no fevers/chills/night sweats Eyes: no blurry vision, diplopia, or amaurosis ENT: no sore throat or  hearing loss Resp: no cough, wheezing, or hemoptysis CV: no edema or palpitations GI: no abdominal pain, nausea, vomiting, diarrhea, or constipation GU: no dysuria, frequency, or hematuria Skin: no rash Neuro: no headache, numbness, tingling, or weakness of extremities Musculoskeletal: no joint pain or swelling Heme: no bleeding, DVT, or easy bruising Endo: no polydipsia or polyuria  There were no vitals taken for this visit.  PHYSICAL EXAM: GEN:  AO x 3 in no acute distress HEENT: normal Dentition: Normal*** Neck: JVP normal. +2***carotid upstrokes without bruits. No thyromegaly. Lungs: equal expansion, clear bilaterally CV: Apex is discrete and nondisplaced, RRR without murmur or gallop*** Abd: soft, non-tender, non-distended; no bruit; positive bowel sounds Ext: no edema, ecchymoses, or   cyanosis Vascular: 2+ femoral pulses, 2+ radial pulses       Skin: warm and dry without rash Neuro: CN II-XII grossly intact; motor and sensory grossly intact    DATA AND STUDIES:  EKG:  SR with LBBB  2D ECHO: April 2024  1. Left ventricular ejection fraction, by estimation, is 35 to 40%. Left  ventricular ejection fraction by 3D volume is 39 %. The left ventricle has  moderately decreased function. The left ventricle has no regional wall  motion abnormalities. There is  moderate left ventricular hypertrophy of the basal-septal segment. Left  ventricular diastolic parameters are consistent with Grade I diastolic  dysfunction (impaired relaxation). There is hypokinesis of the left  ventricular, basal-mid inferolateral wall.  The average left ventricular global longitudinal strain is -9.8 %. The  global longitudinal strain is abnormal.   2. Right ventricular systolic function is normal. The right ventricular  size is normal.   3. The mitral valve is degenerative. Moderate mitral valve regurgitation.  No evidence of mitral stenosis. Moderate mitral annular calcification.   4. The aortic  valve is calcified. There is moderate calcification of the  aortic valve. There is moderate thickening of the aortic valve. Aortic  valve regurgitation is moderate. Severe aortic valve stenosis. Aortic  regurgitation PHT measures 405 msec.  Aortic valve area, by VTI measures 0.95 cm. Aortic valve mean gradient  measures 37.0 mmHg. Aortic valve Vmax measures 3.94 m/s. DVI 0.21.   5. The inferior vena cava is normal in size with greater than 50%  respiratory variability, suggesting right atrial pressure of 3 mmHg.   6. Consider TEE for further assessment of AV and MR.   CARDIAC CATH: ***  STS RISK CALCULATOR: ***  NHYA CLASS: ***    ASSESSMENT AND PLAN:   Nonrheumatic aortic valve stenosis  Nonrheumatic mitral valve regurgitation  Type 2 diabetes mellitus without complication, without long-term current use of insulin  Hypertension associated with diabetes  Hyperlipidemia associated with type 2 diabetes mellitus   I have personally reviewed the patients imaging data as summarized above.  I have reviewed the natural history of aortic stenosis with the patient and family members who are present today. We have discussed the limitations of medical therapy and the poor prognosis associated with symptomatic aortic stenosis. We have also reviewed potential treatment options, including palliative medical therapy, conventional surgical aortic valve replacement, and transcatheter aortic valve replacement. We discussed treatment options in the context of this patient's specific comorbid medical conditions.   All of the patient's questions were answered today. Will make further recommendations based on the results of studies outlined above.   Total time spent with patient today *** minutes. This includes reviewing records, evaluating the patient and coordinating care.   Hoy Fallert K Belicia Difatta, MD  12/19/2022 2:56 PM    [facility] [address], [place], [place]   [address] Phone: [phone]; Fax: [phone]     

## 2022-12-19 NOTE — Progress Notes (Signed)
Patient ID: Omar Haley MRN: [id number] DOB/AGE: 1931-06-13 89 y.o.  Primary Care Physician:Alysia Penna, MD Primary Cardiologist: Alverda Skeans, MD   FOCUSED CARDIOVASCULAR PROBLEM LIST:   1.  Prostate cancer 2.  Diet-controlled diabetes 3.  Hypertension 4.  Hypothyroidism 5.  Hyperlipidemia 6.  Aortic atherosclerosis on CT scan 2021 7.  Severe aortic stenosis with AVA 0.95cm2, mean gradient of , and Vmax of 3.71m/s with EF 35-40%; EKG SR with LBBB   HISTORY OF PRESENT ILLNESS:  March 2024 consultation:  The patient is a 89 y.o. male with the indicated medical history here for recommendations regarding chest pain, presyncope, and incidentally noted systolic murmur.  The patient was seen by his primary care provider recently.  He noted that when he was mowing his lawn he developed some chest discomfort.  He also noted apparently had a presyncopal episode when walking to the mailbox.  For this reason he was referred for cardiology evaluation.   The patient tells me that over the last few months he has noticed increasing dyspnea and occasional right-sided chest discomfort when he does more than moderate exertion such as mowing his lawn.  Also going up stairs sometimes causes.  He has noted some lightheadedness but no frank syncope as well.  None of this happens at rest.  He has had some mild dependent edema which is better after night sleep.  He denies any significant bleeding or bruising.  He has never had a stroke.  He does not smoke.   He sees a Education officer, community on a regular basis.  Plan: Obtain TTE.  Today:  The patient returns today to discuss the findings of his echocardiogram with confirmed severe aortic stenosis with moderate LV dysfunction.  Past Medical History:  Diagnosis Date   B12 deficiency    Cancer (HCC)    melanoma   Diabetes mellitus without complication (HCC)    DJD (degenerative joint disease)    Dupuytren's contracture    Glaucoma    Hemorrhoids     History of shingles    Hyperlipidemia    Hypertension    Neuropathy    Prostatitis    Unifocal PVCs    Vitamin D deficiency     Past Surgical History:  Procedure Laterality Date   COLON SURGERY  1991   rt hemicolectomy   HERNIA REPAIR     MELANOMA EXCISION  11/2005&04/2011   skin    Family History  Problem Relation Age of Onset   Heart failure Mother    Cerebral aneurysm Father     Social History   Socioeconomic History   Marital status: Married    Spouse name: Not on file   Number of children: Not on file   Years of education: Not on file   Highest education level: Not on file  Occupational History   Not on file  Tobacco Use   Smoking status: Never   Smokeless tobacco: Not on file  Substance and Sexual Activity   Alcohol use: No   Drug use: No   Sexual activity: Not on file  Other Topics Concern   Not on file  Social History Narrative   Not on file   Social Determinants of Health   Financial Resource Strain: Not on file  Food Insecurity: Not on file  Transportation Needs: Not on file  Physical Activity: Not on file  Stress: Not on file  Social Connections: Not on file  Intimate Partner Violence: Not on file     Prior to Admission  medications   Medication Sig Start Date End Date Taking? Authorizing Provider  Acetaminophen (TYLENOL ARTHRITIS PAIN PO) Take by mouth.    [provider]  amLODipine (NORVASC) 2.5 MG tablet TK 1 T PO D Patient not taking: Reported on 06/30/2021 04/02/16   [provider]  Aspirin 81 MG EC tablet Take 81 mg by mouth daily. Patient not taking: Reported on 04/13/2021    [provider]  bimatoprost (LUMIGAN) 0.01 % SOLN PLACE 1 DROP INTO BOTH EYES AT BEDTIME Patient not taking: Reported on 06/30/2021 03/07/15   [provider]  calcium carbonate (OS-CAL - DOSED IN MG OF ELEMENTAL CALCIUM) 1250 (500 Ca) MG tablet Take 1 tablet by mouth.    [provider]  Cholecalciferol (D-3-5) 125 MCG  (5000 UT) capsule Take 5,000 Units by mouth daily.    [provider]  COVID-19 mRNA Vac-TriS, Pfizer, SUSP injection Inject into the muscle. Patient not taking: Reported on 04/13/2021 12/31/20   Judyann Munson, MD  Cyanocobalamin (VITAMIN B 12 PO) Take 5,000 mcg by mouth daily.    [provider]  ergocalciferol (VITAMIN D2) 50000 UNITS capsule Take 50,000 Units by mouth once a week. Patient not taking: Reported on 04/13/2021    [provider]  hydrocortisone (ANUSOL-HC) 25 MG suppository Place 1 suppository (25 mg total) rectally at bedtime. Patient not taking: Reported on 04/13/2021 07/19/13   Romie Levee, MD  ibuprofen (ADVIL,MOTRIN) 200 MG tablet Take 200 mg by mouth every 4 (four) hours as needed for pain. Patient not taking: Reported on 06/30/2021    [provider]  latanoprost (XALATAN) 0.005 % ophthalmic solution Place 1 drop into both eyes at bedtime.    [provider]  levothyroxine (SYNTHROID) 50 MCG tablet TK 1 T PO D 04/05/18   [provider]  nitroGLYCERIN (NITROSTAT) 0.4 MG SL tablet Place 0.4 mg under the tongue every 5 (five) minutes as needed for chest pain. Patient not taking: Reported on 04/13/2021    [provider]  olmesartan-hydrochlorothiazide (BENICAR HCT) 40-25 MG tablet Take 1 tablet by mouth daily.    [provider]  simvastatin (ZOCOR) 20 MG tablet Take 20 mg by mouth every evening. Patient not taking: Reported on 04/13/2021    [provider]  Travoprost, BAK Free, (TRAVATAN) 0.004 % SOLN ophthalmic solution 1 drop at bedtime. Patient not taking: Reported on 06/30/2021    [provider]    Allergies  Allergen Reactions   Enalapril     cough   Gemfibrozil    Micardis [Telmisartan]     Diarrhea    Zetia [Ezetimibe]     laryngitis    REVIEW OF SYSTEMS:  General: no fevers/chills/night sweats Eyes: no blurry vision, diplopia, or amaurosis ENT: no sore throat or  hearing loss Resp: no cough, wheezing, or hemoptysis CV: no edema or palpitations GI: no abdominal pain, nausea, vomiting, diarrhea, or constipation GU: no dysuria, frequency, or hematuria Skin: no rash Neuro: no headache, numbness, tingling, or weakness of extremities Musculoskeletal: no joint pain or swelling Heme: no bleeding, DVT, or easy bruising Endo: no polydipsia or polyuria  There were no vitals taken for this visit.  PHYSICAL EXAM: GEN:  AO x 3 in no acute distress HEENT: normal Dentition: Normal*** Neck: JVP normal. +2***carotid upstrokes without bruits. No thyromegaly. Lungs: equal expansion, clear bilaterally CV: Apex is discrete and nondisplaced, RRR without murmur or gallop*** Abd: soft, non-tender, non-distended; no bruit; positive bowel sounds Ext: no edema, ecchymoses, or  cyanosis Vascular: 2+ femoral pulses, 2+ radial pulses       Skin: warm and dry without rash Neuro: CN II-XII grossly intact; motor and sensory grossly intact    DATA AND STUDIES:  EKG:  SR with LBBB  2D ECHO: April 2024  1. Left ventricular ejection fraction, by estimation, is 35 to 40%. Left  ventricular ejection fraction by 3D volume is 39 %. The left ventricle has  moderately decreased function. The left ventricle has no regional wall  motion abnormalities. There is  moderate left ventricular hypertrophy of the basal-septal segment. Left  ventricular diastolic parameters are consistent with Grade I diastolic  dysfunction (impaired relaxation). There is hypokinesis of the left  ventricular, basal-mid inferolateral wall.  The average left ventricular global longitudinal strain is -9.8 %. The  global longitudinal strain is abnormal.   2. Right ventricular systolic function is normal. The right ventricular  size is normal.   3. The mitral valve is degenerative. Moderate mitral valve regurgitation.  No evidence of mitral stenosis. Moderate mitral annular calcification.   4. The aortic  valve is calcified. There is moderate calcification of the  aortic valve. There is moderate thickening of the aortic valve. Aortic  valve regurgitation is moderate. Severe aortic valve stenosis. Aortic  regurgitation PHT measures 405 msec.  Aortic valve area, by VTI measures 0.95 cm. Aortic valve mean gradient  measures 37.0 mmHg. Aortic valve Vmax measures 3.94 m/s. DVI 0.21.   5. The inferior vena cava is normal in size with greater than 50%  respiratory variability, suggesting right atrial pressure of 3 mmHg.   6. Consider TEE for further assessment of AV and MR.   CARDIAC CATH: ***  STS RISK CALCULATOR: ***  NHYA CLASS: ***    ASSESSMENT AND PLAN:   Nonrheumatic aortic valve stenosis  Nonrheumatic mitral valve regurgitation  Type 2 diabetes mellitus without complication, without long-term current use of insulin  Hypertension associated with diabetes  Hyperlipidemia associated with type 2 diabetes mellitus   I have personally reviewed the patients imaging data as summarized above.  I have reviewed the natural history of aortic stenosis with the patient and family members who are present today. We have discussed the limitations of medical therapy and the poor prognosis associated with symptomatic aortic stenosis. We have also reviewed potential treatment options, including palliative medical therapy, conventional surgical aortic valve replacement, and transcatheter aortic valve replacement. We discussed treatment options in the context of this patient's specific comorbid medical conditions.   All of the patient's questions were answered today. Will make further recommendations based on the results of studies outlined above.   Total time spent with patient today *** minutes. This includes reviewing records, evaluating the patient and coordinating care.   [name], MD  12/19/2022 2:56 PM    [facility] [address] [place], [place], [place]   [address] Phone: [phone]; Fax: [phone]

## 2022-12-20 ENCOUNTER — Encounter: Payer: Self-pay | Admitting: Internal Medicine

## 2022-12-20 ENCOUNTER — Other Ambulatory Visit: Payer: Self-pay

## 2022-12-20 ENCOUNTER — Ambulatory Visit: Payer: Medicare Other | Attending: Internal Medicine | Admitting: Internal Medicine

## 2022-12-20 VITALS — BP 130/82 | HR 84 | Ht 70.0 in | Wt 159.4 lb

## 2022-12-20 DIAGNOSIS — E1159 Type 2 diabetes mellitus with other circulatory complications: Secondary | ICD-10-CM | POA: Diagnosis not present

## 2022-12-20 DIAGNOSIS — E1169 Type 2 diabetes mellitus with other specified complication: Secondary | ICD-10-CM

## 2022-12-20 DIAGNOSIS — I350 Nonrheumatic aortic (valve) stenosis: Secondary | ICD-10-CM | POA: Diagnosis not present

## 2022-12-20 DIAGNOSIS — E119 Type 2 diabetes mellitus without complications: Secondary | ICD-10-CM

## 2022-12-20 DIAGNOSIS — I152 Hypertension secondary to endocrine disorders: Secondary | ICD-10-CM | POA: Diagnosis not present

## 2022-12-20 DIAGNOSIS — E785 Hyperlipidemia, unspecified: Secondary | ICD-10-CM | POA: Insufficient documentation

## 2022-12-20 DIAGNOSIS — I340 Nonrheumatic mitral (valve) insufficiency: Secondary | ICD-10-CM | POA: Diagnosis not present

## 2022-12-20 MED ORDER — METOPROLOL TARTRATE 50 MG PO TABS: ORAL_TABLET | ORAL | 0 refills | Status: DC

## 2022-12-20 NOTE — Patient Instructions (Signed)
Medication Instructions:  Your physician recommends that you continue on your current medications as directed. Please refer to the Current Medication list given to you today.  *If you need a refill on your cardiac medications before your next appointment, please call your pharmacy*   Lab Work: CBC and Bmet  on 12/21/2022 If you have labs (blood work) drawn today and your tests are completely normal, you will receive your results only by: MyChart Message (if you have MyChart) OR A paper copy in the mail If you have any lab test that is abnormal or we need to change your treatment, we will call you to review the results.   Testing/Procedures: Your physician has requested that you have cardiac CT. Cardiac computed tomography (CT) is a painless test that uses an x-ray machine to take clear, detailed pictures of your heart. For further information please visit [email or web address] Please follow instruction sheet as given.     Follow-Up: At [facility], you and your health needs are our priority.  As part of our continuing mission to provide you with exceptional heart care, we have created designated Provider Care Teams.  These Care Teams include your primary Cardiologist (physician) and Advanced Practice Providers (APPs -  Physician Assistants and Nurse Practitioners) who all work together to provide you with the care you need, when you need it.    Your next appointment:   As scheduled by structural team  Other Instructions  [facility] A DEPT OF [place] AT [facility] [address] [place], [place] [address] [id number] [facility] [place] [address] Dept: [phone] Loc: [phone]  Rennie Galinato  12/20/2022  You are scheduled for a Cardiac Catheterization on Thursday, April 25 with Dr. Alverda Skeans.  1. Please arrive at the [facility] (Main Entrance A) at [facility]: [address] [place], [place] [address] at 5:30  AM (This time is two hours before your procedure to ensure your preparation). Free valet parking service is available.   Special note: Every effort is made to have your procedure done on time. Please understand that emergencies sometimes delay scheduled procedures.  2. Diet: Do not eat solid foods after midnight.  The patient may have clear liquids until 5am upon the day of the procedure.  3. Labs: You will need to have blood drawn on Tuesday  Tuesday, April 9 at [facility] at [facility]. [address], [place]  Open: 7:30am - 5pm    Phone: [phone]. You do not need to be fasting.  4. Medication instructions in preparation for your procedure:   Stop taking, Cozaar (Losartan), Benicar (Olmesartan) Wednesday, April 24,   On the morning of your procedure, take your Aspirin 81 mg and any morning medicines NOT listed above.  You may use sips of water.  5. Plan to go home the same day, you will only stay overnight if medically necessary. 6. Bring a current list of your medications and current insurance cards. 7. You MUST have a responsible person to drive you home. 8. Someone MUST be with you the first 24 hours after you arrive home or your discharge will be delayed. 9. Please wear clothes that are easy to get on and off and wear slip-on shoes.  Thank you for allowing [place] to care for you!   -- [facility]

## 2022-12-20 NOTE — Progress Notes (Addendum)
Pre Surgical Assessment: 5 M Walk Test  93M=16.90ft  5 Meter Walk Test- trial 1: 07.96 seconds 5 Meter Walk Test- trial 2: 06.28 seconds 5 Meter Walk Test- trial 3: 07.08 seconds 5 Meter Walk Test Average: 07.10 seconds    STS Risk calculator:  Procedure Type: Isolated AVR PERIOPERATIVE OUTCOME ESTIMATE % Operative Mortality 4.31% Morbidity & Mortality 9.77% Stroke 1.9% Renal Failure 0.909% Reoperation 4.18% Prolonged Ventilation 4.78% Deep Sternal Wound Infection 0.028% Long Hospital Stay (>14 days) 6.89% Short Hospital Stay (<6 days)* 33.2%

## 2022-12-21 ENCOUNTER — Ambulatory Visit: Payer: Medicare Other | Attending: Cardiology

## 2022-12-21 DIAGNOSIS — I340 Nonrheumatic mitral (valve) insufficiency: Secondary | ICD-10-CM | POA: Diagnosis not present

## 2022-12-21 DIAGNOSIS — I350 Nonrheumatic aortic (valve) stenosis: Secondary | ICD-10-CM | POA: Diagnosis not present

## 2022-12-22 LAB — CBC
Hematocrit: 38.5 % (ref 37.5–51.0)
Hemoglobin: 12.6 g/dL — ABNORMAL LOW (ref 13.0–17.7)
MCH: 28.8 pg (ref 26.6–33.0)
MCHC: 32.7 g/dL (ref 31.5–35.7)
MCV: 88 fL (ref 79–97)
Platelets: 161 10*3/uL (ref 150–450)
RBC: 4.38 x10E6/uL (ref 4.14–5.80)
RDW: 13.7 % (ref 11.6–15.4)
WBC: 4.6 10*3/uL (ref 3.4–10.8)

## 2022-12-22 LAB — BASIC METABOLIC PANEL
BUN/Creatinine Ratio: 15 (ref 10–24)
BUN: 13 mg/dL (ref 8–27)
CO2: 24 mmol/L (ref 20–29)
Calcium: 9.5 mg/dL (ref 8.6–10.2)
Chloride: 99 mmol/L (ref 96–106)
Creatinine, Ser: 0.86 mg/dL (ref 0.76–1.27)
Glucose: 145 mg/dL — ABNORMAL HIGH (ref 70–99)
Potassium: 4.3 mmol/L (ref 3.5–5.2)
Sodium: 138 mmol/L (ref 134–144)
eGFR: 83 mL/min/{1.73_m2} (ref >59)

## 2022-12-27 ENCOUNTER — Ambulatory Visit (HOSPITAL_COMMUNITY)
Admission: RE | Admit: 2022-12-27 | Discharge: 2022-12-27 | Disposition: A | Discharge status: Home / Self Care | Payer: Medicare Other | Source: Ambulatory Visit | Attending: Internal Medicine | Admitting: Internal Medicine

## 2022-12-27 DIAGNOSIS — I350 Nonrheumatic aortic (valve) stenosis: Secondary | ICD-10-CM | POA: Diagnosis not present

## 2022-12-27 DIAGNOSIS — Z7689 Persons encountering health services in other specified circumstances: Secondary | ICD-10-CM | POA: Diagnosis not present

## 2022-12-27 MED ORDER — IOHEXOL 350 MG/ML SOLN
95.0000 mL | Freq: Once | INTRAVENOUS | Status: AC | PRN
  Administered 2022-12-27: 95 mL via INTRAVENOUS

## 2022-12-27 MED ORDER — NITROGLYCERIN 0.4 MG SL SUBL
SUBLINGUAL_TABLET | SUBLINGUAL | Status: AC
  Filled 2022-12-27: qty 2

## 2022-12-27 MED ORDER — NITROGLYCERIN 0.4 MG SL SUBL: 0.8000 mg | SUBLINGUAL_TABLET | Freq: Once | SUBLINGUAL | Status: DC

## 2023-01-05 ENCOUNTER — Telehealth: Payer: Self-pay | Admitting: *Deleted

## 2023-01-05 NOTE — Telephone Encounter (Signed)
Cardiac Catheterization scheduled at [facility] for: Thursday January 06, 2023 7:30 AM Arrival time [facility] Main Entrance A at: 5:30 AM  Nothing to eat after midnight prior to procedure, clear liquids until 5 AM day of procedure.  Medication instructions: -Usual morning medications can be taken with sips of water including aspirin 81 mg.  Confirmed patient has responsible adult to drive home post procedure and be with patient first 24 hours after arriving home.  Plan to go home the same day, you will only stay overnight if medically necessary.  Reviewed procedure instructions with patient.

## 2023-01-06 ENCOUNTER — Ambulatory Visit (HOSPITAL_COMMUNITY)
Admission: RE | Admit: 2023-01-06 | Discharge: 2023-01-06 | Disposition: A | Discharge status: Home / Self Care | Payer: Medicare Other | Attending: Internal Medicine | Admitting: Internal Medicine

## 2023-01-06 ENCOUNTER — Encounter (HOSPITAL_COMMUNITY)
Admission: RE | Disposition: A | Discharge status: Home / Self Care | Payer: Self-pay | Source: Home / Self Care | Attending: Internal Medicine

## 2023-01-06 ENCOUNTER — Encounter (HOSPITAL_COMMUNITY): Payer: Self-pay | Admitting: Internal Medicine

## 2023-01-06 DIAGNOSIS — E785 Hyperlipidemia, unspecified: Secondary | ICD-10-CM | POA: Insufficient documentation

## 2023-01-06 DIAGNOSIS — I340 Nonrheumatic mitral (valve) insufficiency: Secondary | ICD-10-CM | POA: Insufficient documentation

## 2023-01-06 DIAGNOSIS — I10 Essential (primary) hypertension: Secondary | ICD-10-CM | POA: Insufficient documentation

## 2023-01-06 DIAGNOSIS — I350 Nonrheumatic aortic (valve) stenosis: Secondary | ICD-10-CM | POA: Diagnosis not present

## 2023-01-06 DIAGNOSIS — E119 Type 2 diabetes mellitus without complications: Secondary | ICD-10-CM | POA: Diagnosis not present

## 2023-01-06 DIAGNOSIS — I429 Cardiomyopathy, unspecified: Secondary | ICD-10-CM | POA: Insufficient documentation

## 2023-01-06 DIAGNOSIS — C61 Malignant neoplasm of prostate: Secondary | ICD-10-CM | POA: Diagnosis not present

## 2023-01-06 DIAGNOSIS — E039 Hypothyroidism, unspecified: Secondary | ICD-10-CM | POA: Insufficient documentation

## 2023-01-06 DIAGNOSIS — I2510 Atherosclerotic heart disease of native coronary artery without angina pectoris: Secondary | ICD-10-CM | POA: Diagnosis not present

## 2023-01-06 HISTORY — PX: RIGHT/LEFT HEART CATH AND CORONARY ANGIOGRAPHY: CATH118266

## 2023-01-06 LAB — POCT I-STAT 7, (LYTES, BLD GAS, ICA,H+H)
Acid-base deficit: 2 mmol/L (ref 0.0–2.0)
Bicarbonate: 22.2 mmol/L (ref 20.0–28.0)
Calcium, Ion: 1.15 mmol/L (ref 1.15–1.40)
HCT: 36 % — ABNORMAL LOW (ref 39.0–52.0)
Hemoglobin: 12.2 g/dL — ABNORMAL LOW (ref 13.0–17.0)
O2 Saturation: 86 %
Potassium: 3.5 mmol/L (ref 3.5–5.1)
Sodium: 139 mmol/L (ref 135–145)
TCO2: 23 mmol/L (ref 22–32)
pCO2 arterial: 35.7 mmHg (ref 32–48)
pH, Arterial: 7.401 (ref 7.35–7.45)
pO2, Arterial: 51 mmHg — ABNORMAL LOW (ref 83–108)

## 2023-01-06 LAB — POCT I-STAT EG7
Acid-base deficit: 1 mmol/L (ref 0.0–2.0)
Acid-base deficit: 1 mmol/L (ref 0.0–2.0)
Bicarbonate: 24.1 mmol/L (ref 20.0–28.0)
Bicarbonate: 24.5 mmol/L (ref 20.0–28.0)
Calcium, Ion: 1.19 mmol/L (ref 1.15–1.40)
Calcium, Ion: 1.22 mmol/L (ref 1.15–1.40)
HCT: 37 % — ABNORMAL LOW (ref 39.0–52.0)
HCT: 37 % — ABNORMAL LOW (ref 39.0–52.0)
Hemoglobin: 12.6 g/dL — ABNORMAL LOW (ref 13.0–17.0)
Hemoglobin: 12.6 g/dL — ABNORMAL LOW (ref 13.0–17.0)
O2 Saturation: 67 %
O2 Saturation: 68 %
Potassium: 3.6 mmol/L (ref 3.5–5.1)
Potassium: 3.7 mmol/L (ref 3.5–5.1)
Sodium: 138 mmol/L (ref 135–145)
Sodium: 137 mmol/L (ref 135–145)
TCO2: 25 mmol/L (ref 22–32)
TCO2: 26 mmol/L (ref 22–32)
pCO2, Ven: 39.8 mmHg — ABNORMAL LOW (ref 44–60)
pCO2, Ven: 42.8 mmHg — ABNORMAL LOW (ref 44–60)
pH, Ven: 7.39 (ref 7.25–7.43)
pH, Ven: 7.366 (ref 7.25–7.43)
pO2, Ven: 35 mmHg (ref 32–45)
pO2, Ven: 37 mmHg (ref 32–45)

## 2023-01-06 SURGERY — RIGHT/LEFT HEART CATH AND CORONARY ANGIOGRAPHY
Anesthesia: LOCAL

## 2023-01-06 MED ORDER — VERAPAMIL HCL 2.5 MG/ML IV SOLN
INTRAVENOUS | Status: AC
  Filled 2023-01-06: qty 2

## 2023-01-06 MED ORDER — LIDOCAINE HCL (PF) 1 % IJ SOLN
INTRAMUSCULAR | Status: AC
  Filled 2023-01-06: qty 30

## 2023-01-06 MED ORDER — ACETAMINOPHEN 325 MG PO TABS: 650.0000 mg | ORAL_TABLET | ORAL | Status: DC | PRN

## 2023-01-06 MED ORDER — SODIUM CHLORIDE 0.9% FLUSH: 3.0000 mL | INTRAVENOUS | Status: DC | PRN

## 2023-01-06 MED ORDER — ONDANSETRON HCL 4 MG/2ML IJ SOLN: 4.0000 mg | Freq: Four times a day (QID) | INTRAMUSCULAR | Status: DC | PRN

## 2023-01-06 MED ORDER — SODIUM CHLORIDE 0.9 % WEIGHT BASED INFUSION: 1.0000 mL/kg/h | INTRAVENOUS | Status: DC

## 2023-01-06 MED ORDER — FENTANYL CITRATE (PF) 100 MCG/2ML IJ SOLN
INTRAMUSCULAR | Status: AC
  Filled 2023-01-06: qty 2

## 2023-01-06 MED ORDER — SODIUM CHLORIDE 0.9 % WEIGHT BASED INFUSION
3.0000 mL/kg/h | INTRAVENOUS | Status: AC
  Administered 2023-01-06: 3 mL/kg/h via INTRAVENOUS

## 2023-01-06 MED ORDER — FENTANYL CITRATE (PF) 100 MCG/2ML IJ SOLN
INTRAMUSCULAR | Status: DC | PRN
  Administered 2023-01-06: 25 ug via INTRAVENOUS

## 2023-01-06 MED ORDER — HEPARIN (PORCINE) IN NACL 1000-0.9 UT/500ML-% IV SOLN
INTRAVENOUS | Status: DC | PRN
  Administered 2023-01-06 (×2): 500 mL

## 2023-01-06 MED ORDER — MIDAZOLAM HCL 2 MG/2ML IJ SOLN
INTRAMUSCULAR | Status: AC
  Filled 2023-01-06: qty 2

## 2023-01-06 MED ORDER — LIDOCAINE HCL (PF) 1 % IJ SOLN
INTRAMUSCULAR | Status: DC | PRN
  Administered 2023-01-06 (×2): 2 mL

## 2023-01-06 MED ORDER — SODIUM CHLORIDE 0.9 % IV SOLN: 250.0000 mL | INTRAVENOUS | Status: DC | PRN

## 2023-01-06 MED ORDER — HEPARIN SODIUM (PORCINE) 1000 UNIT/ML IJ SOLN
INTRAMUSCULAR | Status: DC | PRN
  Administered 2023-01-06: 5000 [IU] via INTRAVENOUS

## 2023-01-06 MED ORDER — SODIUM CHLORIDE 0.9% FLUSH: 3.0000 mL | Freq: Two times a day (BID) | INTRAVENOUS | Status: DC

## 2023-01-06 MED ORDER — SODIUM CHLORIDE 0.9 % IV SOLN: INTRAVENOUS | Status: DC

## 2023-01-06 MED ORDER — IOHEXOL 350 MG/ML SOLN
INTRAVENOUS | Status: DC | PRN
  Administered 2023-01-06: 55 mL

## 2023-01-06 MED ORDER — VERAPAMIL HCL 2.5 MG/ML IV SOLN
INTRAVENOUS | Status: DC | PRN
  Administered 2023-01-06: 10 mL via INTRA_ARTERIAL

## 2023-01-06 MED ORDER — ASPIRIN 81 MG PO CHEW: 81.0000 mg | CHEWABLE_TABLET | ORAL | Status: AC

## 2023-01-06 MED ORDER — HYDRALAZINE HCL 20 MG/ML IJ SOLN: 10.0000 mg | INTRAMUSCULAR | Status: DC | PRN

## 2023-01-06 MED ORDER — LABETALOL HCL 5 MG/ML IV SOLN: 10.0000 mg | INTRAVENOUS | Status: DC | PRN

## 2023-01-06 MED ORDER — HEPARIN SODIUM (PORCINE) 1000 UNIT/ML IJ SOLN
INTRAMUSCULAR | Status: AC
  Filled 2023-01-06: qty 10

## 2023-01-06 MED ORDER — MIDAZOLAM HCL 2 MG/2ML IJ SOLN
INTRAMUSCULAR | Status: DC | PRN
  Administered 2023-01-06: 1 mg via INTRAVENOUS

## 2023-01-06 SURGICAL SUPPLY — 15 items
CATH BALLN WEDGE 5F 110CM (CATHETERS) IMPLANT
CATH DIAG 6FR JR4 (CATHETERS) IMPLANT
CATH INFINITI 6F FL3.5 (CATHETERS) IMPLANT
CATH OPTITORQUE TIG 4.0 6F (CATHETERS) IMPLANT
DEVICE RAD COMP TR BAND LRG (VASCULAR PRODUCTS) IMPLANT
GLIDESHEATH SLEND SS 6F .021 (SHEATH) IMPLANT
GUIDEWIRE .025 260CM (WIRE) IMPLANT
GUIDEWIRE INQWIRE 1.5J.035X260 (WIRE) IMPLANT
INQWIRE 1.5J .035X260CM (WIRE) ×1
KIT HEART LEFT (KITS) ×2 IMPLANT
PACK CARDIAC CATHETERIZATION (CUSTOM PROCEDURE TRAY) ×2 IMPLANT
SHEATH GLIDE SLENDER 4/5FR (SHEATH) IMPLANT
TRANSDUCER W/STOPCOCK (MISCELLANEOUS) ×2 IMPLANT
TUBING CIL FLEX 10 FLL-RA (TUBING) ×2 IMPLANT
WIRE HI TORQ VERSACORE-J 145CM (WIRE) IMPLANT

## 2023-01-06 NOTE — Interval H&P Note (Signed)
History and Physical Interval Note:  01/06/2023 6:52 AM  Omar Haley  has presented today for surgery, with the diagnosis of tavr workup.  The various methods of treatment have been discussed with the patient and family. After consideration of risks, benefits and other options for treatment, the patient has consented to  Procedure(s): RIGHT/LEFT HEART CATH AND CORONARY ANGIOGRAPHY (N/A) as a surgical intervention.  The patient's history has been reviewed, patient examined, no change in status, stable for surgery.  I have reviewed the patient's chart and labs.  Questions were answered to the patient's satisfaction.     [name]

## 2023-01-10 ENCOUNTER — Encounter: Payer: Medicare Other | Admitting: Surgery

## 2023-01-12 ENCOUNTER — Other Ambulatory Visit: Payer: Self-pay

## 2023-01-12 ENCOUNTER — Encounter: Payer: Self-pay | Admitting: Surgery

## 2023-01-12 ENCOUNTER — Institutional Professional Consult (permissible substitution) (INDEPENDENT_AMBULATORY_CARE_PROVIDER_SITE_OTHER): Payer: Medicare Other | Admitting: Surgery

## 2023-01-12 VITALS — BP 175/66 | HR 67 | Resp 20 | Ht 70.0 in | Wt 160.0 lb

## 2023-01-12 DIAGNOSIS — I350 Nonrheumatic aortic (valve) stenosis: Secondary | ICD-10-CM

## 2023-01-12 NOTE — Progress Notes (Signed)
Patient ID: Omar Haley, male   DOB: August 09, 1931, 89 y.o.   MRN: [id number]  [facility]       [address]       [name] [address]             [phone]          CARDIOTHORACIC SURGERY CONSULTATION REPORT  PCP is Alysia Penna, MD Referring Provider is Alverda Skeans, MD Primary Cardiologist is Alverda Skeans, MD  Reason for consultation: Severe aortic stenosis  HPI:  The patient is an 89 year old gentleman with a history of hypertension, hyperlipidemia, diabetes, vitamin B12 deficiency, and recently diagnosed severe aortic stenosis who presents with a several month history of progressive exertional fatigue and shortness of breath as well as substernal chest pain radiating into the right shoulder.  The symptoms were worse when he was mowing his yard or walking up an incline or steps.  He had a presyncopal episode when walking to the mailbox which prompted cardiology referral.  Echocardiogram on 12/14/2022 showed a calcified aortic valve with moderate thickening and a mean gradient of 37 mmHg.  Peak gradient was 62 mmHg with a valve area by VTI of 0.95 cm.  There is moderate aortic insufficiency with a pressure half-time of 405 ms.  There was moderate mitral regurgitation.  Left ventricular ejection fraction was reduced to 35 to 40% with moderate LVH and grade 1 diastolic dysfunction.  The patient is here today with his wife.  He is a retired Estate manager/land agent.  He was previously very active in his yard and garden and would like to get back to that.  He and his wife noted that his symptoms have been occurring more frequently and with less exertion over the past couple months.  Past Medical History:  Diagnosis Date   B12 deficiency    Cancer (HCC)    melanoma   Diabetes mellitus without complication (HCC)    DJD (degenerative joint disease)    Dupuytren's contracture    Glaucoma    Hemorrhoids     History of shingles    Hyperlipidemia    Hypertension    Neuropathy    Prostatitis    Unifocal PVCs    Vitamin D deficiency     Past Surgical History:  Procedure Laterality Date   COLON SURGERY  1991   rt hemicolectomy   HERNIA REPAIR     MELANOMA EXCISION  11/2005&04/2011   skin   RIGHT/LEFT HEART CATH AND CORONARY ANGIOGRAPHY N/A 01/06/2023   Procedure: RIGHT/LEFT HEART CATH AND CORONARY ANGIOGRAPHY;  Surgeon: [name], MD;  Location: [facility];  Service: Cardiovascular;  Laterality: N/A;    Family History  Problem Relation Age of Onset   Heart failure Mother    Cerebral aneurysm Father     Social History   Socioeconomic History   Marital status: Married    Spouse name: Not on file   Number of children: Not on file   Years of education: Not on file   Highest education level: Not on file  Occupational History   Not on file  Tobacco Use   Smoking status: Never   Smokeless tobacco: Not on file  Substance and Sexual Activity   Alcohol use: No   Drug use: No   Sexual activity: Not on file  Other Topics Concern   Not on file  Social History Narrative   Not on file  Social Determinants of Health   Financial Resource Strain: Not on file  Food Insecurity: Not on file  Transportation Needs: Not on file  Physical Activity: Not on file  Stress: Not on file  Social Connections: Not on file  Intimate Partner Violence: Not on file    Prior to Admission medications   Medication Sig Start Date End Date Taking? Authorizing Provider  acetaminophen (TYLENOL) 650 MG CR tablet Take 650 mg by mouth every 8 (eight) hours as needed for pain.   Yes [provider]  Aspirin 81 MG EC tablet Take 81 mg by mouth daily.   Yes [provider]  ibuprofen (ADVIL,MOTRIN) 200 MG tablet Take 200 mg by mouth every 4 (four) hours as needed for pain, mild pain or moderate pain.   Yes [provider]  latanoprost (XALATAN) 0.005 % ophthalmic solution  Place 1 drop into both eyes at bedtime.   Yes [provider]  levothyroxine (SYNTHROID) 50 MCG tablet Take 50 mcg by mouth daily before breakfast. 04/05/18  Yes [provider]  losartan-hydrochlorothiazide (HYZAAR) 100-25 MG tablet Take 1 tablet by mouth daily.   Yes [provider]    Current Outpatient Medications  Medication Sig Dispense Refill   acetaminophen (TYLENOL) 650 MG CR tablet Take 650 mg by mouth every 8 (eight) hours as needed for pain.     Aspirin 81 MG EC tablet Take 81 mg by mouth daily.     ibuprofen (ADVIL,MOTRIN) 200 MG tablet Take 200 mg by mouth every 4 (four) hours as needed for pain, mild pain or moderate pain.     latanoprost (XALATAN) 0.005 % ophthalmic solution Place 1 drop into both eyes at bedtime.     levothyroxine (SYNTHROID) 50 MCG tablet Take 50 mcg by mouth daily before breakfast.     losartan-hydrochlorothiazide (HYZAAR) 100-25 MG tablet Take 1 tablet by mouth daily.     No current facility-administered medications for this visit.    Allergies  Allergen Reactions   Enalapril     cough   Gemfibrozil Other (See Comments)    Unknown   Micardis [Telmisartan]     Diarrhea    Zetia [Ezetimibe]     laryngitis      Review of Systems:   General:  normal appetite, + decreased energy, no weight gain, no weight loss, no fever  Cardiac:  + chest pain with exertion, no chest pain at rest, +SOB with mild exertion, no resting SOB, no PND, no orthopnea, no palpitations, no arrhythmia, no atrial fibrillation, + LE edema, + dizzy spells, no syncope  Respiratory:  + exertional shortness of breath, no home oxygen, no productive cough, no dry cough, no bronchitis, no wheezing, no hemoptysis, no asthma, no pain with inspiration or cough, no sleep apnea, no CPAP at night  GI:   no difficulty swallowing, no reflux, no frequent heartburn, no hiatal hernia, no abdominal pain, no constipation, no diarrhea, no hematochezia, no hematemesis, no  melena  GU:   no dysuria,  no frequency, no urinary tract infection, no hematuria, no enlarged prostate, no kidney stones, no kidney disease  Vascular:  no pain suggestive of claudication, no pain in feet, no leg cramps, no varicose veins, no DVT, no non-healing foot ulcer  Neuro:   no stroke, no TIA's, no seizures, no headaches, no temporary blindness one eye,  no slurred speech, + peripheral neuropathy, no chronic pain, + instability of gait, no memory/cognitive dysfunction  Musculoskeletal: + arthritis - right shoulder, no  joint swelling, no myalgias, no difficulty walking, normal mobility   Skin:   no rash, no itching, no skin infections, no pressure sores or ulcerations  Psych:   no anxiety, no depression, no nervousness, no unusual recent stress  Eyes:   no blurry vision, no floaters, no recent vision changes, + wears glasses   ENT:   + hearing loss, no loose or painful teeth, no dentures, sees his dentist regularly  Hematologic:  no easy bruising, no abnormal bleeding, no clotting disorder, no frequent epistaxis  Endocrine:  + diabetes, does not check CBG's at home     Physical Exam:   BP (!) 175/66 (BP Location: Right Arm, Patient Position: Sitting)   Pulse 67   Resp 20   Ht 5\' 10"  (1.778 m)   Wt 160 lb (72.6 kg)   SpO2 99% Comment: RA  BMI 22.96 kg/m   General:  Elderly,  well-appearing  HEENT:  Unremarkable, NCAT, PERLA, EOMI  Neck:   no JVD, no bruits, no adenopathy   Chest:   clear to auscultation, symmetrical breath sounds, no wheezes, no rhonchi   CV:   RRR, 3/6 systolic murmur RSB, no diastolic murmur  Abdomen:  soft, non-tender, no masses   Extremities:  warm, well-perfused, pedal pulses palpable, mild bilateral lower extremity edema  Rectal/GU  Deferred  Neuro:   Grossly non-focal and symmetrical throughout  Skin:   Clean and dry, no rashes, no breakdown  Diagnostic Tests:   ECHOCARDIOGRAM REPORT       Patient Name:   Omar Haley Date of Exam:  12/14/2022 Medical Rec #:  [id number]     Height:       69.0 in Accession #:    [id number]    Weight:       161.4 lb Date of Birth:  01/10/1931    BSA:          1.886 m Patient Age:    89 years      BP:           153/82 mmHg Patient Gender: M             HR:           91 bpm. Exam Location:  [address]  Procedure: 2D Echo, 3D Echo, Cardiac Doppler, Color Doppler and Strain Analysis  Indications:    R07.9 Chest pain   History:        Patient has no prior history of Echocardiogram examinations.                 Signs/Symptoms:3/6 crenscendo-decrescendon murmur,                 Dizziness/Lightheadedness and Chest Pain; Risk                 Factors:Hypertension and Dyslipidemia.   Sonographer:    Chanetta Marshall BA, RDCS Referring Phys: [name]  IMPRESSIONS    1. Left ventricular ejection fraction, by estimation, is 35 to 40%. Left ventricular ejection fraction by 3D volume is 39 %. The left ventricle has moderately decreased function. The left ventricle has no regional wall motion abnormalities. There is moderate left ventricular hypertrophy of the basal-septal segment. Left ventricular diastolic parameters are consistent with Grade I diastolic dysfunction (impaired relaxation). There is hypokinesis of the left ventricular, basal-mid inferolateral wall. The average left ventricular global longitudinal strain is -9.8 %. The global longitudinal strain is abnormal.  2. Right ventricular systolic function is normal. The right  ventricular size is normal.  3. The mitral valve is degenerative. Moderate mitral valve regurgitation. No evidence of mitral stenosis. Moderate mitral annular calcification.  4. The aortic valve is calcified. There is moderate calcification of the aortic valve. There is moderate thickening of the aortic valve. Aortic valve regurgitation is moderate. Severe aortic valve stenosis. Aortic regurgitation PHT measures 405 msec. Aortic valve area, by VTI  measures 0.95 cm. Aortic valve mean gradient measures 37.0 mmHg. Aortic valve Vmax measures 3.94 m/s. DVI 0.21.  5. The inferior vena cava is normal in size with greater than 50% respiratory variability, suggesting right atrial pressure of 3 mmHg.  6. Consider TEE for further assessment of AV and MR.  FINDINGS  Left Ventricle: Left ventricular ejection fraction, by estimation, is 35 to 40%. Left ventricular ejection fraction by 3D volume is 39 %. The left ventricle has moderately decreased function. The left ventricle has no regional wall motion abnormalities.  The average left ventricular global longitudinal strain is -9.8 %. The global longitudinal strain is abnormal. The left ventricular internal cavity size was normal in size. There is moderate left ventricular hypertrophy of the basal-septal segment. Abnormal (paradoxical) septal motion, consistent with left bundle branch block. Left ventricular diastolic parameters are consistent with Grade I diastolic dysfunction (impaired relaxation). Normal left ventricular filling pressure.  Right Ventricle: The right ventricular size is normal. No increase in right ventricular wall thickness. Right ventricular systolic function is normal.  Left Atrium: Left atrial size was normal in size.  Right Atrium: Right atrial size was normal in size.  Pericardium: There is no evidence of pericardial effusion.  Mitral Valve: The mitral valve is degenerative in appearance. There is moderate thickening of the mitral valve leaflet(s). There is mild calcification of the mitral valve leaflet(s). Moderate mitral annular calcification. Moderate mitral valve regurgitation. No evidence of mitral valve stenosis.  Tricuspid Valve: The tricuspid valve is normal in structure. Tricuspid valve regurgitation is trivial. No evidence of tricuspid stenosis.  Aortic Valve: The aortic valve is calcified. There is moderate calcification of the aortic valve. There  is moderate thickening of the aortic valve. Aortic valve regurgitation is moderate. Aortic regurgitation PHT measures 405 msec. Severe aortic stenosis  is present. Aortic valve mean gradient measures 37.0 mmHg. Aortic valve peak gradient measures 62.1 mmHg. Aortic valve area, by VTI measures 0.95 cm.  Pulmonic Valve: The pulmonic valve was normal in structure. Pulmonic valve regurgitation is trivial. No evidence of pulmonic stenosis.  Aorta: The aortic root is normal in size and structure.  Venous: The inferior vena cava is normal in size with greater than 50% respiratory variability, suggesting right atrial pressure of 3 mmHg.  IAS/Shunts: No atrial level shunt detected by color flow Doppler.    LEFT VENTRICLE PLAX 2D LVIDd:         5.40 cm         Diastology LVIDs:         4.20 cm         LV e' medial:    5.55 cm/s LV PW:         1.10 cm         LV E/e' medial:  10.2 LV IVS:        1.40 cm         LV e' lateral:   4.68 cm/s LVOT diam:     2.40 cm         LV E/e' lateral: 12.1 LV SV:  81 LV SV Index:   43              2D LVOT Area:     4.52 cm        Longitudinal                                Strain                                2D Strain GLS  -10.4 %                                (A2C):                                2D Strain GLS  -9.9 %                                (A3C):                                2D Strain GLS  -9.1 %                                (A4C):                                2D Strain GLS  -9.8 %                                Avg:                                  3D Volume EF                                LV 3D EF:    Left                                             ventricul                                             ar                                             ejection                                             fraction  by 3D                                             volume is                                              39 %.                                  3D Volume EF:                                3D EF:        39 %                                LV EDV:       158 ml                                LV ESV:       96 ml                                LV SV:        62 ml  RIGHT VENTRICLE             IVC RV Basal diam:  3.50 cm     IVC diam: 1.40 cm RV Mid diam:    2.70 cm RV S prime:     15.80 cm/s TAPSE (M-mode): 2.6 cm RVSP:           52.0 mmHg  LEFT ATRIUM             Index        RIGHT ATRIUM           Index LA diam:        4.40 cm 2.33 cm/m   RA Pressure: 3.00 mmHg LA Vol (A2C):   48.5 ml 25.72 ml/m  RA Area:     10.50 cm LA Vol (A4C):   53.8 ml 28.53 ml/m  RA Volume:   21.60 ml  11.45 ml/m LA Biplane Vol: 53.7 ml 28.47 ml/m  AORTIC VALVE AV Area (Vmax):    1.05 cm AV Area (Vmean):   0.88 cm AV Area (VTI):     0.95 cm AV Vmax:           394.00 cm/s AV Vmean:          284.500 cm/s AV VTI:            0.853 m AV Peak Grad:      62.1 mmHg AV Mean Grad:      37.0 mmHg LVOT Vmax:         91.45 cm/s LVOT Vmean:        55.300 cm/s LVOT VTI:          0.180 m LVOT/AV VTI ratio: 0.21 AI PHT:  405 msec   AORTA Ao Root diam: 3.50 cm Ao Asc diam:  3.40 cm  MITRAL VALVE                TRICUSPID VALVE MV Area (PHT): 3.81 cm     TR Peak grad:   49.0 mmHg MV Decel Time: 199 msec     TR Vmax:        350.00 cm/s MV E velocity: 56.60 cm/s   Estimated RAP:  3.00 mmHg MV A velocity: 121.00 cm/s  RVSP:           52.0 mmHg MV E/A ratio:  0.47                             SHUNTS                             Systemic VTI:  0.18 m                             Systemic Diam: 2.40 cm  Armanda Magic MD Electronically signed by Armanda Magic MD Signature Date/Time: 12/14/2022/11:16:34 AM       Final      Physicians  Panel Physicians Referring Physician Case Authorizing Physician  [name], MD (Primary)     Procedures  RIGHT/LEFT HEART  CATH AND CORONARY ANGIOGRAPHY   Conclusion  1.  Mild to moderate diffuse obstructive coronary artery disease with no high-grade obstructions. 2.  Mean RA pressure of 3, RV pressure of 32/0 with RV end-diastolic pressure of 5 mmHg, mean wedge pressure of 6 mmHg, and mean PA pressure of 12 mmHg with a Fick cardiac output of 8.2 L/min and Fick cardiac index of 4.3 L/min/m.   Recommendation: Continue evaluation for aortic valve intervention.   Indications  Nonrheumatic aortic valve stenosis [I35.0 (ICD-10-CM)]   Procedural Details  Technical Details The patient is an 88 year old male with a history of severe symptomatic aortic stenosis, cardiomyopathy with ejection fraction of 35 to 40%, hypertension, hypothyroidism, type 2 diabetes who is evaluated in the outpatient setting.  Due to aortic stenosis and decreasing left ventricular ejection fraction he is referred for right heart catheterization and coronary angiography prior to an aortic valve intervention.  After obtaining consent the patient brought to the cardiac catheterization laboratory and prepped and draped in sterile fashion.  Xylocaine was used to anesthetize the area around the right antecubital IV and a 5 French Terumo glide sheath was placed.  The right radial site was anesthetized and a 6 [place] Terumo glide sheath was placed.  5000 units heparin and 5 mg of verapamil were administered through the sheath.  A 6 [place] Tig catheter was used for coronary angiography of the left system and a 6 [place] JR4 catheter was used for angiography of the right system.  Following review of the angiogram, all equipment was removed from the body, manual pressure was applied to the antecubital site and a TR band was placed.  There are no acute complications. Estimated blood loss <50 mL.   During this procedure medications were administered to achieve and maintain moderate conscious sedation while the patient's heart rate, blood pressure, and oxygen  saturation were continuously monitored and I was present face-to-face 100% of this time.   Medications (Filter: Administrations occurring from 0726 to 0830 on 01/06/23) fentaNYL (SUBLIMAZE) injection (mcg)  Total dose: 25 mcg Date/Time Rate/Dose/Volume Action   01/06/23 0740 25 mcg Given   midazolam (VERSED) injection (mg)  Total dose: 1 mg Date/Time Rate/Dose/Volume Action   01/06/23 0740 1 mg Given   Heparin (Porcine) in NaCl 1000-0.9 UT/500ML-% SOLN (mL)  Total volume: 1,000 mL Date/Time Rate/Dose/Volume Action   01/06/23 0749 500 mL Given   0749 500 mL Given   lidocaine (PF) (XYLOCAINE) 1 % injection (mL)  Total volume: 4 mL Date/Time Rate/Dose/Volume Action   01/06/23 0749 2 mL Given   0750 2 mL Given   Radial Cocktail/Verapamil only (mL)  Total volume: 10 mL Date/Time Rate/Dose/Volume Action   01/06/23 0751 10 mL Given   heparin sodium (porcine) injection (Units)  Total dose: 5,000 Units Date/Time Rate/Dose/Volume Action   01/06/23 0751 5,000 Units Given   iohexol (OMNIPAQUE) 350 MG/ML injection (mL)  Total volume: 55 mL Date/Time Rate/Dose/Volume Action   01/06/23 0822 55 mL Given    Sedation Time  Sedation Time Physician-1: 36 minutes 30 seconds Contrast     Administrations occurring from 0726 to 0830 on 01/06/23:  Medication Name Total Dose  iohexol (OMNIPAQUE) 350 MG/ML injection 55 mL   Radiation/Fluoro  Fluoro time: 9.3 (min) DAP: 14146 (mGycm2) Cumulative Air Kerma: 212 (mGy) Complications  Complications documented before study signed (01/06/2023  8:57 AM)   No complications were associated with this study.  Documented by Justice Rocher B - 01/06/2023  8:27 AM     Coronary Findings  Diagnostic Dominance: Right Left Anterior Descending  There is mild diffuse disease throughout the vessel.    First Diagonal Branch  There is mild disease in the vessel.    Left Circumflex  There is moderate diffuse disease throughout the vessel.    Right  Coronary Artery  There is mild diffuse disease throughout the vessel.    Intervention   No interventions have been documented.   Coronary Diagrams  Diagnostic Dominance: Right  Intervention   Implants   No implant documentation for this case.   Syngo Images   Show images for CARDIAC CATHETERIZATION Images on Long Term Storage   Show images for Felice, [place] to Procedure Log  Procedure Log    Hemo Data  Flowsheet Row Most Recent Value  Fick Cardiac Output 8.2 L/min  Fick Cardiac Output Index 4.31 (L/min)/BSA  RA A Wave 4 mmHg  RA V Wave 4 mmHg  RA Mean 3 mmHg  RV Systolic Pressure 32 mmHg  RV Diastolic Pressure 0 mmHg  RV EDP 5 mmHg  PA Systolic Pressure 24 mmHg  PA Diastolic Pressure 10 mmHg  PA Mean 12 mmHg  PW A Wave 13 mmHg  PW V Wave 11 mmHg  PW Mean 8 mmHg  AO Systolic Pressure 115 mmHg  AO Diastolic Pressure 67 mmHg  AO Mean 71 mmHg  QP/QS 0.95  TPVR Index 2.94 HRUI  TSVR Index 16.48 HRUI  PVR SVR Ratio 0.06  TPVR/TSVR Ratio 0.18   ADDENDUM REPORT: 12/29/2022 08:49   ADDENDUM: Extracardiac findings will be described separately under dictation for contemporaneously obtained CTA chest, abdomen and pelvis dated 12/27/2022. Please see that dictation for full description of relevant extracardiac findings.     Electronically Signed   By: Trudie Reed M.D.   On: 12/29/2022 08:49    Addended by Florencia Reasons, MD on 12/29/2022  8:51 AM    Study Result  Narrative & Impression  CLINICAL DATA:  Aortic valve replacement (TAVR), pre-op eval   EXAM: Cardiac  TAVR CT   TECHNIQUE: The patient was scanned on a Siemens Force 192 slice scanner. A 120 kV retrospective scan was triggered in the descending thoracic aorta at 111 HU's. Gantry rotation speed was 270 msecs and collimation was .9 mm. The 3D data set was reconstructed in 5% intervals of the R-R cycle. Systolic and diastolic phases were analyzed on a dedicated work station using  MPR, MIP and VRT modes. The patient received 95mL OMNIPAQUE IOHEXOL 350 MG/ML SOLN of contrast.   FINDINGS: Aortic Valve:   Tricuspid aortic valve with severely reduced cusp excursion. Severely thickened and severely calcified aortic valve cusps.   AV calcium score: 2885   Virtual Basal Annulus Measurements:   Maximum/Minimum Diameter: 28.2 x 23.4 mm   Perimeter: 80.5 mm   Area:  504 mm2   Trivial LVOT calcifications.   Membranous septal length: 6 mm   Based on these measurements, the annulus would be suitable for a 26 mm Sapien 3 valve. Alternatively, Heart Team can consider 29 mm Evolut valve. Recommend Heart Team discussion for valve selection.   Sinus of Valsalva Measurements:   Non-coronary:  32 mm   Right - coronary:  31 mm   Left - coronary:  31 mm   Sinus of Valsalva Height:   Left: 19 mm   Right: 21.4 mm   Aorta: Conventional 3 vessel branch pattern of aortic arch. Severe aortic atherosclerosis.   Sinotubular Junction:  32 mm   Ascending Thoracic Aorta:  33 mm   Aortic Arch:  26 mm   Descending Thoracic Aorta:  23 mm   Coronary Artery Height above Annulus: Coronary origins are above the ST junction.   Left main: 21.6 mm   Right coronary: 25 mm   Coronary Arteries: Normal coronary origin. Right dominance. The study was performed without use of NTG and insufficient for plaque evaluation. Coronary artery calcium seen in 3 vessel distribution. Calcium score 3452.   Optimum Fluoroscopic Angle for Delivery: LAO 12, CAU 11   OTHER:   Atria: Left atrial chamber dilation.   Left atrial appendage: No thrombus.   Mitral valve: Grossly normal, mild mitral annular calcifications.   Pulmonary artery: Normal caliber.   Pulmonary veins: Normal variant anatomy (3 on right and 2 on left).   IMPRESSION: 1. 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 Severely thickened and severely calcified aortic valve cusps. 2. Aortic valve  calcium score: 2885 3. Annulus area: 504 mm2, suitable for 26 mm Sapien 3 valve. Trivial LVOT calcifications. Membranous septal length 6 mm. 4. Sufficient coronary artery heights from annulus. 5. Optimum fluoroscopic angle for delivery: LAO 12, CAU 11   Electronically Signed: By: Weston Brass M.D. On: 12/27/2022 16:42       Narrative & Impression  CLINICAL DATA:  89 year old male with history of severe aortic stenosis under preprocedural evaluation prior to potential transcatheter aortic valve replacement (TAVR) procedure.   EXAM: CT ANGIOGRAPHY CHEST, ABDOMEN AND PELVIS   TECHNIQUE: Multidetector CT imaging through the chest, abdomen and pelvis was performed using the standard protocol during bolus administration of intravenous contrast. Multiplanar reconstructed images and MIPs were obtained and reviewed to evaluate the vascular anatomy.   RADIATION DOSE REDUCTION: This exam was performed according to the departmental dose-optimization program which includes automated exposure control, adjustment of the mA and/or kV according to patient size and/or use of iterative reconstruction technique.   CONTRAST:  95mL OMNIPAQUE IOHEXOL 350 MG/ML SOLN   COMPARISON:  PET-CT 05/01/2021.   FINDINGS:  CTA CHEST FINDINGS   Cardiovascular: Heart size is normal. There is no significant pericardial fluid, thickening or pericardial calcification. There is aortic atherosclerosis, as well as atherosclerosis of the great vessels of the mediastinum and the coronary arteries, including calcified atherosclerotic plaque in the left main, left anterior descending, left circumflex and right coronary arteries. Severe thickening and calcification of the aortic valve. Mild calcifications of the mitral annulus.   Mediastinum/Lymph Nodes: No pathologically enlarged mediastinal or hilar lymph nodes. Moderate-sized hiatal hernia. No axillary lymphadenopathy.   Lungs/Pleura: No acute  consolidative airspace disease. No pleural effusions. No definite suspicious appearing pulmonary nodules or masses are noted. Mild scarring in the lung bases bilaterally.   Musculoskeletal/Soft Tissues: There are no aggressive appearing lytic or blastic lesions noted in the visualized portions of the skeleton.   CTA ABDOMEN AND PELVIS FINDINGS   Hepatobiliary: Small calcified granuloma in segment 2 of the liver incidentally noted. No suspicious cystic or solid hepatic lesions. No intra or extrahepatic biliary ductal dilatation. Gallbladder is unremarkable in appearance.   Pancreas: Small low-attenuation lesions are noted in the distal body and tail of the pancreas, largest of which measures only 9 x 6 mm (axial image 130 of series 6). No solid pancreatic mass. No pancreatic ductal dilatation. No peripancreatic fluid collections or inflammatory changes.   Spleen: Unremarkable.   Adrenals/Urinary Tract: Left-sided extrarenal pelvis (normal anatomical variant) incidentally noted. 1.8 cm exophytic low-attenuation lesion in the posterior aspect of the lower pole of the right kidney, compatible with a simple (Bosniak class 1) cyst, which requires no imaging follow-up. No hydroureteronephrosis. Small diverticuli noted in the urinary bladder. Urinary bladder is otherwise unremarkable in appearance. Bilateral adrenal glands are normal in appearance.   Stomach/Bowel: The appearance of the stomach is normal. There is no pathologic dilatation of small bowel or colon. Status post right hemicolectomy.   Vascular/Lymphatic: Aortic atherosclerosis, with vascular findings and measurements pertinent to potential TAVR procedure, as detailed below. No lymphadenopathy noted in the abdomen or pelvis.   Reproductive: Fiducial markers in the prostate gland. Prostate gland and seminal vesicles are otherwise unremarkable in appearance.   Other: No significant volume of ascites.  No  pneumoperitoneum.   Musculoskeletal: There are no aggressive appearing lytic or blastic lesions noted in the visualized portions of the skeleton.   VASCULAR MEASUREMENTS PERTINENT TO TAVR:   AORTA:   Minimal Aortic Diameter-1.3 x 0.9 mm   Severity of Aortic Calcification-severe   RIGHT PELVIS:   Right Common Iliac Artery -   Minimal Diameter-7.8 x 8.7 mm   Tortuosity-mild   Calcification-moderate   Right External Iliac Artery -   Minimal Diameter-7.6 x 7.1 mm   Tortuosity - mild   Calcification-mild   Right Common Femoral Artery -   Minimal Diameter-6.9 x 8.3 mm   Tortuosity - mild   Calcification-mild   LEFT PELVIS:   Left Common Iliac Artery -   Minimal Diameter-6.0 x 7.2 mm   Tortuosity - mild   Calcification-moderate   Left External Iliac Artery -   Minimal Diameter-5.9 x 6.5 mm   Tortuosity - mild   Calcification-mild   Left Common Femoral Artery -   Minimal Diameter-7.3 x 6.0 mm   Tortuosity - mild   Calcification-mild   Review of the MIP images confirms the above findings.   IMPRESSION: 1. Vascular findings and measurements pertinent to potential TAVR procedure, as detailed above. 2. Severe thickening and calcification of the aortic valve, compatible with reported clinical history of severe  aortic stenosis. 3. Aortic atherosclerosis, in addition to left main and three-vessel coronary artery disease. Please note that although the presence of coronary artery calcium documents the presence of coronary artery disease, the severity of this disease and any potential stenosis cannot be assessed on this non-gated CT examination. Assessment for potential risk factor modification, dietary therapy or pharmacologic therapy may be warranted, if clinically indicated. 4. Moderate-sized hiatal hernia. 5. Low-attenuation lesions in the body and tail of the pancreas measuring up to 9 x 6 mm, nonspecific, but statistically likely benign lesions  such as small side branch IPMN (intraductal papillary mucinous neoplasm). Follow-up abdominal MRI with and without IV gadolinium or pancreatic protocol CT scan should be considered in 2 years to ensure the stability of these findings. This recommendation follows ACR consensus guidelines: Management of Incidental Pancreatic Cysts: A White Paper of the ACR Incidental Findings Committee. J Am Coll Radiol 2017;14:911-923.     Electronically Signed   By: Trudie Reed M.D.   On: 12/29/2022 09:42     Impression:  This 89 year old gentleman has stage D, severe, symptomatic aortic stenosis with NYHA class III symptoms of exertional fatigue and shortness of breath as well as exertional chest discomfort and episodes of dizziness and near syncope.  His echocardiogram shows a reduced left ventricular ejection fraction of 35 to 40% with moderate LVH consistent with chronic combined systolic and diastolic congestive heart failure.  His echocardiogram shows a severely calcified and thickened aortic valve with a mean gradient of 37 mmHg and a peak gradient of 62 mmHg.  Aortic valve area by VTI is 0.95 cm with a dimensionless index of 0.21 consistent with severe aortic stenosis.  Cardiac catheterization shows mild to moderate diffuse coronary disease with no high-grade stenoses.  I agree that aortic valve replacement is indicated in this patient for relief of his progressive symptoms and to prevent further left ventricular dysfunction.  Given his advanced age I think transcatheter aortic valve replacement would be the best treatment option for him.  His gated cardiac CTA shows anatomy suitable for TAVR using a 26 mm SAPIEN 3 valve.  His abdominal and pelvic CTA shows adequate pelvic vascular anatomy to allow transfemoral insertion.  The patient and his wife were counseled at length regarding treatment alternatives for management of severe symptomatic aortic stenosis. The risks and benefits of surgical  intervention has been discussed in detail. Long-term prognosis with medical therapy was discussed. Alternative approaches such as conventional surgical aortic valve replacement, transcatheter aortic valve replacement, and palliative medical therapy were compared and contrasted at length. This discussion was placed in the context of the patient's own specific clinical presentation and past medical history. All of their questions have been addressed.   Following the decision to proceed with transcatheter aortic valve replacement, a discussion was held regarding what types of management strategies would be attempted intraoperatively in the event of life-threatening complications, including whether or not the patient would be considered a candidate for the use of cardiopulmonary bypass and/or conversion to open sternotomy for attempted surgical intervention.  He is still relatively active for his age and I would consider him a candidate for emergent sternotomy to manage any intraoperative complications.  The patient is aware of the fact that transient use of cardiopulmonary bypass may be necessary. The patient has been advised of a variety of complications that might develop including but not limited to risks of death, stroke, paravalvular leak, aortic dissection or other major vascular complications, aortic annulus rupture, device embolization,  cardiac rupture or perforation, mitral regurgitation, acute myocardial infarction, arrhythmia, heart block or bradycardia requiring permanent pacemaker placement, congestive heart failure, respiratory failure, renal failure, pneumonia, infection, other late complications related to structural valve deterioration or migration, or other complications that might ultimately cause a temporary or permanent loss of functional independence or other long term morbidity. The patient provides full informed consent for the procedure as described and all questions were answered.      Plan:  He will be scheduled for transfemoral TAVR using a SAPIEN 3 valve on 01/18/2023.  I spent 60 minutes performing this consultation and > 50% of this time was spent face to face counseling and coordinating the care of this patient's severe symptomatic aortic stenosis.   [name], MD 01/12/2023

## 2023-01-14 ENCOUNTER — Encounter (HOSPITAL_COMMUNITY)
Admission: RE | Admit: 2023-01-14 | Discharge: 2023-01-14 | Disposition: A | Discharge status: Home / Self Care | Payer: Medicare Other | Source: Ambulatory Visit | Attending: Internal Medicine | Admitting: Internal Medicine

## 2023-01-14 ENCOUNTER — Ambulatory Visit (HOSPITAL_COMMUNITY)
Admission: RE | Admit: 2023-01-14 | Discharge: 2023-01-14 | Disposition: A | Discharge status: Home / Self Care | Payer: Medicare Other | Source: Ambulatory Visit | Attending: Internal Medicine | Admitting: Internal Medicine

## 2023-01-14 ENCOUNTER — Other Ambulatory Visit: Payer: Self-pay

## 2023-01-14 DIAGNOSIS — I350 Nonrheumatic aortic (valve) stenosis: Secondary | ICD-10-CM | POA: Insufficient documentation

## 2023-01-14 DIAGNOSIS — Z1152 Encounter for screening for COVID-19: Secondary | ICD-10-CM | POA: Diagnosis not present

## 2023-01-14 DIAGNOSIS — Z01818 Encounter for other preprocedural examination: Secondary | ICD-10-CM | POA: Diagnosis not present

## 2023-01-14 HISTORY — DX: Atherosclerotic heart disease of native coronary artery without angina pectoris: I25.10

## 2023-01-14 HISTORY — DX: Heart failure, unspecified: I50.9

## 2023-01-14 HISTORY — DX: Nonrheumatic aortic (valve) stenosis: I35.0

## 2023-01-14 LAB — COMPREHENSIVE METABOLIC PANEL
ALT: 16 U/L (ref 0–44)
AST: 17 U/L (ref 15–41)
Albumin: 4 g/dL (ref 3.5–5.0)
Alkaline Phosphatase: 58 U/L (ref 38–126)
Anion gap: 9 (ref 5–15)
BUN: 11 mg/dL (ref 8–23)
CO2: 26 mmol/L (ref 22–32)
Calcium: 9.3 mg/dL (ref 8.9–10.3)
Chloride: 99 mmol/L (ref 98–111)
Creatinine, Ser: 0.89 mg/dL (ref 0.61–1.24)
GFR, Estimated: >60 mL/min (ref >60)
Glucose, Bld: 179 mg/dL — ABNORMAL HIGH (ref 70–99)
Potassium: 4.7 mmol/L (ref 3.5–5.1)
Sodium: 134 mmol/L — ABNORMAL LOW (ref 135–145)
Total Bilirubin: 0.4 mg/dL (ref 0.3–1.2)
Total Protein: 6.6 g/dL (ref 6.5–8.1)

## 2023-01-14 LAB — URINALYSIS, ROUTINE W REFLEX MICROSCOPIC
Bacteria, UA: NONE SEEN
Bilirubin Urine: NEGATIVE
Glucose, UA: 50 mg/dL — AB
Hgb urine dipstick: NEGATIVE
Ketones, ur: NEGATIVE mg/dL
Leukocytes,Ua: NEGATIVE
Nitrite: NEGATIVE
Protein, ur: NEGATIVE mg/dL
Specific Gravity, Urine: 1.009 (ref 1.005–1.030)
pH: 6 (ref 5.0–8.0)

## 2023-01-14 LAB — TYPE AND SCREEN
ABO/RH(D): A NEG
Antibody Screen: NEGATIVE

## 2023-01-14 LAB — SURGICAL PCR SCREEN
MRSA, PCR: NEGATIVE
Staphylococcus aureus: POSITIVE — AB

## 2023-01-14 LAB — CBC
HCT: 39.6 % (ref 39.0–52.0)
Hemoglobin: 12.8 g/dL — ABNORMAL LOW (ref 13.0–17.0)
MCH: 28.5 pg (ref 26.0–34.0)
MCHC: 32.3 g/dL (ref 30.0–36.0)
MCV: 88.2 fL (ref 80.0–100.0)
Platelets: 167 10*3/uL (ref 150–400)
RBC: 4.49 MIL/uL (ref 4.22–5.81)
RDW: 14 % (ref 11.5–15.5)
WBC: 4.6 10*3/uL (ref 4.0–10.5)
nRBC: 0 % (ref 0.0–0.2)

## 2023-01-14 LAB — PROTIME-INR
INR: 1.1 (ref 0.8–1.2)
Prothrombin Time: 13.6 seconds (ref 11.4–15.2)

## 2023-01-14 NOTE — Progress Notes (Signed)
Patient signed all consents at PAT lab appointment. CHG soap and instructions were given to patient. CHG surgical prep reviewed with patient and all questions answered.  Pt denies any respiratory illness/infection in the last two months.  EKG not completed at PAT. Pt already has one completed within the ordered time frame. Order discontinued.

## 2023-01-15 LAB — SARS CORONAVIRUS 2 (TAT 6-24 HRS): SARS Coronavirus 2: NEGATIVE

## 2023-01-17 ENCOUNTER — Encounter (HOSPITAL_COMMUNITY): Payer: Self-pay

## 2023-01-17 MED ORDER — NOREPINEPHRINE 4 MG/250ML-% IV SOLN
0.0000 ug/min | INTRAVENOUS | Status: AC
  Administered 2023-01-18: 2 ug/min via INTRAVENOUS
  Filled 2023-01-17: qty 250

## 2023-01-17 MED ORDER — CEFAZOLIN SODIUM-DEXTROSE 2-4 GM/100ML-% IV SOLN
2.0000 g | INTRAVENOUS | Status: AC
  Administered 2023-01-18: 2 g via INTRAVENOUS
  Filled 2023-01-17: qty 100

## 2023-01-17 MED ORDER — HEPARIN 30,000 UNITS/1000 ML (OHS) CELLSAVER SOLUTION
Status: DC
  Filled 2023-01-17 (×2): qty 1000

## 2023-01-17 MED ORDER — DEXMEDETOMIDINE HCL IN NACL 400 MCG/100ML IV SOLN
0.1000 ug/kg/h | INTRAVENOUS | Status: AC
  Administered 2023-01-18: 72.6 ug via INTRAVENOUS
  Filled 2023-01-17 (×2): qty 100

## 2023-01-17 MED ORDER — MAGNESIUM SULFATE 50 % IJ SOLN
40.0000 meq | INTRAMUSCULAR | Status: DC
  Filled 2023-01-17 (×2): qty 9.85

## 2023-01-17 MED ORDER — POTASSIUM CHLORIDE 2 MEQ/ML IV SOLN
80.0000 meq | INTRAVENOUS | Status: DC
  Filled 2023-01-17 (×2): qty 40

## 2023-01-17 NOTE — H&P (Signed)
[address]       [place] H[place] [address]             [phone]      Cardiothoracic Surgery Admission History and Physical   PCP is Omar Penna, MD Referring Provider is Omar Skeans, MD Primary Cardiologist is Omar Skeans, MD   Reason for admission: Severe aortic stenosis   HPI:   The patient is an 89 year old gentleman with a history of hypertension, hyperlipidemia, diabetes, vitamin B12 deficiency, and recently diagnosed severe aortic stenosis who presents with a several month history of progressive exertional fatigue and shortness of breath as well as substernal chest pain radiating into the right shoulder.  The symptoms were worse when he was mowing his yard or walking up an incline or steps.  He had a presyncopal episode when walking to the mailbox which prompted cardiology referral.  Echocardiogram on 12/14/2022 showed a calcified aortic valve with moderate thickening and a mean gradient of 37 mmHg.  Peak gradient was 62 mmHg with a valve area by VTI of 0.95 cm.  There is moderate aortic insufficiency with a pressure half-time of 405 ms.  There was moderate mitral regurgitation.  Left ventricular ejection fraction was reduced to 35 to 40% with moderate LVH and grade 1 diastolic dysfunction.   The patient is married and lives with his wife.  He is a retired Estate manager/land agent.  He was previously very active in his yard and garden and would like to get back to that.  He and his wife noted that his symptoms have been occurring more frequently and with less exertion over the past couple months.       Past Medical History:  Diagnosis Date   B12 deficiency     Cancer (HCC)      melanoma   Diabetes mellitus without complication (HCC)     DJD (degenerative joint disease)     Dupuytren's contracture     Glaucoma     Hemorrhoids     History of shingles     Hyperlipidemia     Hypertension     Neuropathy     Prostatitis     Unifocal PVCs      Vitamin D deficiency             Past Surgical History:  Procedure Laterality Date   COLON SURGERY   1991    rt hemicolectomy   HERNIA REPAIR       MELANOMA EXCISION   11/2005&04/2011    skin   RIGHT/LEFT HEART CATH AND CORONARY ANGIOGRAPHY N/A 01/06/2023    Procedure: RIGHT/LEFT HEART CATH AND CORONARY ANGIOGRAPHY;  Surgeon: Omar Pyo, MD;  Location: [facility];  Service: Cardiovascular;  Laterality: N/A;           Family History  Problem Relation Age of Onset   Heart failure Mother     Cerebral aneurysm Father        Social History         Socioeconomic History   Marital status: Married      Spouse name: Not on file   Number of children: Not on file   Years of education: Not on file   Highest education level: Not on file  Occupational History   Not on file  Tobacco Use   Smoking status: Never   Smokeless tobacco: Not on file  Substance and Sexual Activity   Alcohol use: No   Drug  use: No   Sexual activity: Not on file  Other Topics Concern   Not on file  Social History Narrative   Not on file    Social Determinants of Health    Financial Resource Strain: Not on file  Food Insecurity: Not on file  Transportation Needs: Not on file  Physical Activity: Not on file  Stress: Not on file  Social Connections: Not on file  Intimate Partner Violence: Not on file             Prior to Admission medications   Medication Sig Start Date End Date Taking? Authorizing Provider  acetaminophen (TYLENOL) 650 MG CR tablet Take 650 mg by mouth every 8 (eight) hours as needed for pain.     Yes [provider]  Aspirin 81 MG EC tablet Take 81 mg by mouth daily.     Yes [provider]  ibuprofen (ADVIL,MOTRIN) 200 MG tablet Take 200 mg by mouth every 4 (four) hours as needed for pain, mild pain or moderate pain.     Yes [provider]  latanoprost (XALATAN) 0.005 % ophthalmic solution Place 1 drop into both eyes at bedtime.     Yes  [provider]  levothyroxine (SYNTHROID) 50 MCG tablet Take 50 mcg by mouth daily before breakfast. 04/05/18   Yes [provider]  losartan-hydrochlorothiazide (HYZAAR) 100-25 MG tablet Take 1 tablet by mouth daily.     Yes [provider]            Current Outpatient Medications  Medication Sig Dispense Refill   acetaminophen (TYLENOL) 650 MG CR tablet Take 650 mg by mouth every 8 (eight) hours as needed for pain.       Aspirin 81 MG EC tablet Take 81 mg by mouth daily.       ibuprofen (ADVIL,MOTRIN) 200 MG tablet Take 200 mg by mouth every 4 (four) hours as needed for pain, mild pain or moderate pain.       latanoprost (XALATAN) 0.005 % ophthalmic solution Place 1 drop into both eyes at bedtime.       levothyroxine (SYNTHROID) 50 MCG tablet Take 50 mcg by mouth daily before breakfast.       losartan-hydrochlorothiazide (HYZAAR) 100-25 MG tablet Take 1 tablet by mouth daily.        No current facility-administered medications for this visit.           Allergies  Allergen Reactions   Enalapril        cough   Gemfibrozil Other (See Comments)      Unknown   Micardis [Telmisartan]        Diarrhea     Zetia [Ezetimibe]        laryngitis          Review of Systems:               General:                      normal appetite, + decreased energy, no weight gain, no weight loss, no fever             Cardiac:                       + chest pain with exertion, no chest pain at rest, +SOB with mild exertion, no resting SOB, no PND, no orthopnea, no palpitations, no arrhythmia, no atrial fibrillation, + LE edema, + dizzy spells,  no syncope             Respiratory:                 + exertional shortness of breath, no home oxygen, no productive cough, no dry cough, no bronchitis, no wheezing, no hemoptysis, no asthma, no pain with inspiration or cough, no sleep apnea, no CPAP at night             GI:                               no difficulty swallowing, no  reflux, no frequent heartburn, no hiatal hernia, no abdominal pain, no constipation, no diarrhea, no hematochezia, no hematemesis, no melena             GU:                              no dysuria,  no frequency, no urinary tract infection, no hematuria, no enlarged prostate, no kidney stones, no kidney disease             Vascular:                     no pain suggestive of claudication, no pain in feet, no leg cramps, no varicose veins, no DVT, no non-healing foot ulcer             Neuro:                         no stroke, no TIA's, no seizures, no headaches, no temporary blindness one eye,  no slurred speech, + peripheral neuropathy, no chronic pain, + instability of gait, no memory/cognitive dysfunction             Musculoskeletal:         + arthritis - right shoulder, no joint swelling, no myalgias, no difficulty walking, normal mobility              Skin:                            no rash, no itching, no skin infections, no pressure sores or ulcerations             Psych:                         no anxiety, no depression, no nervousness, no unusual recent stress             Eyes:                           no blurry vision, no floaters, no recent vision changes, + wears glasses              ENT:                            + hearing loss, no loose or painful teeth, no dentures, sees his dentist regularly             Hematologic:               no easy bruising, no abnormal bleeding, no clotting disorder, no frequent epistaxis  Endocrine:                   + diabetes, does not check CBG's at home                            Physical Exam:               BP (!) 175/66 (BP Location: Right Arm, Patient Position: Sitting)   Pulse 67   Resp 20   Ht 5\' 10"  (1.778 m)   Wt 160 lb (72.6 kg)   SpO2 99% Comment: RA  BMI 22.96 kg/m              General:                      Elderly,  well-appearing             HEENT:                       Unremarkable, NCAT, PERLA, EOMI             Neck:                            no JVD, no bruits, no adenopathy              Chest:                          clear to auscultation, symmetrical breath sounds, no wheezes, no rhonchi              CV:                              RRR, 3/6 systolic murmur RSB, no diastolic murmur             Abdomen:                    soft, non-tender, no masses              Extremities:                 warm, well-perfused, pedal pulses palpable, mild bilateral lower extremity edema             Rectal/GU                   Deferred             Neuro:                         Grossly non-focal and symmetrical throughout             Skin:                            Clean and dry, no rashes, no breakdown   Diagnostic Tests:    ECHOCARDIOGRAM REPORT       Patient Name:   Omar Haley Date of Exam: 12/14/2022 Medical Rec #:  [id number]     Height:       69.0 in Accession #:    [id number]    Weight:       161.4 lb Date of Birth:  [birth year]  BSA:          1.886 m Patient Age:    89 years      BP:           153/82 mmHg Patient Gender: M             HR:           91 bpm. Exam Location:  [address]  Procedure: 2D Echo, 3D Echo, Cardiac Doppler, Color Doppler and Strain Analysis  Indications:    R07.9 Chest pain   History:        Patient has no prior history of Echocardiogram examinations.                 Signs/Symptoms:3/6 crenscendo-decrescendon murmur,                 Dizziness/Lightheadedness and Chest Pain; Risk                 Factors:Hypertension and Dyslipidemia.   Sonographer:    Chanetta Marshall BA, RDCS Referring Phys: Omar Haley  IMPRESSIONS    1. Left ventricular ejection fraction, by estimation, is 35 to 40%. Left ventricular ejection fraction by 3D volume is 39 %. The left ventricle has moderately decreased function. The left ventricle has no regional wall motion abnormalities. There is moderate left ventricular hypertrophy of the basal-septal segment. Left ventricular diastolic parameters  are consistent with Grade I diastolic dysfunction (impaired relaxation). There is hypokinesis of the left ventricular, basal-mid inferolateral wall. The average left ventricular global longitudinal strain is -9.8 %. The global longitudinal strain is abnormal.  2. Right ventricular systolic function is normal. The right ventricular size is normal.  3. The mitral valve is degenerative. Moderate mitral valve regurgitation. No evidence of mitral stenosis. Moderate mitral annular calcification.  4. The aortic valve is calcified. There is moderate calcification of the aortic valve. There is moderate thickening of the aortic valve. Aortic valve regurgitation is moderate. Severe aortic valve stenosis. Aortic regurgitation PHT measures 405 msec. Aortic valve area, by VTI measures 0.95 cm. Aortic valve mean gradient measures 37.0 mmHg. Aortic valve Vmax measures 3.94 m/s. DVI 0.21.  5. The inferior vena cava is normal in size with greater than 50% respiratory variability, suggesting right atrial pressure of 3 mmHg.  6. Consider TEE for further assessment of AV and MR.  FINDINGS  Left Ventricle: Left ventricular ejection fraction, by estimation, is 35 to 40%. Left ventricular ejection fraction by 3D volume is 39 %. The left ventricle has moderately decreased function. The left ventricle has no regional wall motion abnormalities.  The average left ventricular global longitudinal strain is -9.8 %. The global longitudinal strain is abnormal. The left ventricular internal cavity size was normal in size. There is moderate left ventricular hypertrophy of the basal-septal segment. Abnormal (paradoxical) septal motion, consistent with left bundle branch block. Left ventricular diastolic parameters are consistent with Grade I diastolic dysfunction (impaired relaxation). Normal left ventricular filling pressure.  Right Ventricle: The right ventricular size is normal. No increase in right ventricular  wall thickness. Right ventricular systolic function is normal.  Left Atrium: Left atrial size was normal in size.  Right Atrium: Right atrial size was normal in size.  Pericardium: There is no evidence of pericardial effusion.  Mitral Valve: The mitral valve is degenerative in appearance. There is moderate thickening of the mitral valve leaflet(s). There is mild calcification of the mitral valve leaflet(s). Moderate mitral annular calcification. Moderate mitral valve regurgitation. No evidence  of mitral valve stenosis.  Tricuspid Valve: The tricuspid valve is normal in structure. Tricuspid valve regurgitation is trivial. No evidence of tricuspid stenosis.  Aortic Valve: The aortic valve is calcified. There is moderate calcification of the aortic valve. There is moderate thickening of the aortic valve. Aortic valve regurgitation is moderate. Aortic regurgitation PHT measures 405 msec. Severe aortic stenosis  is present. Aortic valve mean gradient measures 37.0 mmHg. Aortic valve peak gradient measures 62.1 mmHg. Aortic valve area, by VTI measures 0.95 cm.  Pulmonic Valve: The pulmonic valve was normal in structure. Pulmonic valve regurgitation is trivial. No evidence of pulmonic stenosis.  Aorta: The aortic root is normal in size and structure.  Venous: The inferior vena cava is normal in size with greater than 50% respiratory variability, suggesting right atrial pressure of 3 mmHg.  IAS/Shunts: No atrial level shunt detected by color flow Doppler.    LEFT VENTRICLE PLAX 2D LVIDd:         5.40 cm         Diastology LVIDs:         4.20 cm         LV e' medial:    5.55 cm/s LV PW:         1.10 cm         LV E/e' medial:  10.2 LV IVS:        1.40 cm         LV e' lateral:   4.68 cm/s LVOT diam:     2.40 cm         LV E/e' lateral: 12.1 LV SV:         81 LV SV Index:   43              2D LVOT Area:     4.52 cm        Longitudinal                                Strain                                 2D Strain GLS  -10.4 %                                (A2C):                                2D Strain GLS  -9.9 %                                (A3C):                                2D Strain GLS  -9.1 %                                (A4C):                                2D Strain GLS  -9.8 %  Avg:                                  3D Volume EF                                LV 3D EF:    Left                                             ventricul                                             ar                                             ejection                                             fraction                                             by 3D                                             volume is                                             39 %.                                  3D Volume EF:                                3D EF:        39 %                                LV EDV:       158 ml                                LV ESV:       96 ml                                LV SV:        62 ml  RIGHT VENTRICLE  IVC RV Basal diam:  3.50 cm     IVC diam: 1.40 cm RV Mid diam:    2.70 cm RV S prime:     15.80 cm/s TAPSE (M-mode): 2.6 cm RVSP:           52.0 mmHg  LEFT ATRIUM             Index        RIGHT ATRIUM           Index LA diam:        4.40 cm 2.33 cm/m   RA Pressure: 3.00 mmHg LA Vol (A2C):   48.5 ml 25.72 ml/m  RA Area:     10.50 cm LA Vol (A4C):   53.8 ml 28.53 ml/m  RA Volume:   21.60 ml  11.45 ml/m LA Biplane Vol: 53.7 ml 28.47 ml/m  AORTIC VALVE AV Area (Vmax):    1.05 cm AV Area (Vmean):   0.88 cm AV Area (VTI):     0.95 cm AV Vmax:           394.00 cm/s AV Vmean:          284.500 cm/s AV VTI:            0.853 m AV Peak Grad:      62.1 mmHg AV Mean Grad:      37.0 mmHg LVOT Vmax:         91.45 cm/s LVOT Vmean:        55.300 cm/s LVOT VTI:          0.180 m LVOT/AV VTI ratio: 0.21 AI  PHT:            405 msec   AORTA Ao Root diam: 3.50 cm Ao Asc diam:  3.40 cm  MITRAL VALVE                TRICUSPID VALVE MV Area (PHT): 3.81 cm     TR Peak grad:   49.0 mmHg MV Decel Time: 199 msec     TR Vmax:        350.00 cm/s MV E velocity: 56.60 cm/s   Estimated RAP:  3.00 mmHg MV A velocity: 121.00 cm/s  RVSP:           52.0 mmHg MV E/A ratio:  0.47                             SHUNTS                             Systemic VTI:  0.18 m                             Systemic Diam: 2.40 cm  Armanda Magic MD Electronically signed by Armanda Magic MD Signature Date/Time: 12/14/2022/11:16:34 AM       Final        Physicians   Panel Physicians Referring Physician Case Authorizing Physician  Omar Pyo, MD (Primary)        Procedures   RIGHT/LEFT HEART CATH AND CORONARY ANGIOGRAPHY    Conclusion   1.  Mild to moderate diffuse obstructive coronary artery disease with no high-grade obstructions. 2.  Mean RA pressure of 3, RV pressure of 32/0 with RV end-diastolic pressure of 5 mmHg, mean wedge pressure of 6 mmHg, and mean PA  pressure of 12 mmHg with a Fick cardiac output of 8.2 L/min and Fick cardiac index of 4.3 L/min/m.   Recommendation: Continue evaluation for aortic valve intervention.   Indications   Nonrheumatic aortic valve stenosis [I35.0 (ICD-10-CM)]    Procedural Details   Technical Details The patient is an 89 year old male with a history of severe symptomatic aortic stenosis, cardiomyopathy with ejection fraction of 35 to 40%, hypertension, hypothyroidism, type 2 diabetes who is evaluated in the outpatient setting.  Due to aortic stenosis and decreasing left ventricular ejection fraction he is referred for right heart catheterization and coronary angiography prior to an aortic valve intervention.  After obtaining consent the patient brought to the cardiac catheterization laboratory and prepped and draped in sterile fashion.  Xylocaine was used to anesthetize  the area around the right antecubital IV and a 5 French Terumo glide sheath was placed.  The right radial site was anesthetized and a 6 [place] Terumo glide sheath was placed.  5000 units heparin and 5 mg of verapamil were administered through the sheath.  A 6 [place] Tig catheter was used for coronary angiography of the left system and a 6 [place] JR4 catheter was used for angiography of the right system.  Following review of the angiogram, all equipment was removed from the body, manual pressure was applied to the antecubital site and a TR band was placed.  There are no acute complications. Estimated blood loss <50 mL.   During this procedure medications were administered to achieve and maintain moderate conscious sedation while the patient's heart rate, blood pressure, and oxygen saturation were continuously monitored and I was present face-to-face 100% of this time.    Medications (Filter: Administrations occurring from 0726 to 0830 on 01/06/23) fentaNYL (SUBLIMAZE) injection (mcg)  Total dose: 25 mcg Date/Time Rate/Dose/Volume Action    01/06/23 0740 25 mcg Given    midazolam (VERSED) injection (mg)  Total dose: 1 mg Date/Time Rate/Dose/Volume Action    01/06/23 0740 1 mg Given    Heparin (Porcine) in NaCl 1000-0.9 UT/500ML-% SOLN (mL)  Total volume: 1,000 mL Date/Time Rate/Dose/Volume Action    01/06/23 0749 500 mL Given    0749 500 mL Given    lidocaine (PF) (XYLOCAINE) 1 % injection (mL)  Total volume: 4 mL Date/Time Rate/Dose/Volume Action    01/06/23 0749 2 mL Given    0750 2 mL Given    Radial Cocktail/Verapamil only (mL)  Total volume: 10 mL Date/Time Rate/Dose/Volume Action    01/06/23 0751 10 mL Given    heparin sodium (porcine) injection (Units)  Total dose: 5,000 Units Date/Time Rate/Dose/Volume Action    01/06/23 0751 5,000 Units Given    iohexol (OMNIPAQUE) 350 MG/ML injection (mL)  Total volume: 55 mL Date/Time Rate/Dose/Volume Action    01/06/23 0822 55 mL  Given      Sedation Time   Sedation Time Physician-1: 36 minutes 30 seconds Contrast        Administrations occurring from 0726 to 0830 on 01/06/23:  Medication Name Total Dose  iohexol (OMNIPAQUE) 350 MG/ML injection 55 mL    Radiation/Fluoro   Fluoro time: 9.3 (min) DAP: 14146 (mGycm2) Cumulative Air Kerma: 212 (mGy) Complications   Complications documented before study signed (01/06/2023  8:57 AM)    No complications were associated with this study.  Documented by Justice Rocher B - 01/06/2023  8:27 AM      Coronary Findings   Diagnostic Dominance: Right Left Anterior Descending  There is mild diffuse disease throughout the  vessel.    First Diagonal Branch  There is mild disease in the vessel.    Left Circumflex  There is moderate diffuse disease throughout the vessel.    Right Coronary Artery  There is mild diffuse disease throughout the vessel.    Intervention    No interventions have been documented.    Coronary Diagrams   Diagnostic Dominance: Right  Intervention    Implants    No implant documentation for this case.    [employer]ng[employer] Images    Show images for CARDIAC CATHETERIZATION Images on Long Term Storage    Show images for Mahyar, Vink to Procedure Log   Procedure Log    Hemo Data   Flowsheet Row Most Recent Value  Fick Cardiac Output 8.2 L/min  Fick Cardiac Output Index 4.31 (L/min)/BSA  RA A Wave 4 mmHg  RA V Wave 4 mmHg  RA Mean 3 mmHg  RV Systolic Pressure 32 mmHg  RV Diastolic Pressure 0 mmHg  RV EDP 5 mmHg  PA Systolic Pressure 24 mmHg  PA Diastolic Pressure 10 mmHg  PA Mean 12 mmHg  PW A Wave 13 mmHg  PW V Wave 11 mmHg  PW Mean 8 mmHg  AO Systolic Pressure 115 mmHg  AO Diastolic Pressure 67 mmHg  AO Mean 71 mmHg  QP/QS 0.95  TPVR Index 2.94 HRUI  TSVR Index 16.48 HRUI  PVR SVR Ratio 0.06  TPVR/TSVR Ratio 0.18    ADDENDUM REPORT: 12/29/2022 08:49   ADDENDUM: Extracardiac findings will be described  separately under dictation for contemporaneously obtained CTA chest, abdomen and pelvis dated 12/27/2022. Please see that dictation for full description of relevant extracardiac findings.     Electronically Signed   By: Trudie Reed M.D.   On: 12/29/2022 08:49    Addended by Florencia Reasons, MD on 12/29/2022  8:51 AM    Study Result   Narrative & Impression  CLINICAL DATA:  Aortic valve replacement (TAVR), pre-op eval   EXAM: Cardiac TAVR CT   TECHNIQUE: The patient was scanned on a Siemens Force 192 slice scanner. A 120 kV retrospective scan was triggered in the descending thoracic aorta at 111 HU's. Gantry rotation speed was 270 msecs and collimation was .9 mm. The 3D data set was reconstructed in 5% intervals of the R-R cycle. Systolic and diastolic phases were analyzed on a dedicated work station using MPR, MIP and VRT modes. The patient received 95mL OMNIPAQUE IOHEXOL 350 MG/ML SOLN of contrast.   FINDINGS: Aortic Valve:   Tricuspid aortic valve with severely reduced cusp excursion. Severely thickened and severely calcified aortic valve cusps.   AV calcium score: 2885   Virtual Basal Annulus Measurements:   Maximum/Minimum Diameter: 28.2 x 23.4 mm   Perimeter: 80.5 mm   Area:  504 mm2   Trivial LVOT calcifications.   Membranous septal length: 6 mm   Based on these measurements, the annulus would be suitable for a 26 mm Sapien 3 valve. Alternatively, Heart Team can consider 29 mm Evolut valve. Recommend Heart Team discussion for valve selection.   Sinus of Valsalva Measurements:   Non-coronary:  32 mm   Right - coronary:  31 mm   Left - coronary:  31 mm   Sinus of Valsalva Height:   Left: 19 mm   Right: 21.4 mm   Aorta: Conventional 3 vessel branch pattern of aortic arch. Severe aortic atherosclerosis.   Sinotubular Junction:  32 mm   Ascending Thoracic Aorta:  33 mm  Aortic Arch:  26 mm   Descending Thoracic Aorta:  23 mm    Coronary Artery Height above Annulus: Coronary origins are above the ST junction.   Left main: 21.6 mm   Right coronary: 25 mm   Coronary Arteries: Normal coronary origin. Right dominance. The study was performed without use of NTG and insufficient for plaque evaluation. Coronary artery calcium seen in 3 vessel distribution. Calcium score 3452.   Optimum Fluoroscopic Angle for Delivery: LAO 12, CAU 11   OTHER:   Atria: Left atrial chamber dilation.   Left atrial appendage: No thrombus.   Mitral valve: Grossly normal, mild mitral annular calcifications.   Pulmonary artery: Normal caliber.   Pulmonary veins: Normal variant anatomy (3 on right and 2 on left).   IMPRESSION: 1. 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 Severely thickened and severely calcified aortic valve cusps. 2. Aortic valve calcium score: 2885 3. Annulus area: 504 mm2, suitable for 26 mm Sapien 3 valve. Trivial LVOT calcifications. Membranous septal length 6 mm. 4. Sufficient coronary artery heights from annulus. 5. Optimum fluoroscopic angle for delivery: LAO 12, CAU 11   Electronically Signed: By: Weston Brass M.D. On: 12/27/2022 16:42          Narrative & Impression  CLINICAL DATA:  89 year old male with history of severe aortic stenosis under preprocedural evaluation prior to potential transcatheter aortic valve replacement (TAVR) procedure.   EXAM: CT ANGIOGRAPHY CHEST, ABDOMEN AND PELVIS   TECHNIQUE: Multidetector CT imaging through the chest, abdomen and pelvis was performed using the standard protocol during bolus administration of intravenous contrast. Multiplanar reconstructed images and MIPs were obtained and reviewed to evaluate the vascular anatomy.   RADIATION DOSE REDUCTION: This exam was performed according to the departmental dose-optimization program which includes automated exposure control, adjustment of the mA and/or kV according to patient size  and/or use of iterative reconstruction technique.   CONTRAST:  95mL OMNIPAQUE IOHEXOL 350 MG/ML SOLN   COMPARISON:  PET-CT 05/01/2021.   FINDINGS: CTA CHEST FINDINGS   Cardiovascular: Heart size is normal. There is no significant pericardial fluid, thickening or pericardial calcification. There is aortic atherosclerosis, as well as atherosclerosis of the great vessels of the mediastinum and the coronary arteries, including calcified atherosclerotic plaque in the left main, left anterior descending, left circumflex and right coronary arteries. Severe thickening and calcification of the aortic valve. Mild calcifications of the mitral annulus.   Mediastinum/Lymph Nodes: No pathologically enlarged mediastinal or hilar lymph nodes. Moderate-sized hiatal hernia. No axillary lymphadenopathy.   Lungs/Pleura: No acute consolidative airspace disease. No pleural effusions. No definite suspicious appearing pulmonary nodules or masses are noted. Mild scarring in the lung bases bilaterally.   Musculoskeletal/Soft Tissues: There are no aggressive appearing lytic or blastic lesions noted in the visualized portions of the skeleton.   CTA ABDOMEN AND PELVIS FINDINGS   Hepatobiliary: Small calcified granuloma in segment 2 of the liver incidentally noted. No suspicious cystic or solid hepatic lesions. No intra or extrahepatic biliary ductal dilatation. Gallbladder is unremarkable in appearance.   Pancreas: Small low-attenuation lesions are noted in the distal body and tail of the pancreas, largest of which measures only 9 x 6 mm (axial image 130 of series 6). No solid pancreatic mass. No pancreatic ductal dilatation. No peripancreatic fluid collections or inflammatory changes.   Spleen: Unremarkable.   Adrenals/Urinary Tract: Left-sided extrarenal pelvis (normal anatomical variant) incidentally noted. 1.8 cm exophytic low-attenuation lesion in the posterior aspect of the lower pole of the  right kidney, compatible with a simple (Bosniak class 1) cyst, which requires no imaging follow-up. No hydroureteronephrosis. Small diverticuli noted in the urinary bladder. Urinary bladder is otherwise unremarkable in appearance. Bilateral adrenal glands are normal in appearance.   Stomach/Bowel: The appearance of the stomach is normal. There is no pathologic dilatation of small bowel or colon. Status post right hemicolectomy.   Vascular/Lymphatic: Aortic atherosclerosis, with vascular findings and measurements pertinent to potential TAVR procedure, as detailed below. No lymphadenopathy noted in the abdomen or pelvis.   Reproductive: Fiducial markers in the prostate gland. Prostate gland and seminal vesicles are otherwise unremarkable in appearance.   Other: No significant volume of ascites.  No pneumoperitoneum.   Musculoskeletal: There are no aggressive appearing lytic or blastic lesions noted in the visualized portions of the skeleton.   VASCULAR MEASUREMENTS PERTINENT TO TAVR:   AORTA:   Minimal Aortic Diameter-1.3 x 0.9 mm   Severity of Aortic Calcification-severe   RIGHT PELVIS:   Right Common Iliac Artery -   Minimal Diameter-7.8 x 8.7 mm   Tortuosity-mild   Calcification-moderate   Right External Iliac Artery -   Minimal Diameter-7.6 x 7.1 mm   Tortuosity - mild   Calcification-mild   Right Common Femoral Artery -   Minimal Diameter-6.9 x 8.3 mm   Tortuosity - mild   Calcification-mild   LEFT PELVIS:   Left Common Iliac Artery -   Minimal Diameter-6.0 x 7.2 mm   Tortuosity - mild   Calcification-moderate   Left External Iliac Artery -   Minimal Diameter-5.9 x 6.5 mm   Tortuosity - mild   Calcification-mild   Left Common Femoral Artery -   Minimal Diameter-7.3 x 6.0 mm   Tortuosity - mild   Calcification-mild   Review of the MIP images confirms the above findings.   IMPRESSION: 1. Vascular findings and measurements pertinent  to potential TAVR procedure, as detailed above. 2. Severe thickening and calcification of the aortic valve, compatible with reported clinical history of severe aortic stenosis. 3. Aortic atherosclerosis, in addition to left main and three-vessel coronary artery disease. Please note that although the presence of coronary artery calcium documents the presence of coronary artery disease, the severity of this disease and any potential stenosis cannot be assessed on this non-gated CT examination. Assessment for potential risk factor modification, dietary therapy or pharmacologic therapy may be warranted, if clinically indicated. 4. Moderate-sized hiatal hernia. 5. Low-attenuation lesions in the body and tail of the pancreas measuring up to 9 x 6 mm, nonspecific, but statistically likely benign lesions such as small side branch IPMN (intraductal papillary mucinous neoplasm). Follow-up abdominal MRI with and without IV gadolinium or pancreatic protocol CT scan should be considered in 2 years to ensure the stability of these findings. This recommendation follows ACR consensus guidelines: Management of Incidental Pancreatic Cysts: A White Paper of the ACR Incidental Findings Committee. J Am Coll Radiol 2017;14:911-923.     Electronically Signed   By: Trudie Reed M.D.   On: 12/29/2022 09:42      Impression:   This 89 year old gentleman has stage D, severe, symptomatic aortic stenosis with NYHA class III symptoms of exertional fatigue and shortness of breath as well as exertional chest discomfort and episodes of dizziness and near syncope.  His echocardiogram shows a reduced left ventricular ejection fraction of 35 to 40% with moderate LVH consistent with chronic combined systolic and diastolic congestive heart failure.  His echocardiogram shows a severely calcified and thickened aortic valve with a  mean gradient of 37 mmHg and a peak gradient of 62 mmHg.  Aortic valve area by VTI is 0.95 cm  with a dimensionless index of 0.21 consistent with severe aortic stenosis.  Cardiac catheterization shows mild to moderate diffuse coronary disease with no high-grade stenoses.  I agree that aortic valve replacement is indicated in this patient for relief of his progressive symptoms and to prevent further left ventricular dysfunction.  Given his advanced age I think transcatheter aortic valve replacement would be the best treatment option for him.  His gated cardiac CTA shows anatomy suitable for TAVR using a 26 mm SAPIEN 3 valve.  His abdominal and pelvic CTA shows adequate pelvic vascular anatomy to allow transfemoral insertion.   The patient and his wife were counseled at length regarding treatment alternatives for management of severe symptomatic aortic stenosis. The risks and benefits of surgical intervention has been discussed in detail. Long-term prognosis with medical therapy was discussed. Alternative approaches such as conventional surgical aortic valve replacement, transcatheter aortic valve replacement, and palliative medical therapy were compared and contrasted at length. This discussion was placed in the context of the patient's own specific clinical presentation and past medical history. All of their questions have been addressed.    Following the decision to proceed with transcatheter aortic valve replacement, a discussion was held regarding what types of management strategies would be attempted intraoperatively in the event of life-threatening complications, including whether or not the patient would be considered a candidate for the use of cardiopulmonary bypass and/or conversion to open sternotomy for attempted surgical intervention.  He is still relatively active for his age and I would consider him a candidate for emergent sternotomy to manage any intraoperative complications.  The patient is aware of the fact that transient use of cardiopulmonary bypass may be necessary. The patient has  been advised of a variety of complications that might develop including but not limited to risks of death, stroke, paravalvular leak, aortic dissection or other major vascular complications, aortic annulus rupture, device embolization, cardiac rupture or perforation, mitral regurgitation, acute myocardial infarction, arrhythmia, heart block or bradycardia requiring permanent pacemaker placement, congestive heart failure, respiratory failure, renal failure, pneumonia, infection, other late complications related to structural valve deterioration or migration, or other complications that might ultimately cause a temporary or permanent loss of functional independence or other long term morbidity. The patient provides full informed consent for the procedure as described and all questions were answered.       Plan:   Transfemoral TAVR using a SAPIEN 3 valve.    [name], MD

## 2023-01-17 NOTE — Progress Notes (Signed)
Patient voiced understanding of new arrival time of 6 tomorrow

## 2023-01-18 ENCOUNTER — Inpatient Hospital Stay (HOSPITAL_COMMUNITY)
Admission: RE | Admit: 2023-01-18 | Discharge: 2023-01-19 | DRG: 266 | Disposition: A | Discharge status: Home / Self Care | Payer: Medicare Other | Attending: Internal Medicine | Admitting: Internal Medicine

## 2023-01-18 ENCOUNTER — Inpatient Hospital Stay (HOSPITAL_COMMUNITY): Payer: Medicare Other | Admitting: Vascular Surgery

## 2023-01-18 ENCOUNTER — Inpatient Hospital Stay (HOSPITAL_COMMUNITY): Payer: Medicare Other | Admitting: Certified Registered"

## 2023-01-18 ENCOUNTER — Other Ambulatory Visit: Payer: Self-pay | Admitting: Physician Assistant

## 2023-01-18 ENCOUNTER — Inpatient Hospital Stay (HOSPITAL_COMMUNITY): Payer: Medicare Other

## 2023-01-18 ENCOUNTER — Encounter (HOSPITAL_COMMUNITY): Payer: Self-pay | Admitting: Internal Medicine

## 2023-01-18 ENCOUNTER — Other Ambulatory Visit: Payer: Self-pay

## 2023-01-18 ENCOUNTER — Encounter (HOSPITAL_COMMUNITY)
Admission: RE | Disposition: A | Discharge status: Home / Self Care | Payer: Self-pay | Source: Home / Self Care | Attending: Internal Medicine

## 2023-01-18 DIAGNOSIS — I10 Essential (primary) hypertension: Secondary | ICD-10-CM | POA: Insufficient documentation

## 2023-01-18 DIAGNOSIS — I080 Rheumatic disorders of both mitral and aortic valves: Principal | ICD-10-CM | POA: Diagnosis present

## 2023-01-18 DIAGNOSIS — E785 Hyperlipidemia, unspecified: Secondary | ICD-10-CM | POA: Diagnosis not present

## 2023-01-18 DIAGNOSIS — E1142 Type 2 diabetes mellitus with diabetic polyneuropathy: Secondary | ICD-10-CM | POA: Diagnosis present

## 2023-01-18 DIAGNOSIS — Z952 Presence of prosthetic heart valve: Secondary | ICD-10-CM

## 2023-01-18 DIAGNOSIS — E119 Type 2 diabetes mellitus without complications: Secondary | ICD-10-CM | POA: Diagnosis present

## 2023-01-18 DIAGNOSIS — Z79890 Hormone replacement therapy (postmenopausal): Secondary | ICD-10-CM | POA: Diagnosis not present

## 2023-01-18 DIAGNOSIS — E039 Hypothyroidism, unspecified: Secondary | ICD-10-CM | POA: Diagnosis not present

## 2023-01-18 DIAGNOSIS — I2510 Atherosclerotic heart disease of native coronary artery without angina pectoris: Secondary | ICD-10-CM | POA: Diagnosis present

## 2023-01-18 DIAGNOSIS — E538 Deficiency of other specified B group vitamins: Secondary | ICD-10-CM | POA: Diagnosis present

## 2023-01-18 DIAGNOSIS — I350 Nonrheumatic aortic (valve) stenosis: Secondary | ICD-10-CM

## 2023-01-18 DIAGNOSIS — Z888 Allergy status to other drugs, medicaments and biological substances status: Secondary | ICD-10-CM

## 2023-01-18 DIAGNOSIS — Z85820 Personal history of malignant melanoma of skin: Secondary | ICD-10-CM

## 2023-01-18 DIAGNOSIS — I5043 Acute on chronic combined systolic (congestive) and diastolic (congestive) heart failure: Secondary | ICD-10-CM | POA: Diagnosis not present

## 2023-01-18 DIAGNOSIS — I110 Hypertensive heart disease with heart failure: Secondary | ICD-10-CM | POA: Diagnosis not present

## 2023-01-18 DIAGNOSIS — H409 Unspecified glaucoma: Secondary | ICD-10-CM | POA: Diagnosis present

## 2023-01-18 DIAGNOSIS — Z7982 Long term (current) use of aspirin: Secondary | ICD-10-CM

## 2023-01-18 DIAGNOSIS — I428 Other cardiomyopathies: Secondary | ICD-10-CM

## 2023-01-18 DIAGNOSIS — I447 Left bundle-branch block, unspecified: Secondary | ICD-10-CM | POA: Diagnosis not present

## 2023-01-18 DIAGNOSIS — Z8546 Personal history of malignant neoplasm of prostate: Secondary | ICD-10-CM | POA: Diagnosis not present

## 2023-01-18 DIAGNOSIS — Z006 Encounter for examination for normal comparison and control in clinical research program: Secondary | ICD-10-CM | POA: Diagnosis not present

## 2023-01-18 DIAGNOSIS — Z79899 Other long term (current) drug therapy: Secondary | ICD-10-CM | POA: Diagnosis not present

## 2023-01-18 DIAGNOSIS — I509 Heart failure, unspecified: Secondary | ICD-10-CM

## 2023-01-18 DIAGNOSIS — C61 Malignant neoplasm of prostate: Secondary | ICD-10-CM | POA: Diagnosis present

## 2023-01-18 DIAGNOSIS — D136 Benign neoplasm of pancreas: Secondary | ICD-10-CM | POA: Diagnosis present

## 2023-01-18 DIAGNOSIS — I5021 Acute systolic (congestive) heart failure: Secondary | ICD-10-CM | POA: Insufficient documentation

## 2023-01-18 DIAGNOSIS — Z8249 Family history of ischemic heart disease and other diseases of the circulatory system: Secondary | ICD-10-CM | POA: Diagnosis not present

## 2023-01-18 HISTORY — PX: TRANSCATHETER AORTIC VALVE REPLACEMENT, TRANSFEMORAL: SHX6400

## 2023-01-18 HISTORY — DX: Presence of prosthetic heart valve: Z95.2

## 2023-01-18 HISTORY — DX: Nonrheumatic aortic (valve) stenosis: I35.0

## 2023-01-18 HISTORY — PX: INTRAOPERATIVE TRANSTHORACIC ECHOCARDIOGRAM: SHX6523

## 2023-01-18 LAB — ECHOCARDIOGRAM LIMITED
AR max vel: 3.26 cm2
AV Area VTI: 3.18 cm2
AV Area mean vel: 3.29 cm2
AV Mean grad: 5 mmHg
AV Peak grad: 9.6 mmHg
Ao pk vel: 1.55 m/s
S' Lateral: 4.4 cm

## 2023-01-18 LAB — POCT I-STAT, CHEM 8
BUN: 11 mg/dL (ref 8–23)
BUN: 10 mg/dL (ref 8–23)
BUN: 11 mg/dL (ref 8–23)
Calcium, Ion: 1.22 mmol/L (ref 1.15–1.40)
Calcium, Ion: 1.13 mmol/L — ABNORMAL LOW (ref 1.15–1.40)
Calcium, Ion: 1.22 mmol/L (ref 1.15–1.40)
Chloride: 98 mmol/L (ref 98–111)
Chloride: 100 mmol/L (ref 98–111)
Chloride: 101 mmol/L (ref 98–111)
Creatinine, Ser: 0.8 mg/dL (ref 0.61–1.24)
Creatinine, Ser: 0.6 mg/dL — ABNORMAL LOW (ref 0.61–1.24)
Creatinine, Ser: 0.7 mg/dL (ref 0.61–1.24)
Glucose, Bld: 189 mg/dL — ABNORMAL HIGH (ref 70–99)
Glucose, Bld: 199 mg/dL — ABNORMAL HIGH (ref 70–99)
Glucose, Bld: 206 mg/dL — ABNORMAL HIGH (ref 70–99)
HCT: 31 % — ABNORMAL LOW (ref 39.0–52.0)
HCT: 28 % — ABNORMAL LOW (ref 39.0–52.0)
HCT: 28 % — ABNORMAL LOW (ref 39.0–52.0)
Hemoglobin: 10.5 g/dL — ABNORMAL LOW (ref 13.0–17.0)
Hemoglobin: 9.5 g/dL — ABNORMAL LOW (ref 13.0–17.0)
Hemoglobin: 9.5 g/dL — ABNORMAL LOW (ref 13.0–17.0)
Potassium: 3.5 mmol/L (ref 3.5–5.1)
Potassium: 3.2 mmol/L — ABNORMAL LOW (ref 3.5–5.1)
Potassium: 3.3 mmol/L — ABNORMAL LOW (ref 3.5–5.1)
Sodium: 135 mmol/L (ref 135–145)
Sodium: 135 mmol/L (ref 135–145)
Sodium: 137 mmol/L (ref 135–145)
TCO2: 25 mmol/L (ref 22–32)
TCO2: 23 mmol/L (ref 22–32)
TCO2: 24 mmol/L (ref 22–32)

## 2023-01-18 LAB — GLUCOSE, CAPILLARY: Glucose-Capillary: 149 mg/dL — ABNORMAL HIGH (ref 70–99)

## 2023-01-18 LAB — ABO/RH: ABO/RH(D): A NEG

## 2023-01-18 SURGERY — IMPLANTATION, AORTIC VALVE, TRANSCATHETER, FEMORAL APPROACH
Anesthesia: Monitor Anesthesia Care

## 2023-01-18 MED ORDER — ASPIRIN 81 MG PO TBEC
81.0000 mg | DELAYED_RELEASE_TABLET | Freq: Every day | ORAL | Status: DC
  Administered 2023-01-18 – 2023-01-19 (×2): 81 mg via ORAL
  Filled 2023-01-18 (×2): qty 1

## 2023-01-18 MED ORDER — CEFAZOLIN SODIUM-DEXTROSE 2-4 GM/100ML-% IV SOLN
2.0000 g | Freq: Three times a day (TID) | INTRAVENOUS | Status: AC
  Administered 2023-01-18 (×2): 2 g via INTRAVENOUS
  Filled 2023-01-18 (×2): qty 100

## 2023-01-18 MED ORDER — PROPOFOL 500 MG/50ML IV EMUL
INTRAVENOUS | Status: DC | PRN
  Administered 2023-01-18: 30 ug/kg/min via INTRAVENOUS

## 2023-01-18 MED ORDER — LACTATED RINGERS IV SOLN: INTRAVENOUS | Status: DC

## 2023-01-18 MED ORDER — LIDOCAINE HCL (PF) 1 % IJ SOLN
INTRAMUSCULAR | Status: DC | PRN
  Administered 2023-01-18 (×2): 15 mL

## 2023-01-18 MED ORDER — TRAMADOL HCL 50 MG PO TABS: 50.0000 mg | ORAL_TABLET | ORAL | Status: DC | PRN

## 2023-01-18 MED ORDER — INSULIN ASPART 100 UNIT/ML IJ SOLN: 0.0000 [IU] | INTRAMUSCULAR | Status: DC | PRN

## 2023-01-18 MED ORDER — SODIUM CHLORIDE 0.9% FLUSH
3.0000 mL | Freq: Two times a day (BID) | INTRAVENOUS | Status: DC
  Administered 2023-01-19: 3 mL via INTRAVENOUS

## 2023-01-18 MED ORDER — NITROGLYCERIN IN D5W 200-5 MCG/ML-% IV SOLN: 0.0000 ug/min | INTRAVENOUS | Status: DC

## 2023-01-18 MED ORDER — SODIUM CHLORIDE 0.9 % IV SOLN: INTRAVENOUS | Status: AC

## 2023-01-18 MED ORDER — LOSARTAN POTASSIUM-HCTZ 100-25 MG PO TABS: 1.0000 | ORAL_TABLET | Freq: Every day | ORAL | Status: DC

## 2023-01-18 MED ORDER — ACETAMINOPHEN 325 MG PO TABS: 650.0000 mg | ORAL_TABLET | Freq: Four times a day (QID) | ORAL | Status: DC | PRN

## 2023-01-18 MED ORDER — CHLORHEXIDINE GLUCONATE 4 % EX SOLN
60.0000 mL | Freq: Once | CUTANEOUS | Status: DC
  Filled 2023-01-18: qty 60

## 2023-01-18 MED ORDER — IODIXANOL 320 MG/ML IV SOLN
INTRAVENOUS | Status: DC | PRN
  Administered 2023-01-18: 75 mL

## 2023-01-18 MED ORDER — LEVOTHYROXINE SODIUM 50 MCG PO TABS
50.0000 ug | ORAL_TABLET | Freq: Every day | ORAL | Status: DC
  Administered 2023-01-19: 50 ug via ORAL
  Filled 2023-01-18: qty 1

## 2023-01-18 MED ORDER — LOSARTAN POTASSIUM 50 MG PO TABS
100.0000 mg | ORAL_TABLET | Freq: Every day | ORAL | Status: DC
  Administered 2023-01-19: 100 mg via ORAL
  Filled 2023-01-18: qty 2

## 2023-01-18 MED ORDER — HEPARIN SODIUM (PORCINE) 1000 UNIT/ML IJ SOLN
INTRAMUSCULAR | Status: DC | PRN
  Administered 2023-01-18: 11000 [IU] via INTRAVENOUS

## 2023-01-18 MED ORDER — PROTAMINE SULFATE 10 MG/ML IV SOLN
INTRAVENOUS | Status: DC | PRN
  Administered 2023-01-18: 110 mg via INTRAVENOUS

## 2023-01-18 MED ORDER — HYDROCHLOROTHIAZIDE 25 MG PO TABS
25.0000 mg | ORAL_TABLET | Freq: Every day | ORAL | Status: DC
  Administered 2023-01-19: 25 mg via ORAL
  Filled 2023-01-18: qty 1

## 2023-01-18 MED ORDER — HEPARIN (PORCINE) IN NACL 1000-0.9 UT/500ML-% IV SOLN
INTRAVENOUS | Status: DC | PRN
  Administered 2023-01-18 (×3): 500 mL

## 2023-01-18 MED ORDER — HEPARIN SODIUM (PORCINE) 1000 UNIT/ML IJ SOLN
INTRAMUSCULAR | Status: AC
  Filled 2023-01-18: qty 30

## 2023-01-18 MED ORDER — ONDANSETRON HCL 4 MG/2ML IJ SOLN
INTRAMUSCULAR | Status: DC | PRN
  Administered 2023-01-18: 4 mg via INTRAVENOUS

## 2023-01-18 MED ORDER — PHENYLEPHRINE 80 MCG/ML (10ML) SYRINGE FOR IV PUSH (FOR BLOOD PRESSURE SUPPORT)
PREFILLED_SYRINGE | INTRAVENOUS | Status: DC | PRN
  Administered 2023-01-18: 40 ug via INTRAVENOUS

## 2023-01-18 MED ORDER — ONDANSETRON HCL 4 MG/2ML IJ SOLN: 4.0000 mg | Freq: Four times a day (QID) | INTRAMUSCULAR | Status: DC | PRN

## 2023-01-18 MED ORDER — CHLORHEXIDINE GLUCONATE 4 % EX SOLN
30.0000 mL | CUTANEOUS | Status: DC
  Filled 2023-01-18: qty 30

## 2023-01-18 MED ORDER — LIDOCAINE HCL (PF) 1 % IJ SOLN
INTRAMUSCULAR | Status: AC
  Filled 2023-01-18: qty 30

## 2023-01-18 MED ORDER — OXYCODONE HCL 5 MG PO TABS: 5.0000 mg | ORAL_TABLET | ORAL | Status: DC | PRN

## 2023-01-18 MED ORDER — LATANOPROST 0.005 % OP SOLN
1.0000 [drp] | Freq: Every day | OPHTHALMIC | Status: DC
  Administered 2023-01-18: 1 [drp] via OPHTHALMIC
  Filled 2023-01-18: qty 2.5

## 2023-01-18 MED ORDER — MORPHINE SULFATE (PF) 2 MG/ML IV SOLN: 1.0000 mg | INTRAVENOUS | Status: DC | PRN

## 2023-01-18 MED ORDER — ACETAMINOPHEN 650 MG RE SUPP: 650.0000 mg | Freq: Four times a day (QID) | RECTAL | Status: DC | PRN

## 2023-01-18 MED ORDER — SODIUM CHLORIDE 0.9 % IV SOLN: 250.0000 mL | INTRAVENOUS | Status: DC | PRN

## 2023-01-18 MED ORDER — SODIUM CHLORIDE 0.9 % IV SOLN: INTRAVENOUS | Status: DC

## 2023-01-18 MED ORDER — FUROSEMIDE 10 MG/ML IJ SOLN
20.0000 mg | Freq: Once | INTRAMUSCULAR | Status: AC
  Administered 2023-01-18: 20 mg via INTRAVENOUS
  Filled 2023-01-18: qty 2

## 2023-01-18 MED ORDER — CHLORHEXIDINE GLUCONATE 0.12 % MT SOLN
15.0000 mL | Freq: Once | OROMUCOSAL | Status: AC
  Administered 2023-01-18 (×2): 15 mL via OROMUCOSAL
  Filled 2023-01-18 (×2): qty 15

## 2023-01-18 MED ORDER — SODIUM CHLORIDE 0.9% FLUSH: 3.0000 mL | INTRAVENOUS | Status: DC | PRN

## 2023-01-18 MED ORDER — POTASSIUM CHLORIDE CRYS ER 10 MEQ PO TBCR
10.0000 meq | EXTENDED_RELEASE_TABLET | Freq: Once | ORAL | Status: AC
  Administered 2023-01-18: 10 meq via ORAL
  Filled 2023-01-18: qty 1

## 2023-01-18 SURGICAL SUPPLY — 26 items
BAG SNAP BAND KOVER 36X36 (MISCELLANEOUS) ×4 IMPLANT
CABLE SURGICAL S-101-97-12 (CABLE) IMPLANT
CATH 26 ULTRA DELIVERY (CATHETERS) IMPLANT
CATH DIAG 6FR PIGTAIL ANGLED (CATHETERS) IMPLANT
CATH INFINITI 5FR ANG PIGTAIL (CATHETERS) IMPLANT
CATH INFINITI 6F AL1 (CATHETERS) IMPLANT
CLOSURE PERCLOSE PROSTYLE (VASCULAR PRODUCTS) IMPLANT
CRIMPER (MISCELLANEOUS) IMPLANT
DEVICE INFLATION ATRION QL2530 (MISCELLANEOUS) IMPLANT
ELECT DEFIB PAD ADLT CADENCE (PAD) IMPLANT
KIT HEART LEFT (KITS) ×2 IMPLANT
KIT SAPIAN 3 ULTRA RESILIA 26 (Valve) IMPLANT
PACK CARDIAC CATHETERIZATION (CUSTOM PROCEDURE TRAY) ×2 IMPLANT
SHEATH INTRODUCER SET 20-26 (SHEATH) IMPLANT
SHEATH PINNACLE 6F 10CM (SHEATH) IMPLANT
SHEATH PINNACLE 8F 10CM (SHEATH) IMPLANT
SHEATH PROBE COVER 6X72 (BAG) IMPLANT
STOPCOCK MORSE 400PSI 3WAY (MISCELLANEOUS) ×4 IMPLANT
TRANSDUCER W/STOPCOCK (MISCELLANEOUS) ×4 IMPLANT
TUBING CIL FLEX 10 FLL-RA (TUBING) IMPLANT
WIRE AMPLATZ SS-J .035X180CM (WIRE) IMPLANT
WIRE EMERALD 3MM-J .035X150CM (WIRE) IMPLANT
WIRE EMERALD 3MM-J .035X260CM (WIRE) IMPLANT
WIRE EMERALD ST .035X260CM (WIRE) IMPLANT
WIRE MICRO SET SILHO 5FR 7 (SHEATH) IMPLANT
WIRE SAFARI SM CURVE 275 (WIRE) IMPLANT

## 2023-01-18 NOTE — Op Note (Signed)
[facility]  TAVR OPERATIVE NOTE   Date of Procedure:  01/18/2023  Preoperative Diagnosis: Severe Aortic Stenosis   Postoperative Diagnosis: Same   Procedure:   Transcatheter Aortic Valve Replacement - Transfemoral Approach  Edwards Sapien 3 Resilia THV (size 26 mm, model # 9755RLS, serial # [id number] )   Co-Surgeons:   [name], MD and Alverda Skeans, MD Anesthesiologist:  Achille Rich, MD  Echocardiographer:  Lennie Odor, MD  Pre-operative Echo Findings: Severe aortic stenosis Moderate left ventricular systolic dysfunction  Post-operative Echo Findings: Trace paravalvular leak Moderate left ventricular systolic dysfunction  Left Heart Catheterization Findings: Left ventricular end-diastolic pressure of   BRIEF CLINICAL NOTE AND INDICATIONS FOR SURGERY  The patient is an 89 year old male with a history of prostate cancer, diet-controlled diabetes, hypertension, hypothyroidism, mild obstructive coronary artery disease, nonischemic cardiomyopathy, and severe symptomatic aortic stenosis who is referred for elective transcatheter attic valve replacement from the right transfemoral approach with a 26 mm SAPIEN 3 ultra valve.  During the course of the patient's preoperative work up they have been evaluated comprehensively by a multidisciplinary team of specialists coordinated through the Multid[facility] in the [facility].  They have been demonstrated to suffer from symptomatic severe aortic stenosis as noted above. The patient has been counseled extensively as to the relative risks and benefits of all options for the treatment of severe aortic stenosis including long term medical therapy, conventional surgery for aortic valve replacement, and transcatheter aortic valve replacement.  The patient has been independently evaluated by Dr. Laneta Simmers with CT surgery and they are felt to be at high risk for conventional  surgical aortic valve replacement. The surgeon indicated the patient would be a poor candidate for conventional surgery. Based upon review of all of the patient's preoperative diagnostic tests they are felt to be candidate for transcatheter aortic valve replacement using the transfemoral approach as an alternative to high risk conventional surgery.    Following the decision to proceed with transcatheter aortic valve replacement, a discussion has been held regarding what types of management strategies would be attempted intraoperatively in the event of life-threatening complications, including whether or not the patient would be considered a candidate for the use of cardiopulmonary bypass and/or conversion to open sternotomy for attempted surgical intervention.  The patient has been advised of a variety of complications that might develop peculiar to this approach including but not limited to risks of death, stroke, paravalvular leak, aortic dissection or other major vascular complications, aortic annulus rupture, device embolization, cardiac rupture or perforation, acute myocardial infarction, arrhythmia, heart block or bradycardia requiring permanent pacemaker placement, congestive heart failure, respiratory failure, renal failure, pneumonia, infection, other late complications related to structural valve deterioration or migration, or other complications that might ultimately cause a temporary or permanent loss of functional independence or other long term morbidity.  The patient provides full informed consent for the procedure as described and all questions were answered preoperatively.    DETAILS OF THE OPERATIVE PROCEDURE  PREPARATION:   The patient is brought to the operating room on the above mentioned date and central monitoring was established by the anesthesia team including placement of a radial arterial line. The patient is placed in the supine position on the operating table.  Intravenous  antibiotics are administered. Conscious sedation is used.   Baseline transthoracic echocardiogram was performed. The patient's chest, abdomen, both groins, and both lower extremities are prepared and draped in a sterile  manner. A time out procedure is performed.   PERIPHERAL ACCESS:   Using the modified Seldinger technique, femoral arterial and venous access were obtained with placement of a 6 Fr sheath in the artery and a 7 Fr sheath in the vein on the left and right sides using u/s guidance.  A pigtail diagnostic catheter was passed through the femoral arterial sheath under fluoroscopic guidance into the aortic root.   Aortic root angiography was performed in order to determine the optimal angiographic angle for valve deployment.  TRANSFEMORAL ACCESS:  A micropuncture kit was used to gain access to the right femoral artery using u/s guidance. Position confirmed with angiography. Pre-closure with double ProGlide closure devices. The patient was heparinized systemically and ACT verified > 250 seconds.    A 14 Fr transfemoral E-sheath was introduced into the right femoral artery after progressively dilating over an Amplatz superstiff wire. An AL-1 catheter was used to direct a straight-tip exchange length wire across the native aortic valve into the left ventricle. This was exchanged out for a pigtail catheter and position was confirmed in the LV apex. Simultaneous left ventricular, aortic, and left ventricular end-diastolic pressures were recorded.  The pigtail catheter was then exchanged for an Safari wire in the LV apex.  Direct LV pacing thresholds were assessed and found to be adequate.   TRANSCATHETER HEART VALVE DEPLOYMENT:  An Edwards Sapien 3 THV (size 26 mm) was prepared and crimped per manufacturer's guidelines, and the proper orientation of the valve is confirmed on the [employer] delivery system. The valve was advanced through the introducer sheath using normal technique until in an  appropriate position in the abdominal aorta beyond the sheath tip. The balloon was then retracted and using the fine-tuning wheel was centered on the valve. The valve was then advanced across the aortic arch using appropriate flexion of the catheter. The valve was carefully positioned across the aortic valve annulus. The Commander catheter was retracted using normal technique. Once final position of the valve has been confirmed by angiographic assessment, the valve is deployed while temporarily holding ventilation and during rapid ventricular pacing to maintain systolic blood pressure < 50 mmHg and pulse pressure < 10 mmHg. The balloon inflation is held for >3 seconds after reaching full deployment volume. Once the balloon has fully deflated the balloon is retracted into the ascending aorta and valve function is assessed using TTE. There is felt to be trace paravalvular leak and no central aortic insufficiency.  The patient's hemodynamic recovery following valve deployment is good.  The deployment balloon and guidewire are both removed. Echo demostrated acceptable post-procedural gradients, stable mitral valve function, and no AI.   PROCEDURE COMPLETION:  The sheath was then removed and closure devices were completed. Protamine was administered once femoral arterial repair was complete. The temporary pacemaker, pigtail catheters and femoral sheaths were removed  and manual pressure used for hemostasis.    The patient tolerated the procedure well and is transported to the surgical intensive care in stable condition. There were no immediate intraoperative complications. All sponge instrument and needle counts are verified correct at completion of the operation.   No blood products were administered during the operation.  The patient received a total of 75 mL of intravenous contrast during the procedure.  [name] MD 01/18/2023 9:23 AM

## 2023-01-18 NOTE — Anesthesia Preprocedure Evaluation (Signed)
Anesthesia Evaluation  Patient identified by MRN, date of birth, ID band Patient awake    Reviewed: Allergy & Precautions, H&P , NPO status , Patient's Chart, lab work & pertinent test results  Airway Mallampati: II   Neck ROM: full    Dental   Pulmonary neg pulmonary ROS   breath sounds clear to auscultation       Cardiovascular hypertension, + CAD and +CHF   Rhythm:regular Rate:Normal  TTE (12/14/22): EF 35-40%, moderate AI, severe AS with mean PG 37 mmHg and AVA 0.95 cm2.   Neuro/Psych    GI/Hepatic   Endo/Other  diabetes, Type 2    Renal/GU      Musculoskeletal  (+) Arthritis ,    Abdominal   Peds  Hematology   Anesthesia Other Findings   Reproductive/Obstetrics                             Anesthesia Physical Anesthesia Plan  ASA: 3  Anesthesia Plan: MAC   Post-op Pain Management:    Induction: Intravenous  PONV Risk Score and Plan: 1 and Propofol infusion, Treatment may vary due to age or medical condition and Ondansetron  Airway Management Planned: Simple Face Mask  Additional Equipment:   Intra-op Plan:   Post-operative Plan:   Informed Consent: I have reviewed the patients History and Physical, chart, labs and discussed the procedure including the risks, benefits and alternatives for the proposed anesthesia with the patient or authorized representative who has indicated his/her understanding and acceptance.     Dental advisory given  Plan Discussed with: CRNA, Anesthesiologist and Surgeon  Anesthesia Plan Comments:        Anesthesia Quick Evaluation

## 2023-01-18 NOTE — Anesthesia Postprocedure Evaluation (Signed)
Anesthesia Post Note  Patient: Omar Haley  Procedure(s) Performed: Transcatheter Aortic Valve Replacement, Transfemoral INTRAOPERATIVE TRANSTHORACIC ECHOCARDIOGRAM     Patient location during evaluation: PACU Anesthesia Type: MAC Level of consciousness: awake and alert Pain management: pain level controlled Vital Signs Assessment: post-procedure vital signs reviewed and stable Respiratory status: spontaneous breathing, nonlabored ventilation, respiratory function stable and patient connected to nasal cannula oxygen Cardiovascular status: stable and blood pressure returned to baseline Postop Assessment: no apparent nausea or vomiting Anesthetic complications: no   There were no known notable events for this encounter.  Last Vitals:  Vitals:   01/18/23 0558 01/18/23 0918  BP: (!) 140/74 103/73  Pulse: 99 (!) 58  Resp: 18 12  Temp: 36.4 C 36.7 C  SpO2: 94%     Last Pain:  Vitals:   01/18/23 0918  TempSrc: Temporal  PainSc: 0-No pain                 Betul Brisky S

## 2023-01-18 NOTE — Progress Notes (Signed)
Site area: right and left groin  Site Prior to Removal:  Level 0  Pressure Applied For 10 and 20 MINUTES    Minutes Beginning at 1015  Manual:   Yes.    Patient Status During Pull:  Stable  Post Pull Groin Site:  Level 0  Post Pull Instructions Given:  Yes.    Post Pull Pulses Present:  Yes.    Dressing Applied:  Yes.    Comments:  Bed rest started at 1050 X 4 hr

## 2023-01-18 NOTE — Anesthesia Procedure Notes (Signed)
Procedure Name: MAC Date/Time: 01/18/2023 7:45 AM  Performed by: Macie Burows, CRNAPre-anesthesia Checklist: Patient identified, Emergency Drugs available, Suction available, Patient being monitored and Timeout performed Patient Re-evaluated:Patient Re-evaluated prior to induction Oxygen Delivery Method: Simple face mask Induction Type: IV induction Ventilation: Oral airway inserted - appropriate to patient size Airway Equipment and Method: Oral airway Placement Confirmation: positive ETCO2 and breath sounds checked- equal and bilateral Dental Injury: Teeth and Oropharynx as per pre-operative assessment

## 2023-01-18 NOTE — Transfer of Care (Signed)
Immediate Anesthesia Transfer of Care Note  Patient: Toron Balent  Procedure(s) Performed: Transcatheter Aortic Valve Replacement, Transfemoral INTRAOPERATIVE TRANSTHORACIC ECHOCARDIOGRAM  Patient Location: Cath Lab  Anesthesia Type:MAC  Level of Consciousness: awake, alert , and oriented  Airway & Oxygen Therapy: Patient Spontanous Breathing and Patient connected to nasal cannula oxygen  Post-op Assessment: Report given to RN and Post -op Vital signs reviewed and stable  Post vital signs: Reviewed and stable  Last Vitals:  Vitals Value Taken Time  BP 90/73 01/18/23 0915  Temp    Pulse 60 01/18/23 0917  Resp 15 01/18/23 0917  SpO2 94 % 01/18/23 0917  Vitals shown include unvalidated device data.  Last Pain:  Vitals:   01/18/23 0605  TempSrc:   PainSc: 0-No pain         Complications: There were no known notable events for this encounter.

## 2023-01-18 NOTE — Interval H&P Note (Signed)
History and Physical Interval Note:  01/18/2023 6:22 AM  Omar Haley  has presented today for surgery, with the diagnosis of Severe Aortic Stenosis.  The various methods of treatment have been discussed with the patient and family. After consideration of risks, benefits and other options for treatment, the patient has consented to  Procedure(s): Transcatheter Aortic Valve Replacement, Transfemoral (N/A) INTRAOPERATIVE TRANSTHORACIC ECHOCARDIOGRAM (N/A) as a surgical intervention.  The patient's history has been reviewed, patient examined, no change in status, stable for surgery.  I have reviewed the patient's chart and labs.  Questions were answered to the patient's satisfaction.     [name]

## 2023-01-18 NOTE — Op Note (Signed)
[facility]   MULTIDISCIPLINARY HEART VALVE TEAM   TAVR OPERATIVE NOTE   Date of Procedure:  01/18/2023  Preoperative Diagnosis: Severe Aortic Stenosis   Postoperative Diagnosis: Same   Procedure:   Transcatheter Aortic Valve Replacement - Percutaneous Right Transfemoral Approach  Edwards Sapien 3 Ultra Resilia THV (size 26 mm, model # 9755RSL, serial # [id number])   Co-Surgeons:  [name], MD and Alverda Skeans, MD   Anesthesiologist:  A. Hodierne, MD  Echocardiographer:  W. O'Neal, MD  Pre-operative Echo Findings: Severe aortic stenosis moderate left ventricular systolic dysfunction  Post-operative Echo Findings: Trace paravalvular leak Unchanged moderate left ventricular systolic dysfunction   BRIEF CLINICAL NOTE AND INDICATIONS FOR SURGERY  This 89 year old gentleman has stage D, severe, symptomatic aortic stenosis with NYHA class III symptoms of exertional fatigue and shortness of breath as well as exertional chest discomfort and episodes of dizziness and near syncope.  His echocardiogram shows a reduced left ventricular ejection fraction of 35 to 40% with moderate LVH consistent with chronic combined systolic and diastolic congestive heart failure.  His echocardiogram shows a severely calcified and thickened aortic valve with a mean gradient of 37 mmHg and a peak gradient of 62 mmHg.  Aortic valve area by VTI is 0.95 cm with a dimensionless index of 0.21 consistent with severe aortic stenosis.  Cardiac catheterization shows mild to moderate diffuse coronary disease with no high-grade stenoses.  I agree that aortic valve replacement is indicated in this patient for relief of his progressive symptoms and to prevent further left ventricular dysfunction.  Given his advanced age I think transcatheter aortic valve replacement would be the best treatment option for him.  His gated cardiac CTA shows anatomy suitable for TAVR using a 26 mm SAPIEN 3 valve.  His  abdominal and pelvic CTA shows adequate pelvic vascular anatomy to allow transfemoral insertion.   The patient and his wife were counseled at length regarding treatment alternatives for management of severe symptomatic aortic stenosis. The risks and benefits of surgical intervention has been discussed in detail. Long-term prognosis with medical therapy was discussed. Alternative approaches such as conventional surgical aortic valve replacement, transcatheter aortic valve replacement, and palliative medical therapy were compared and contrasted at length. This discussion was placed in the context of the patient's own specific clinical presentation and past medical history. All of their questions have been addressed.    Following the decision to proceed with transcatheter aortic valve replacement, a discussion was held regarding what types of management strategies would be attempted intraoperatively in the event of life-threatening complications, including whether or not the patient would be considered a candidate for the use of cardiopulmonary bypass and/or conversion to open sternotomy for attempted surgical intervention.  He is still relatively active for his age and I would consider him a candidate for emergent sternotomy to manage any intraoperative complications.  The patient is aware of the fact that transient use of cardiopulmonary bypass may be necessary. The patient has been advised of a variety of complications that might develop including but not limited to risks of death, stroke, paravalvular leak, aortic dissection or other major vascular complications, aortic annulus rupture, device embolization, cardiac rupture or perforation, mitral regurgitation, acute myocardial infarction, arrhythmia, heart block or bradycardia requiring permanent pacemaker placement, congestive heart failure, respiratory failure, renal failure, pneumonia, infection, other late complications related to structural valve  deterioration or migration, or other complications that might ultimately cause a temporary or permanent loss of functional independence or other  long term morbidity. The patient provides full informed consent for the procedure as described and all questions were answered.     DETAILS OF THE OPERATIVE PROCEDURE  PREPARATION:    The patient was brought to the operating room on the above mentioned date and appropriate monitoring was established by the anesthesia team. The patient was placed in the supine position on the operating table.  Intravenous antibiotics were administered. The patient was monitored closely throughout the procedure under conscious sedation.    Baseline transthoracic echocardiogram was performed. The patient's abdomen and both groins were prepped and draped in a sterile manner. A time out procedure was performed.   PERIPHERAL ACCESS:    Using the modified Seldinger technique, femoral arterial and venous access was obtained with placement of 6 Fr sheaths on the left and right sides respectively.  A pigtail diagnostic catheter was passed through the left arterial sheath under fluoroscopic guidance into the aortic root.  Aortic root angiography was performed in order to determine the optimal angiographic angle for valve deployment.   TRANSFEMORAL ACCESS:   Percutaneous transfemoral access and sheath placement was performed using ultrasound guidance.  The right common femoral artery was cannulated using a micropuncture needle and appropriate location was verified using hand injection angiogram.  A pair of Abbott Perclose percutaneous closure devices were placed and a 6 French sheath replaced into the femoral artery.  The patient was heparinized systemically and ACT verified > 250 seconds.    A 14 Fr transfemoral E-sheath was introduced into the right common femoral artery after progressively dilating over an Amplatz superstiff wire. An AL-1 catheter was used to direct a  straight-tip exchange length wire across the native aortic valve into the left ventricle. This was exchanged out for a pigtail catheter and position was confirmed in the LV apex. Simultaneous LV and Ao pressures were recorded.  The pigtail catheter was exchanged for a Safari wire in the LV apex. Direct LV pacing threshold through the [facility] wire was tested and satisfactory.  BALLOON AORTIC VALVULOPLASTY:   Not performed   TRANSCATHETER HEART VALVE DEPLOYMENT:   An Edwards Sapien 3 Ultra transcatheter heart valve (size 26 mm) was prepared and crimped per manufacturer's guidelines, and the proper orientation of the valve is confirmed on the [employer] delivery system. The valve was advanced through the introducer sheath using normal technique until in an appropriate position in the abdominal aorta beyond the sheath tip. The balloon was then retracted and using the fine-tuning wheel was centered on the valve. The valve was then advanced across the aortic arch using appropriate flexion of the catheter. The valve was carefully positioned across the aortic valve annulus. The Commander catheter was retracted using normal technique. Once final position of the valve has been confirmed by angiographic assessment, the valve is deployed during rapid ventricular pacing to maintain systolic blood pressure < 50 mmHg and pulse pressure < 10 mmHg. The balloon inflation is held for >3 seconds after reaching full deployment volume. Once the balloon has fully deflated the balloon is retracted into the ascending aorta and valve function is assessed using echocardiography. There is felt to be trace paravalvular leak and no central aortic insufficiency.  The patient's hemodynamic recovery following valve deployment is good.  The deployment balloon and guidewire are both removed.    PROCEDURE COMPLETION:   The sheath was removed and femoral artery closure performed.  Protamine was administered once femoral arterial  repair was complete. The pigtail catheter and femoral sheaths were  removed with manual pressure used for venous hemostasis.    The patient tolerated the procedure well and is transported to the cath lab recovery area in stable condition. There were no immediate intraoperative complications. All sponge instrument and needle counts are verified correct at completion of the operation.   No blood products were administered during the operation.  The patient received a total of 75 mL of intravenous contrast during the procedure.   [name], MD 01/18/2023

## 2023-01-18 NOTE — Discharge Summary (Incomplete)
[facility]   MULTIDISCIPLINARY HEART VALVE TEAM  Discharge Summary    Patient ID: Omar Haley MRN: [id number]; DOB: August 07, 1931  Admit date: 01/18/2023 Discharge date: 01/19/2023  Primary Care Provider: Alysia Penna, MD  Primary Cardiologist: [name], MD   Discharge Diagnoses    Principal Problem:   S/P TAVR (transcatheter aortic valve replacement) Active Problems:   Prostate cancer (HCC)   HTN (hypertension)   Hypothyroidism   HLD (hyperlipidemia)   Severe aortic stenosis   LBBB (left bundle branch block)   Nonischemic cardiomyopathy (HCC)   Acute HFrEF (heart failure with reduced ejection fraction) (HCC)   Allergies Allergies  Allergen Reactions   Enalapril     cough   Gemfibrozil Other (See Comments)    Unknown   Micardis [Telmisartan]     Diarrhea    Zetia [Ezetimibe]     laryngitis    Diagnostic Studies/Procedures    [facility]  TAVR OPERATIVE NOTE     Date of Procedure:                01/18/2023   Preoperative Diagnosis:      Severe Aortic Stenosis    Postoperative Diagnosis:    Same    Procedure:        Transcatheter Aortic Valve Replacement - Transfemoral Approach             Edwards Sapien 3 Resilia THV (size 26 mm, model # 9755RLS, serial # [id number] )              Co-Surgeons:                         [name], MD and Alverda Skeans, MD Anesthesiologist:                  Achille Rich, MD   Echocardiographer:              Lennie Odor, MD   Pre-operative Echo Findings: Severe aortic stenosis Moderate left ventricular systolic dysfunction   Post-operative Echo Findings: Trace paravalvular leak Moderate left ventricular systolic dysfunction   Left Heart Catheterization Findings: Left ventricular end-diastolic pressure of  _____________    Echo 01/19/23: completed but pending formal read at the time of discharge   History of Present Illness     Omar Haley is a 89 y.o. male with  a history of prostate cancer, diet-controlled diabetes, HTN, hypothyroidism, HLD, LV dysfunction and severe aortic stenosis who presented to [facility] on 01/18/23 for planned TAVR.   He had a presyncopal episode when walking to the mailbox which prompted cardiology referral. He reported progressive exertional fatigue as well as shortness of breath and chest pain. Echocardiogram on 12/14/2022 showed LV dysfunction (EF 35-40%) and severe AS with mean grad 37 mmHg, peak grad 62.1 mmHg, AVA 0.95 cm2, DVI 0.21, SVI 43 as well as moderate AI/MR. [facility] 01/06/23 showed mild to moderate diffuse nonobstructive coronary artery disease.  The patient was evaluated by the multidisciplinary valve team and felt to have severe, symptomatic aortic stenosis and to be a suitable candidate for TAVR, which was set up for 01/18/23.   Hospital Course     Consultants: none   Severe AS: s/p successful TAVR with a 26 mm Edwards Sapien 3 THV via the TF approach on 01/18/23. Post operative echo completed but pending formal read. Groin sites are stable. ECG with sinus/old LBBB and no high grade heart block.  Continue Asprin 81mg  daily alone. Plan for discharge home today with close follow up in the outpatient setting.   Acute HFrEF: as evidenced by an elevated LVEDP at the time of TAVR and mild LE edema. He was treated with one dose of IV Lasix 20mg /Kdur . LE edema improved. Appears euvolemic at the time of discharge. Resume home Losartan-HCTZ.   HTN: BP well controlled. Resumed on home meds.   Pancreas lesion: pre TAVR CT showed a "low-attenuation lesions in the body and tail of the pancreas measuring up to 9 x 6 mm, nonspecific, but statistically likely benign lesions such as small side branch IPMN (intraductal papillary mucinous neoplasm). Follow-up abdominal MRI with and without IV gadolinium or pancreatic protocol CT scan should be considered in 2 years to ensure the stability of these findings. This will be discussed in the outpatient  setting.  _____________  Discharge Vitals Blood pressure (!) 124/48, pulse 61, temperature 98.3 F (36.8 C), temperature source Oral, resp. rate 18, height 5\' 10"  (1.778 m), weight 70.2 kg, SpO2 90 %.  Filed Weights   01/18/23 0558 01/19/23 0500  Weight: 72.6 kg 70.2 kg     GEN: Well nourished, well developed, in no acute distress HEENT: normal Neck: no JVD or masses Cardiac: RRR; no murmurs, rubs, or gallops,no edema  Respiratory:  clear to auscultation bilaterally, normal work of breathing GI: soft, nontender, nondistended, + BS MS: no deformity or atrophy Skin: warm and dry, no rash.  Groin sites clear without hematoma or ecchymosis  Neuro:  Alert and Oriented x 3, Strength and sensation are intact Psych: euthymic mood, full affect   Labs & Radiologic Studies    CBC Recent Labs    01/18/23 0924 01/19/23 0124  WBC  --  7.1  HGB 10.5* 11.6*  HCT 31.0* 33.7*  MCV  --  84.7  PLT  --  140*   Basic Metabolic Panel Recent Labs    04/54/09 0924 01/19/23 0124  NA 135 133*  K 3.5 3.5  CL 98 100  CO2  --  24  GLUCOSE 189* 159*  BUN 11 15  CREATININE 0.80 1.10  CALCIUM  --  8.5*  MG  --  1.7   Liver Function Tests No results for input(s): "AST", "ALT", "ALKPHOS", "BILITOT", "PROT", "ALBUMIN" in the last 72 hours. No results for input(s): "LIPASE", "AMYLASE" in the last 72 hours. Cardiac Enzymes No results for input(s): "CKTOTAL", "CKMB", "CKMBINDEX", "TROPONINI" in the last 72 hours. BNP Invalid input(s): "POCBNP" D-Dimer No results for input(s): "DDIMER" in the last 72 hours. Hemoglobin A1C No results for input(s): "HGBA1C" in the last 72 hours. Fasting Lipid Panel No results for input(s): "CHOL", "HDL", "LDLCALC", "TRIG", "CHOLHDL", "LDLDIRECT" in the last 72 hours. Thyroid Function Tests No results for input(s): "TSH", "T4TOTAL", "T3FREE", "THYROIDAB" in the last 72 hours.  Invalid input(s): "FREET3" _____________  DG Chest 2 View  Result Date:  01/18/2023 CLINICAL DATA:  Non rheumatic aortic valve stenosis.  Preop. EXAM: CHEST - 2 VIEW COMPARISON:  CT 12/27/2022 FINDINGS: The heart is normal in size.The cardiomediastinal contours are normal. Aortic atherosclerosis. The lungs are clear. There is subtle septal thickening at the lung bases. Trace fissural fluid but no significant sub pulmonic effusion. No focal airspace disease. No acute osseous abnormalities are seen. IMPRESSION: 1. Mild septal thickening at the lung bases, may reflect pulmonary edema. Trace fissural fluid. 2. Aortic atherosclerosis. Electronically Signed   By: Narda Rutherford M.D.   On: 01/18/2023 11:03  ECHOCARDIOGRAM LIMITED  Result Date: 01/18/2023    ECHOCARDIOGRAM LIMITED REPORT   Patient Name:   JAMARE TRIVINO Date of Exam: 01/18/2023 Medical Rec #:  [id number]     Height:       70.0 in Accession #:    [id number]    Weight:       160.0 lb Date of Birth:  January 09, 1931    BSA:          1.898 m Patient Age:    89 years      BP:           140/74 mmHg Patient Gender: M             HR:           93 bpm. Exam Location:  Inpatient Procedure: Limited Echo, Cardiac Doppler and Color Doppler Indications:     I35.0 Nonrheumatic aortic (valve) stenosis  History:         Patient has prior history of Echocardiogram examinations, most                  recent 12/14/2022. Abnormal ECG, Aortic Valve Disease,                  Arrythmias:LBBB, Signs/Symptoms:Murmur,                  Dizziness/Lightheadedness and Chest Pain; Risk                  Factors:Hypertension and Dyslipidemia. Cancer. Severe aortic                  stenosis.                  Aortic Valve: 26 mm Sapien prosthetic, stented (TAVR) valve is                  present in the aortic position. Procedure Date: 01/18/2023.  Sonographer:     Sheralyn Boatman RDCS Referring Phys:  [id number] Charlies Constable [facility] Diagnosing Phys: Lennie Odor MD IMPRESSIONS  1. TTE guided TAVR. 26 mm S3 deployed. Trivial paravalvular leak noted after (could be just related to the  guide wire). Vmax 1.5 m/s, MG 5 mmHG, EOA 3.18 cm2, DI 0.65. Normal prosthesis.  2. There is a 26 mm Sapien prosthetic (TAVR) valve present in the aortic position. Procedure Date: 01/18/2023. Echo findings are consistent with normal structure and function of the aortic valve prosthesis. Echo findings are consistent with trivial perivalvular leak of the aortic prosthesis.  3. Left ventricular ejection fraction, by estimation, is 35 to 40%. The left ventricle has moderately decreased function. The left ventricle demonstrates global hypokinesis. There is mild asymmetric left ventricular hypertrophy of the basal-septal segment.  4. Right ventricular systolic function is normal. The right ventricular size is normal.  5. The mitral valve is degenerative. Mild mitral valve regurgitation. No evidence of mitral stenosis. FINDINGS  Left Ventricle: Left ventricular ejection fraction, by estimation, is 35 to 40%. The left ventricle has moderately decreased function. The left ventricle demonstrates global hypokinesis. The left ventricular internal cavity size was normal in size. There is mild asymmetric left ventricular hypertrophy of the basal-septal segment. Abnormal (paradoxical) septal motion, consistent with left bundle branch block. Right Ventricle: The right ventricular size is normal. No increase in right ventricular wall thickness. Right ventricular systolic function is normal. Pericardium: There is no evidence of pericardial effusion. Mitral Valve: The mitral valve is degenerative in appearance. Mild mitral valve regurgitation.  No evidence of mitral valve stenosis. Tricuspid Valve: The tricuspid valve is grossly normal. Tricuspid valve regurgitation is trivial. No evidence of tricuspid stenosis. Aortic Valve: The aortic valve has been repaired/replaced. Aortic valve mean gradient measures 5.0 mmHg. Aortic valve peak gradient measures 9.6 mmHg. Aortic valve area, by VTI measures 3.18 cm. There is a 26 mm Sapien  prosthetic, stented (TAVR) valve present in the aortic position. Procedure Date: 01/18/2023. Echo findings are consistent with normal structure and function of the aortic valve prosthesis. Aorta: The aortic root and ascending aorta are structurally normal, with no evidence of dilitation. Additional Comments: Spectral Doppler performed. Color Doppler performed.  LEFT VENTRICLE PLAX 2D LVIDd:         5.30 cm LVIDs:         4.40 cm LV PW:         1.00 cm LV IVS:        1.40 cm LVOT diam:     2.50 cm LV SV:         109 LV SV Index:   57 LVOT Area:     4.91 cm  AORTIC VALVE AV Area (Vmax):    3.26 cm AV Area (Vmean):   3.29 cm AV Area (VTI):     3.18 cm AV Vmax:           155.00 cm/s AV Vmean:          104.000 cm/s AV VTI:            0.343 m AV Peak Grad:      9.6 mmHg AV Mean Grad:      5.0 mmHg LVOT Vmax:         103.00 cm/s LVOT Vmean:        69.700 cm/s LVOT VTI:          0.222 m LVOT/AV VTI ratio: 0.65  SHUNTS Systemic VTI:  0.22 m Systemic Diam: 2.50 cm Lennie Odor MD Electronically signed by Lennie Odor MD Signature Date/Time: 01/18/2023/9:39:48 AM    Final    Structural Heart Procedure  Result Date: 01/18/2023 See surgical note for result.  CARDIAC CATHETERIZATION  Result Date: 01/06/2023 1.  Mild to moderate diffuse obstructive coronary artery disease with no high-grade obstructions. 2.  Mean RA pressure of 3, RV pressure of 32/0 with RV end-diastolic pressure of 5 mmHg, mean wedge pressure of 6 mmHg, and mean PA pressure of 12 mmHg with a Fick cardiac output of 8.2 L/min and Fick cardiac index of 4.3 L/min/m. Recommendation: Continue evaluation for aortic valve intervention.   CT ANGIO ABDOMEN PELVIS  W &/OR WO CONTRAST  Result Date: 12/29/2022 CLINICAL DATA:  89 year old male with history of severe aortic stenosis under preprocedural evaluation prior to potential transcatheter aortic valve replacement (TAVR) procedure. EXAM: CT ANGIOGRAPHY CHEST, ABDOMEN AND PELVIS TECHNIQUE: Multidetector CT  imaging through the chest, abdomen and pelvis was performed using the standard protocol during bolus administration of intravenous contrast. Multiplanar reconstructed images and MIPs were obtained and reviewed to evaluate the vascular anatomy. RADIATION DOSE REDUCTION: This exam was performed according to the departmental dose-optimization program which includes automated exposure control, adjustment of the mA and/or kV according to patient size and/or use of iterative reconstruction technique. CONTRAST:  95mL OMNIPAQUE IOHEXOL 350 MG/ML SOLN COMPARISON:  PET-CT 05/01/2021. FINDINGS: CTA CHEST FINDINGS Cardiovascular: Heart size is normal. There is no significant pericardial fluid, thickening or pericardial calcification. There is aortic atherosclerosis, as well as atherosclerosis of the great vessels of the  mediastinum and the coronary arteries, including calcified atherosclerotic plaque in the left main, left anterior descending, left circumflex and right coronary arteries. Severe thickening and calcification of the aortic valve. Mild calcifications of the mitral annulus. Mediastinum/Lymph Nodes: No pathologically enlarged mediastinal or hilar lymph nodes. Moderate-sized hiatal hernia. No axillary lymphadenopathy. Lungs/Pleura: No acute consolidative airspace disease. No pleural effusions. No definite suspicious appearing pulmonary nodules or masses are noted. Mild scarring in the lung bases bilaterally. Musculoskeletal/Soft Tissues: There are no aggressive appearing lytic or blastic lesions noted in the visualized portions of the skeleton. CTA ABDOMEN AND PELVIS FINDINGS Hepatobiliary: Small calcified granuloma in segment 2 of the liver incidentally noted. No suspicious cystic or solid hepatic lesions. No intra or extrahepatic biliary ductal dilatation. Gallbladder is unremarkable in appearance. Pancreas: Small low-attenuation lesions are noted in the distal body and tail of the pancreas, largest of which  measures only 9 x 6 mm (axial image 130 of series 6). No solid pancreatic mass. No pancreatic ductal dilatation. No peripancreatic fluid collections or inflammatory changes. Spleen: Unremarkable. Adrenals/Urinary Tract: Left-sided extrarenal pelvis (normal anatomical variant) incidentally noted. 1.8 cm exophytic low-attenuation lesion in the posterior aspect of the lower pole of the right kidney, compatible with a simple (Bosniak class 1) cyst, which requires no imaging follow-up. No hydroureteronephrosis. Small diverticuli noted in the urinary bladder. Urinary bladder is otherwise unremarkable in appearance. Bilateral adrenal glands are normal in appearance. Stomach/Bowel: The appearance of the stomach is normal. There is no pathologic dilatation of small bowel or colon. Status post right hemicolectomy. Vascular/Lymphatic: Aortic atherosclerosis, with vascular findings and measurements pertinent to potential TAVR procedure, as detailed below. No lymphadenopathy noted in the abdomen or pelvis. Reproductive: Fiducial markers in the prostate gland. Prostate gland and seminal vesicles are otherwise unremarkable in appearance. Other: No significant volume of ascites.  No pneumoperitoneum. Musculoskeletal: There are no aggressive appearing lytic or blastic lesions noted in the visualized portions of the skeleton. VASCULAR MEASUREMENTS PERTINENT TO TAVR: AORTA: Minimal Aortic Diameter-1.3 x 0.9 mm Severity of Aortic Calcification-severe RIGHT PELVIS: Right Common Iliac Artery - Minimal Diameter-7.8 x 8.7 mm Tortuosity-mild Calcification-moderate Right External Iliac Artery - Minimal Diameter-7.6 x 7.1 mm Tortuosity - mild Calcification-mild Right Common Femoral Artery - Minimal Diameter-6.9 x 8.3 mm Tortuosity - mild Calcification-mild LEFT PELVIS: Left Common Iliac Artery - Minimal Diameter-6.0 x 7.2 mm Tortuosity - mild Calcification-moderate Left External Iliac Artery - Minimal Diameter-5.9 x 6.5 mm Tortuosity - mild  Calcification-mild Left Common Femoral Artery - Minimal Diameter-7.3 x 6.0 mm Tortuosity - mild Calcification-mild Review of the MIP images confirms the above findings. IMPRESSION: 1. Vascular findings and measurements pertinent to potential TAVR procedure, as detailed above. 2. Severe thickening and calcification of the aortic valve, compatible with reported clinical history of severe aortic stenosis. 3. Aortic atherosclerosis, in addition to left main and three-vessel coronary artery disease. Please note that although the presence of coronary artery calcium documents the presence of coronary artery disease, the severity of this disease and any potential stenosis cannot be assessed on this non-gated CT examination. Assessment for potential risk factor modification, dietary therapy or pharmacologic therapy may be warranted, if clinically indicated. 4. Moderate-sized hiatal hernia. 5. Low-attenuation lesions in the body and tail of the pancreas measuring up to 9 x 6 mm, nonspecific, but statistically likely benign lesions such as small side branch IPMN (intraductal papillary mucinous neoplasm). Follow-up abdominal MRI with and without IV gadolinium or pancreatic protocol CT scan should be considered in 2 years to  ensure the stability of these findings. This recommendation follows ACR consensus guidelines: Management of Incidental Pancreatic Cysts: A White Paper of the ACR Incidental Findings Committee. J Am Coll Radiol 2017;14:911-923. Electronically Signed   By: Trudie Reed M.D.   On: 12/29/2022 09:42   CT ANGIO CHEST AORTA W/CM & OR WO/CM  Result Date: 12/29/2022 CLINICAL DATA:  89 year old male with history of severe aortic stenosis under preprocedural evaluation prior to potential transcatheter aortic valve replacement (TAVR) procedure. EXAM: CT ANGIOGRAPHY CHEST, ABDOMEN AND PELVIS TECHNIQUE: Multidetector CT imaging through the chest, abdomen and pelvis was performed using the standard protocol during  bolus administration of intravenous contrast. Multiplanar reconstructed images and MIPs were obtained and reviewed to evaluate the vascular anatomy. RADIATION DOSE REDUCTION: This exam was performed according to the departmental dose-optimization program which includes automated exposure control, adjustment of the mA and/or kV according to patient size and/or use of iterative reconstruction technique. CONTRAST:  95mL OMNIPAQUE IOHEXOL 350 MG/ML SOLN COMPARISON:  PET-CT 05/01/2021. FINDINGS: CTA CHEST FINDINGS Cardiovascular: Heart size is normal. There is no significant pericardial fluid, thickening or pericardial calcification. There is aortic atherosclerosis, as well as atherosclerosis of the great vessels of the mediastinum and the coronary arteries, including calcified atherosclerotic plaque in the left main, left anterior descending, left circumflex and right coronary arteries. Severe thickening and calcification of the aortic valve. Mild calcifications of the mitral annulus. Mediastinum/Lymph Nodes: No pathologically enlarged mediastinal or hilar lymph nodes. Moderate-sized hiatal hernia. No axillary lymphadenopathy. Lungs/Pleura: No acute consolidative airspace disease. No pleural effusions. No definite suspicious appearing pulmonary nodules or masses are noted. Mild scarring in the lung bases bilaterally. Musculoskeletal/Soft Tissues: There are no aggressive appearing lytic or blastic lesions noted in the visualized portions of the skeleton. CTA ABDOMEN AND PELVIS FINDINGS Hepatobiliary: Small calcified granuloma in segment 2 of the liver incidentally noted. No suspicious cystic or solid hepatic lesions. No intra or extrahepatic biliary ductal dilatation. Gallbladder is unremarkable in appearance. Pancreas: Small low-attenuation lesions are noted in the distal body and tail of the pancreas, largest of which measures only 9 x 6 mm (axial image 130 of series 6). No solid pancreatic mass. No pancreatic ductal  dilatation. No peripancreatic fluid collections or inflammatory changes. Spleen: Unremarkable. Adrenals/Urinary Tract: Left-sided extrarenal pelvis (normal anatomical variant) incidentally noted. 1.8 cm exophytic low-attenuation lesion in the posterior aspect of the lower pole of the right kidney, compatible with a simple (Bosniak class 1) cyst, which requires no imaging follow-up. No hydroureteronephrosis. Small diverticuli noted in the urinary bladder. Urinary bladder is otherwise unremarkable in appearance. Bilateral adrenal glands are normal in appearance. Stomach/Bowel: The appearance of the stomach is normal. There is no pathologic dilatation of small bowel or colon. Status post right hemicolectomy. Vascular/Lymphatic: Aortic atherosclerosis, with vascular findings and measurements pertinent to potential TAVR procedure, as detailed below. No lymphadenopathy noted in the abdomen or pelvis. Reproductive: Fiducial markers in the prostate gland. Prostate gland and seminal vesicles are otherwise unremarkable in appearance. Other: No significant volume of ascites.  No pneumoperitoneum. Musculoskeletal: There are no aggressive appearing lytic or blastic lesions noted in the visualized portions of the skeleton. VASCULAR MEASUREMENTS PERTINENT TO TAVR: AORTA: Minimal Aortic Diameter-1.3 x 0.9 mm Severity of Aortic Calcification-severe RIGHT PELVIS: Right Common Iliac Artery - Minimal Diameter-7.8 x 8.7 mm Tortuosity-mild Calcification-moderate Right External Iliac Artery - Minimal Diameter-7.6 x 7.1 mm Tortuosity - mild Calcification-mild Right Common Femoral Artery - Minimal Diameter-6.9 x 8.3 mm Tortuosity - mild Calcification-mild LEFT PELVIS: Left  Common Iliac Artery - Minimal Diameter-6.0 x 7.2 mm Tortuosity - mild Calcification-moderate Left External Iliac Artery - Minimal Diameter-5.9 x 6.5 mm Tortuosity - mild Calcification-mild Left Common Femoral Artery - Minimal Diameter-7.3 x 6.0 mm Tortuosity - mild  Calcification-mild Review of the MIP images confirms the above findings. IMPRESSION: 1. Vascular findings and measurements pertinent to potential TAVR procedure, as detailed above. 2. Severe thickening and calcification of the aortic valve, compatible with reported clinical history of severe aortic stenosis. 3. Aortic atherosclerosis, in addition to left main and three-vessel coronary artery disease. Please note that although the presence of coronary artery calcium documents the presence of coronary artery disease, the severity of this disease and any potential stenosis cannot be assessed on this non-gated CT examination. Assessment for potential risk factor modification, dietary therapy or pharmacologic therapy may be warranted, if clinically indicated. 4. Moderate-sized hiatal hernia. 5. Low-attenuation lesions in the body and tail of the pancreas measuring up to 9 x 6 mm, nonspecific, but statistically likely benign lesions such as small side branch IPMN (intraductal papillary mucinous neoplasm). Follow-up abdominal MRI with and without IV gadolinium or pancreatic protocol CT scan should be considered in 2 years to ensure the stability of these findings. This recommendation follows ACR consensus guidelines: Management of Incidental Pancreatic Cysts: A White Paper of the ACR Incidental Findings Committee. J Am Coll Radiol 2017;14:911-923. Electronically Signed   By: Trudie Reed M.D.   On: 12/29/2022 09:42   CT CORONARY MORPH W/CTA COR W/SCORE W/CA W/CM &/OR WO/CM  Addendum Date: 12/29/2022   ADDENDUM REPORT: 12/29/2022 08:49 ADDENDUM: Extracardiac findings will be described separately under dictation for contemporaneously obtained CTA chest, abdomen and pelvis dated 12/27/2022. Please see that dictation for full description of relevant extracardiac findings. Electronically Signed   By: Trudie Reed M.D.   On: 12/29/2022 08:49   Result Date: 12/29/2022 CLINICAL DATA:  Aortic valve replacement (TAVR),  pre-op eval EXAM: Cardiac TAVR CT TECHNIQUE: The patient was scanned on a Siemens Force 192 slice scanner. A 120 kV retrospective scan was triggered in the descending thoracic aorta at 111 HU's. Gantry rotation speed was 270 msecs and collimation was .9 mm. The 3D data set was reconstructed in 5% intervals of the R-R cycle. Systolic and diastolic phases were analyzed on a dedicated work station using MPR, MIP and VRT modes. The patient received 95mL OMNIPAQUE IOHEXOL 350 MG/ML SOLN of contrast. FINDINGS: Aortic Valve: Tricuspid aortic valve with severely reduced cusp excursion. Severely thickened and severely calcified aortic valve cusps. AV calcium score: 2885 Virtual Basal Annulus Measurements: Maximum/Minimum Diameter: 28.2 x 23.4 mm Perimeter: 80.5 mm Area:  504 mm2 Trivial LVOT calcifications. Membranous septal length: 6 mm Based on these measurements, the annulus would be suitable for a 26 mm Sapien 3 valve. Alternatively, Heart Team can consider 29 mm Evolut valve. Recommend Heart Team discussion for valve selection. Sinus of Valsalva Measurements: Non-coronary:  32 mm Right - coronary:  31 mm Left - coronary:  31 mm Sinus of Valsalva Height: Left: 19 mm Right: 21.4 mm Aorta: Conventional 3 vessel branch pattern of aortic arch. Severe aortic atherosclerosis. Sinotubular Junction:  32 mm Ascending Thoracic Aorta:  33 mm Aortic Arch:  26 mm Descending Thoracic Aorta:  23 mm Coronary Artery Height above Annulus: Coronary origins are above the ST junction. Left main: 21.6 mm Right coronary: 25 mm Coronary Arteries: Normal coronary origin. Right dominance. The study was performed without use of NTG and insufficient for plaque evaluation. Coronary artery  calcium seen in 3 vessel distribution. Calcium score 3452. Optimum Fluoroscopic Angle for Delivery: LAO 12, CAU 11 OTHER: Atria: Left atrial chamber dilation. Left atrial appendage: No thrombus. Mitral valve: Grossly normal, mild mitral annular calcifications.  Pulmonary artery: Normal caliber. Pulmonary veins: Normal variant anatomy (3 on right and 2 on left). IMPRESSION: 1. 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 Severely thickened and severely calcified aortic valve cusps. 2. Aortic valve calcium score: 2885 3. Annulus area: 504 mm2, suitable for 26 mm Sapien 3 valve. Trivial LVOT calcifications. Membranous septal length 6 mm. 4. Sufficient coronary artery heights from annulus. 5. Optimum fluoroscopic angle for delivery: LAO 12, CAU 11 Electronically Signed: By: Weston Brass M.D. On: 12/27/2022 16:42   Disposition   Pt is being discharged home today in good condition.  Follow-up Plans & Appointments     Follow-up Information     Janetta Hora, PA-C Follow up on 01/26/2023.   Specialties: Cardiology, Radiology Why: @ 1:55pm. Please arrive at least 10 minutes early. Contact information: [address] [place] [address] [phone]                  Discharge Medications   Allergies as of 01/19/2023       Reactions   Enalapril    cough   Gemfibrozil Other (See Comments)   Unknown   Micardis [telmisartan]    Diarrhea   Zetia [ezetimibe]    laryngitis        Medication List     TAKE these medications    acetaminophen 650 MG CR tablet Commonly known as: TYLENOL Take 650 mg by mouth every 8 (eight) hours as needed for pain.   Aspirin 81 MG EC tablet Take 81 mg by mouth daily.   ibuprofen 200 MG tablet Commonly known as: ADVIL Take 200 mg by mouth every 4 (four) hours as needed for pain, mild pain or moderate pain.   latanoprost 0.005 % ophthalmic solution Commonly known as: XALATAN Place 1 drop into both eyes at bedtime.   levothyroxine 50 MCG tablet Commonly known as: SYNTHROID Take 50 mcg by mouth daily before breakfast.   losartan-hydrochlorothiazide 100-25 MG tablet Commonly known as: HYZAAR Take 1 tablet by mouth daily.         Outstanding  Labs/Studies   none  Duration of Discharge Encounter   Greater than 30 minutes including physician time.  Signed, [name], PA-C 01/19/2023, 9:20 AM [phone]   ATTENDING ATTESTATION:  After conducting a review of all available clinical information with the care team, interviewing the patient, and performing a physical exam, I agree with the findings and plan described in this note.   GEN: No acute distress.   HEENT:  MMM, no JVD, no scleral icterus Cardiac: RRR, no murmurs, rubs, or gallops.  Respiratory: Clear to auscultation bilaterally. GI: Soft, nontender, non-distended  MS: No edema; No deformity. 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  His conduction remains intact (chronic LBBB with unchanged QRS).  D/C today with close hospital follow up.  Alverda Skeans, MD Pager [phone]

## 2023-01-18 NOTE — Progress Notes (Signed)
Mobility Specialist Progress Note:   01/18/23 1733  Mobility  Activity Ambulated with assistance in hallway  Level of Assistance Contact guard assist, steadying assist  Assistive Device None  Distance Ambulated (ft) 342 ft  Activity Response Tolerated well  Mobility Referral Yes   Pt eager to ambulate. Found awake in bed with wife at bedside. C/o weakness before hand offered RW pt denied. Overall walked very well through hallway. Pt put back in bed with call bell. Check stitches before and after ambulation. Saw no visible issues.   Pre Mobility Hr 72 O2 94 During Mobility  Post Mobility Hr 87  O2 99  Thompson Grayer Mobility Specialist  Please contact vis Secure Chat or  Rehab Office [phone]

## 2023-01-18 NOTE — Progress Notes (Signed)
  [facility]   MULTIDISCIPLINARY HEART VALVE TEAM  Patient doing well s/p TAVR. He is hemodynamically stable. Groin sites stable. ECG with sinus/old LBBB and no high grade block. Transferred to 4E. Plan for early ambulation after bedrest completed and hopeful discharge over the next 24-48 hours.   [name] PA-C  [facility]  Pager [phone]

## 2023-01-18 NOTE — Discharge Instructions (Signed)

## 2023-01-18 NOTE — Progress Notes (Signed)
  Echocardiogram 2D Echocardiogram has been performed.  Omar Haley 01/18/2023, 9:08 AM

## 2023-01-19 ENCOUNTER — Inpatient Hospital Stay (HOSPITAL_COMMUNITY): Payer: Medicare Other

## 2023-01-19 DIAGNOSIS — Z952 Presence of prosthetic heart valve: Secondary | ICD-10-CM

## 2023-01-19 LAB — CBC
HCT: 33.7 % — ABNORMAL LOW (ref 39.0–52.0)
Hemoglobin: 11.6 g/dL — ABNORMAL LOW (ref 13.0–17.0)
MCH: 29.1 pg (ref 26.0–34.0)
MCHC: 34.4 g/dL (ref 30.0–36.0)
MCV: 84.7 fL (ref 80.0–100.0)
Platelets: 140 10*3/uL — ABNORMAL LOW (ref 150–400)
RBC: 3.98 MIL/uL — ABNORMAL LOW (ref 4.22–5.81)
RDW: 13.8 % (ref 11.5–15.5)
WBC: 7.1 10*3/uL (ref 4.0–10.5)
nRBC: 0 % (ref 0.0–0.2)

## 2023-01-19 LAB — BASIC METABOLIC PANEL
Anion gap: 9 (ref 5–15)
BUN: 15 mg/dL (ref 8–23)
CO2: 24 mmol/L (ref 22–32)
Calcium: 8.5 mg/dL — ABNORMAL LOW (ref 8.9–10.3)
Chloride: 100 mmol/L (ref 98–111)
Creatinine, Ser: 1.1 mg/dL (ref 0.61–1.24)
GFR, Estimated: >60 mL/min (ref >60)
Glucose, Bld: 159 mg/dL — ABNORMAL HIGH (ref 70–99)
Potassium: 3.5 mmol/L (ref 3.5–5.1)
Sodium: 133 mmol/L — ABNORMAL LOW (ref 135–145)

## 2023-01-19 LAB — MAGNESIUM: Magnesium: 1.7 mg/dL (ref 1.7–2.4)

## 2023-01-19 MED FILL — Heparin Sodium (Porcine) Inj 1000 Unit/ML: INTRAMUSCULAR | Qty: 10 | Status: AC

## 2023-01-19 NOTE — Progress Notes (Signed)
CARDIAC REHAB PHASE I   PRE:  Rate/Rhythm: 83 SR    BP: sitting 144/57    SpO2: 94 RA  MODE:  Ambulation: 330 ft   POST:  Rate/Rhythm: 100 ST    BP: sitting 153/43     SpO2: 98 RA  Pt moved out of bed and ambulated with contact guard, slow and steady. No c/o, feels well. Discussed with pt and family walking at home, restrictions, and CRPII. Pt receptive. Eager to get back to his garden, not interested in CRPII. [phone]   Ethelda Chick BS, ACSM-CEP 01/19/2023 11:08 AM

## 2023-01-19 NOTE — Progress Notes (Signed)
Patient and family given discharge instructions, medication list and follow up appointments. IV and tele were dcd. Will discharge home as ordered. Transported to exit via wheel chair and nursing staff. Edrik Rundle, Randall An RN

## 2023-01-19 NOTE — Progress Notes (Signed)
  Echocardiogram 2D Echocardiogram has been performed.  Delcie Roch 01/19/2023, 9:37 AM

## 2023-01-20 ENCOUNTER — Ambulatory Visit: Payer: Medicare Other | Admitting: Internal Medicine

## 2023-01-20 ENCOUNTER — Telehealth: Payer: Self-pay | Admitting: Physician Assistant

## 2023-01-20 NOTE — Telephone Encounter (Signed)
  [facility]   MULTIDISCIPLINARY HEART VALVE TEAM   Patient contacted regarding discharge from [facility] on 01/19/23  Patient understands to follow up with a structural heart APP on 5/15 at [address].  Patient understands discharge instructions? yes Patient understands medications and regimen? yes Patient understands to bring all medications to this visit? yes  [name] PA-C  [facility]

## 2023-01-25 NOTE — Progress Notes (Unsigned)
[facility]   MULTIDISCIPLINARY HEART VALVE CLINIC                                     Cardiology Office Note:    Date:  01/27/2023   ID:  Omar Haley, DOB 06-15-1931, MRN [id number]  PCP:  Alysia Penna, MD  [facility] Cardiologist:  [name], MD  [facility] Electrophysiologist:  None   Referring MD: Alysia Penna, MD   [facility] s/p TAVR  History of Present Illness:    Omar Haley is a 89 y.o. male with a hx of prostate cancer, LBBB, diet-controlled diabetes, HTN, hypothyroidism, HLD, LV dysfunction and severe aortic stenosis s/p TAVR (08/29/23) who presents to clinic for follow up.   He had a presyncopal episode when walking to the mailbox which prompted cardiology referral. He reported progressive exertional fatigue as well as shortness of breath and chest pain. Echocardiogram on 12/14/2022 showed LV dysfunction (EF 35-40%) and severe AS with mean grad 37 mmHg, peak grad 62.1 mmHg, AVA 0.95 cm2, DVI 0.21, SVI 43 as well as moderate AI/MR. [facility] 01/06/23 showed mild to moderate diffuse nonobstructive coronary artery disease.   The patient was evaluated by the multidisciplinary valve team and underwent successful TAVR with a 26 mm Edwards Sapien 3 THV via the TF approach on 01/18/23. Post operative echo showed EF 55%, normally functioning TAVR with a mean gradient of 10 mmHg and no PVL. Treated with one dose of IV lasix given elevated LVEDP at the time of TAVR. Discharged on a baby aspirin.   Today the patient presents to clinic for follow up. Here with his wife. No CP or SOB. No LE edema, orthopnea or PND. No dizziness or syncope. No blood in stool or urine. No palpitations. He still has some leg weakness that had predated surgery and thinks is related to XRT for prostate cancer.     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 Lynnette Caffey & Dr. Laneta Simmers   Unifocal PVCs    Vitamin D deficiency     Past Surgical History:  Procedure Laterality Date   COLON SURGERY  1991   rt hemicolectomy   HERNIA REPAIR     INTRAOPERATIVE TRANSTHORACIC ECHOCARDIOGRAM N/A 01/18/2023   Procedure: INTRAOPERATIVE TRANSTHORACIC ECHOCARDIOGRAM;  Surgeon: [name], MD;  Location: [facility] INVASIVE CV LAB;  Service: Open Heart Surgery;  Laterality: N/A;   MELANOMA EXCISION  11/2005&04/2011   skin   RIGHT/LEFT HEART CATH AND CORONARY ANGIOGRAPHY N/A 01/06/2023   Procedure: RIGHT/LEFT HEART CATH AND CORONARY ANGIOGRAPHY;  Surgeon: [name], MD;  Location: [facility] INVASIVE CV LAB;  Service: Cardiovascular;  Laterality: N/A;   TRANSCATHETER AORTIC VALVE REPLACEMENT, TRANSFEMORAL N/A 01/18/2023  Procedure: Transcatheter Aortic Valve Replacement, Transfemoral;  Surgeon: [name], MD;  Location: [facility] INVASIVE CV LAB;  Service: Open Heart Surgery;  Laterality: N/A;    Current Medications: Current Meds  Medication Sig   acetaminophen (TYLENOL) 650 MG CR tablet Take 650 mg by mouth every 8 (eight) hours as needed for pain.   amoxicillin (AMOXIL) 500 MG tablet Take 4 tablets by mouth 1 hour prior to dental procedures and cleanings.   Aspirin 81 MG EC tablet Take 81 mg by mouth daily.   carvedilol (COREG) 6.25 MG tablet Take 1 tablet (6.25 mg total) by mouth 2 (two) times daily.   ibuprofen (ADVIL,MOTRIN) 200 MG tablet Take 200 mg by mouth every 4 (four) hours as needed for pain, mild pain or moderate pain.    latanoprost (XALATAN) 0.005 % ophthalmic solution Place 1 drop into both eyes at bedtime.   levothyroxine (SYNTHROID) 50 MCG tablet Take 50 mcg by mouth daily before breakfast.   losartan-hydrochlorothiazide (HYZAAR) 100-25 MG tablet Take 1 tablet by mouth daily.   spironolactone (ALDACTONE) 25 MG tablet Take 0.5 tablets (12.5 mg total) by mouth daily.     Allergies:   Enalapril, Gemfibrozil, Micardis [telmisartan], and Zetia [ezetimibe]   Social History   Socioeconomic History   Marital status: Married    Spouse name: Not on file   Number of children: Not on file   Years of education: Not on file   Highest education level: Not on file  Occupational History   Not on file  Tobacco Use   Smoking status: Never   Smokeless tobacco: Not on file  Substance and Sexual Activity   Alcohol use: No   Drug use: No   Sexual activity: Not on file  Other Topics Concern   Not on file  Social History Narrative   Not on file   Social Determinants of Health   Financial Resource Strain: Not on file  Food Insecurity: Not on file  Transportation Needs: Not on file  Physical Activity: Not on file  Stress: Not on file  Social Connections: Not on file     Family History: The patient's family history includes Cerebral aneurysm in his father; Heart failure in his mother.  ROS:   Please see the history of present illness.    All other systems reviewed and are negative.  EKGs/Labs/Other Studies Reviewed:    Cardiac Studies & Procedures   CARDIAC CATHETERIZATION  CARDIAC CATHETERIZATION 01/06/2023  Narrative 1.  Mild to moderate diffuse obstructive coronary artery disease with no high-grade obstructions. 2.  Mean RA pressure of 3, RV pressure of 32/0 with RV end-diastolic pressure of 5 mmHg, mean wedge pressure of 6 mmHg, and mean PA pressure of 12 mmHg with a Fick cardiac output of 8.2 L/min and Fick cardiac index of 4.3 L/min/m.  Recommendation: Continue evaluation for aortic valve  intervention.  Findings Coronary Findings Diagnostic  Dominance: Right  Left Anterior Descending There is mild diffuse disease throughout the vessel.  First Diagonal Branch There is mild disease in the vessel.  Left Circumflex There is moderate diffuse disease throughout the vessel.  Right Coronary Artery There is mild diffuse disease throughout the vessel.  Intervention  No interventions have been documented.     ECHOCARDIOGRAM  ECHOCARDIOGRAM COMPLETE 01/26/2023  Narrative ECHOCARDIOGRAM REPORT    Patient Name:   Omar Haley Date of Exam: 01/19/2023 Medical Rec #:  [id number]     Height:       70.0 in Accession #:  [id number]    Weight:       154.8 lb Date of Birth:  1931/02/21    BSA:          1.872 m Patient Age:    89 years      BP:           124/48 mmHg Patient Gender: M             HR:           68 bpm. Exam Location:  Inpatient  Procedure: 2D Echo, Cardiac Doppler and Color Doppler  Indications:    Post TAVR evaluation  History:        Patient has prior history of Echocardiogram examinations, most recent 01/18/2023. Cardiomyopathy, Arrythmias:LBBB; Risk Factors:Hypertension and Dyslipidemia. Aortic Valve: 26 mm Edwards Sapien prosthetic, stented (TAVR) valve is present in the aortic position. Procedure Date: 01/18/2023.  Sonographer:    Delcie Roch RDCS Referring Phys: [id number] Gailyn Crook R Rashawd Laskaris  IMPRESSIONS   1. 26 mm S3 in aortic position. Vmax 2.1 m/s, MG 10 mmHG, EOA 2.24 cm2. No regurgitation or paravalvular leakl. Normal prosthesis. The aortic valve has been repaired/replaced. Aortic valve regurgitation is not visualized. There is a 26 mm Edwards Sapien prosthetic (TAVR) valve present in the aortic position. Procedure Date: 01/18/2023. Echo findings are consistent with normal structure and function of the aortic valve prosthesis. 2. Left ventricular ejection fraction, by estimation, is 55 to 60%. The left ventricle has normal function. The  left ventricle has no regional wall motion abnormalities. There is mild concentric left ventricular hypertrophy. Left ventricular diastolic parameters are consistent with Grade I diastolic dysfunction (impaired relaxation). 3. Right ventricular systolic function is normal. The right ventricular size is normal. There is mildly elevated pulmonary artery systolic pressure. The estimated right ventricular systolic pressure is 36.9 mmHg. 4. Left atrial size was mildly dilated. 5. The mitral valve is grossly normal. Mild mitral valve regurgitation. No evidence of mitral stenosis. 6. The inferior vena cava is normal in size with greater than 50% respiratory variability, suggesting right atrial pressure of 3 mmHg.  Comparison(s): Changes from prior study are noted. The left ventricular function has improved. EF now 55-60%. Normal AoV prosthesis.  FINDINGS Left Ventricle: Left ventricular ejection fraction, by estimation, is 55 to 60%. The left ventricle has normal function. The left ventricle has no regional wall motion abnormalities. The left ventricular internal cavity size was normal in size. There is mild concentric left ventricular hypertrophy. Left ventricular diastolic parameters are consistent with Grade I diastolic dysfunction (impaired relaxation).  Right Ventricle: The right ventricular size is normal. No increase in right ventricular wall thickness. Right ventricular systolic function is normal. There is mildly elevated pulmonary artery systolic pressure. The tricuspid regurgitant velocity is 2.91 m/s, and with an assumed right atrial pressure of 3 mmHg, the estimated right ventricular systolic pressure is 36.9 mmHg.  Left Atrium: Left atrial size was mildly dilated.  Right Atrium: Right atrial size was normal in size.  Pericardium: There is no evidence of pericardial effusion. Presence of epicardial fat layer.  Mitral Valve: The mitral valve is grossly normal. Mild mitral annular  calcification. Mild mitral valve regurgitation. No evidence of mitral valve stenosis.  Tricuspid Valve: The tricuspid valve is grossly normal. Tricuspid valve regurgitation is mild . No evidence of tricuspid stenosis.  Aortic Valve: 26 mm S3 in aortic position. Vmax 2.1 m/s, MG 10 mmHG, EOA 2.24 cm2. No regurgitation or paravalvular leakl. Normal prosthesis. The aortic valve has been  repaired/replaced. Aortic valve regurgitation is not visualized. Aortic valve mean gradient measures 10.0 mmHg. Aortic valve peak gradient measures 18.0 mmHg. Aortic valve area, by VTI measures 2.24 cm. There is a 26 mm Edwards Sapien prosthetic, stented (TAVR) valve present in the aortic position. Procedure Date: 01/18/2023. Echo findings are consistent with normal structure and function of the aortic valve prosthesis.  Pulmonic Valve: The pulmonic valve was grossly normal. Pulmonic valve regurgitation is not visualized. No evidence of pulmonic stenosis.  Aorta: The aortic root and ascending aorta are structurally normal, with no evidence of dilitation.  Venous: The inferior vena cava is normal in size with greater than 50% respiratory variability, suggesting right atrial pressure of 3 mmHg.  IAS/Shunts: The atrial septum is grossly normal.   LEFT VENTRICLE PLAX 2D LVIDd:         5.00 cm      Diastology LVIDs:         3.30 cm      LV e' medial:    5.44 cm/s LV PW:         1.40 cm      LV E/e' medial:  13.9 LV IVS:        1.30 cm      LV e' lateral:   5.11 cm/s LVOT diam:     2.46 cm      LV E/e' lateral: 14.8 LV SV:         96 LV SV Index:   51 LVOT Area:     4.75 cm  LV Volumes (MOD) LV vol d, MOD A2C: 89.2 ml LV vol d, MOD A4C: 105.0 ml LV vol s, MOD A2C: 45.3 ml LV vol s, MOD A4C: 52.8 ml LV SV MOD A2C:     43.9 ml LV SV MOD A4C:     105.0 ml LV SV MOD BP:      49.4 ml  RIGHT VENTRICLE             IVC RV Basal diam:  2.90 cm     IVC diam: 1.70 cm RV S prime:     14.50 cm/s TAPSE (M-mode): 2.0  cm  LEFT ATRIUM             Index        RIGHT ATRIUM           Index LA diam:        4.20 cm 2.24 cm/m   RA Area:     13.60 cm LA Vol (A2C):   65.7 ml 35.10 ml/m  RA Volume:   31.80 ml  16.99 ml/m LA Vol (A4C):   72.2 ml 38.57 ml/m LA Biplane Vol: 73.2 ml 39.11 ml/m AORTIC VALVE AV Area (Vmax):    2.34 cm AV Area (Vmean):   2.20 cm AV Area (VTI):     2.24 cm AV Vmax:           212.00 cm/s AV Vmean:          141.000 cm/s AV VTI:            0.428 m AV Peak Grad:      18.0 mmHg AV Mean Grad:      10.0 mmHg LVOT Vmax:         104.45 cm/s LVOT Vmean:        65.150 cm/s LVOT VTI:          0.202 m LVOT/AV VTI ratio: 0.47  AORTA Ao Asc diam: 3.30 cm  MITRAL VALVE                TRICUSPID VALVE MV Area (PHT): 2.66 cm     TR Peak grad:   33.9 mmHg MV Decel Time: 285 msec     TR Vmax:        291.00 cm/s MV E velocity: 75.40 cm/s MV A velocity: 119.00 cm/s  SHUNTS MV E/A ratio:  0.63         Systemic VTI:  0.20 m Systemic Diam: 2.46 cm  Lennie Odor MD Electronically signed by Lennie Odor MD Signature Date/Time: 01/26/2023/8:42:16 PM    Final     CT SCANS  CT CORONARY MORPH W/CTA COR W/SCORE 12/29/2022  Addendum 12/29/2022  8:51 AM ADDENDUM REPORT: 12/29/2022 08:49  ADDENDUM: Extracardiac findings will be described separately under dictation for contemporaneously obtained CTA chest, abdomen and pelvis dated 12/27/2022. Please see that dictation for full description of relevant extracardiac findings.   Electronically Signed By: Trudie Reed M.D. On: 12/29/2022 08:49  Narrative CLINICAL DATA:  Aortic valve replacement (TAVR), pre-op eval  EXAM: Cardiac TAVR CT  TECHNIQUE: The patient was scanned on a Siemens Force 192 slice scanner. A 120 kV retrospective scan was triggered in the descending thoracic aorta at 111 HU's. Gantry rotation speed was 270 msecs and collimation was .9 mm. The 3D data set was reconstructed in 5% intervals of the R-R cycle.  Systolic and diastolic phases were analyzed on a dedicated work station using MPR, MIP and VRT modes. The patient received 95mL OMNIPAQUE IOHEXOL 350 MG/ML SOLN of contrast.  FINDINGS: Aortic Valve:  Tricuspid aortic valve with severely reduced cusp excursion. Severely thickened and severely calcified aortic valve cusps.  AV calcium score: 2885  Virtual Basal Annulus Measurements:  Maximum/Minimum Diameter: 28.2 x 23.4 mm  Perimeter: 80.5 mm  Area:  504 mm2  Trivial LVOT calcifications.  Membranous septal length: 6 mm  Based on these measurements, the annulus would be suitable for a 26 mm Sapien 3 valve. Alternatively, Heart Team can consider 29 mm Evolut valve. Recommend Heart Team discussion for valve selection.  Sinus of Valsalva Measurements:  Non-coronary:  32 mm  Right - coronary:  31 mm  Left - coronary:  31 mm  Sinus of Valsalva Height:  Left: 19 mm  Right: 21.4 mm  Aorta: Conventional 3 vessel branch pattern of aortic arch. Severe aortic atherosclerosis.  Sinotubular Junction:  32 mm  Ascending Thoracic Aorta:  33 mm  Aortic Arch:  26 mm  Descending Thoracic Aorta:  23 mm  Coronary Artery Height above Annulus: Coronary origins are above the ST junction.  Left main: 21.6 mm  Right coronary: 25 mm  Coronary Arteries: Normal coronary origin. Right dominance. The study was performed without use of NTG and insufficient for plaque evaluation. Coronary artery calcium seen in 3 vessel distribution. Calcium score 3452.  Optimum Fluoroscopic Angle for Delivery: LAO 12, CAU 11  OTHER:  Atria: Left atrial chamber dilation.  Left atrial appendage: No thrombus.  Mitral valve: Grossly normal, mild mitral annular calcifications.  Pulmonary artery: Normal caliber.  Pulmonary veins: Normal variant anatomy (3 on right and 2 on left).  IMPRESSION: 1. 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 Severely thickened and severely  calcified aortic valve cusps. 2. Aortic valve calcium score: 2885 3. Annulus area: 504 mm2, suitable for 26 mm Sapien 3 valve. Trivial LVOT calcifications. Membranous septal length 6 mm. 4. Sufficient coronary artery heights from annulus. 5. Optimum fluoroscopic angle  for delivery: LAO 12, CAU 11  Electronically Signed: By: Weston Brass M.D. On: 12/27/2022 16:42          EKG:  EKG is ordered today.  The ekg ordered today demonstrates sinus with 1st deg AV block, LBBB, HR 75  Recent Labs: 01/14/2023: ALT 16 01/19/2023: BUN 15; Creatinine, Ser 1.10; Hemoglobin 11.6; Magnesium 1.7; Platelets 140; Potassium 3.5; Sodium 133  Recent Lipid Panel    Component Value Date/Time   CHOL 213 (H) 04/29/2011 0259   TRIG 182 (H) 04/29/2011 0259   HDL 40 04/29/2011 0259   CHOLHDL 5.3 04/29/2011 0259   VLDL 36 04/29/2011 0259   LDLCALC 137 (H) 04/29/2011 0259    [facility]  TAVR OPERATIVE NOTE     Date of Procedure:                01/18/2023   Preoperative Diagnosis:      Severe Aortic Stenosis    Postoperative Diagnosis:    Same    Procedure:        Transcatheter Aortic Valve Replacement - Transfemoral Approach             Edwards Sapien 3 Resilia THV (size 26 mm, model # 9755RLS, serial # [id number] )              Co-Surgeons:                         [name], MD and Alverda Skeans, MD Anesthesiologist:                  Achille Rich, MD   Echocardiographer:              Lennie Odor, MD   Pre-operative Echo Findings: Severe aortic stenosis Moderate left ventricular systolic dysfunction   Post-operative Echo Findings: Trace paravalvular leak Moderate left ventricular systolic dysfunction   Left Heart Catheterization Findings: Left ventricular end-diastolic pressure of    Risk Assessment/Calculations:       Physical Exam:    VS:  BP (!) 148/66   Pulse 81   Ht 5\' 10"  (1.778 m)   Wt 158 lb 6.4 oz (71.8 kg)   SpO2 97%   BMI 22.73 kg/m      Wt Readings from Last 3 Encounters:  01/26/23 158 lb 6.4 oz (71.8 kg)  01/19/23 154 lb 12.2 oz (70.2 kg)  01/12/23 160 lb (72.6 kg)     GEN:  Well nourished, well developed in no acute distress HEENT: Normal NECK: No JVD LYMPHATICS: No lymphadenopathy CARDIAC: RRR, no murmurs, rubs, gallops RESPIRATORY:  Clear to auscultation without rales, wheezing or rhonchi  ABDOMEN: Soft, non-tender, non-distended MUSCULOSKELETAL:  1+ bilateral LE edema; No deformity  SKIN: Warm and dry.  Groin sites clear without hematoma or ecchymosis  NEUROLOGIC:  Alert and oriented x 3 PSYCHIATRIC:  Normal affect   ASSESSMENT:    1. S/P TAVR (transcatheter aortic valve replacement)   2. HFrEF (heart failure with reduced ejection fraction) (HCC)   3. Hypertension, unspecified type   4. Lesion of pancreas    PLAN:    In order of problems listed above:  Severe AS s/p TAVR: doing well 1 week out from TAVR. ECG with no HAVB. Groin sites healing well. SBE prophylaxis discussed; I have RX'd amoxicillin. Continue Asprin 81mg  daily alone. I will see back next month for follow and echo.  HFrEF: EF improved to 55% after TAVR.  Continue Losartan-HCTZ. BP elevated so I added Coreg 6.25mg  BID and spiro 12.5mg  daily. Will check BMET when I see him back next visit   HTN: Bp elevated today. I added Coreg 6.25mg  BID and spiro 12.5mg  daily given cardiomyopathy, although post op echo resulted after this appointment and showed improvement of EF to 55%.    Pancreas lesion: pre TAVR CT showed a "low-attenuation lesions in the body and tail of the pancreas measuring up to 9 x 6 mm, nonspecific, but statistically likely benign lesions such as small side branch IPMN (intraductal papillary mucinous neoplasm). Follow-up abdominal MRI with and without IV gadolinium or pancreatic protocol CT scan should be considered in 2 years to ensure the stability of these findings. This will be discussed in the outpatient setting. This was  printed out for him to discuss with his PCP, Dr. Kennith Center.    Medication Adjustments/Labs and Tests Ordered: Current medicines are reviewed at length with the patient today.  Concerns regarding medicines are outlined above.  Orders Placed This Encounter  Procedures   EKG 12-Lead   Meds ordered this encounter  Medications   carvedilol (COREG) 6.25 MG tablet    Sig: Take 1 tablet (6.25 mg total) by mouth 2 (two) times daily.    Dispense:  180 tablet    Refill:  3   spironolactone (ALDACTONE) 25 MG tablet    Sig: Take 0.5 tablets (12.5 mg total) by mouth daily.    Dispense:  45 tablet    Refill:  3   amoxicillin (AMOXIL) 500 MG tablet    Sig: Take 4 tablets by mouth 1 hour prior to dental procedures and cleanings.    Dispense:  12 tablet    Refill:  6    Patient Instructions  Medication Instructions:  Start Carvedilol (Coreg) 6.25 mg twice a day   Start Spironolactone (Aldactone) 12.5 mg daily   Start Amoxicillin 500 mg, take 4 tablets by mouth 1 hour prior to dental procedures and cleanings.   *If you need a refill on your cardiac medications before your next appointment, please call your pharmacy*   Lab Work: None ordered   If you have labs (blood work) drawn today and your tests are completely normal, you will receive your results only by: MyChart Message (if you have MyChart) OR A paper copy in the mail If you have any lab test that is abnormal or we need to change your treatment, we will call you to review the results.   Testing/Procedures: Echocardiogram as scheduled    Follow-Up: Follow up as scheduled   Other Instructions     Signed, [name], PA-C  01/27/2023 11:04 AM    [facility]

## 2023-01-26 ENCOUNTER — Ambulatory Visit (INDEPENDENT_AMBULATORY_CARE_PROVIDER_SITE_OTHER): Payer: Medicare Other | Admitting: Physician Assistant

## 2023-01-26 VITALS — BP 148/66 | HR 81 | Ht 70.0 in | Wt 158.4 lb

## 2023-01-26 DIAGNOSIS — I5020 Unspecified systolic (congestive) heart failure: Secondary | ICD-10-CM | POA: Diagnosis not present

## 2023-01-26 DIAGNOSIS — K869 Disease of pancreas, unspecified: Secondary | ICD-10-CM

## 2023-01-26 DIAGNOSIS — Z952 Presence of prosthetic heart valve: Secondary | ICD-10-CM

## 2023-01-26 DIAGNOSIS — I10 Essential (primary) hypertension: Secondary | ICD-10-CM | POA: Diagnosis not present

## 2023-01-26 LAB — ECHOCARDIOGRAM COMPLETE
AR max vel: 2.34 cm2
AV Area VTI: 2.24 cm2
AV Area mean vel: 2.2 cm2
AV Mean grad: 10 mmHg
AV Peak grad: 18 mmHg
Ao pk vel: 2.12 m/s
Area-P 1/2: 2.66 cm2
Calc EF: 50 %
Height: 70 in
S' Lateral: 3.3 cm
Single Plane A2C EF: 49.2 %
Single Plane A4C EF: 49.7 %
Weight: 2476.21 oz

## 2023-01-26 MED ORDER — AMOXICILLIN 500 MG PO TABS: ORAL_TABLET | ORAL | 6 refills | Status: AC

## 2023-01-26 MED ORDER — CARVEDILOL 6.25 MG PO TABS: 6.2500 mg | ORAL_TABLET | Freq: Two times a day (BID) | ORAL | 3 refills | Status: DC

## 2023-01-26 MED ORDER — SPIRONOLACTONE 25 MG PO TABS: 12.5000 mg | ORAL_TABLET | Freq: Every day | ORAL | 3 refills | Status: DC

## 2023-01-26 NOTE — Patient Instructions (Signed)
Medication Instructions:  Start Carvedilol (Coreg) 6.25 mg twice a day   Start Spironolactone (Aldactone) 12.5 mg daily   Start Amoxicillin 500 mg, take 4 tablets by mouth 1 hour prior to dental procedures and cleanings.   *If you need a refill on your cardiac medications before your next appointment, please call your pharmacy*   Lab Work: None ordered   If you have labs (blood work) drawn today and your tests are completely normal, you will receive your results only by: MyChart Message (if you have MyChart) OR A paper copy in the mail If you have any lab test that is abnormal or we need to change your treatment, we will call you to review the results.   Testing/Procedures: Echocardiogram as scheduled    Follow-Up: Follow up as scheduled   Other Instructions

## 2023-02-04 ENCOUNTER — Telehealth: Payer: Self-pay | Admitting: Internal Medicine

## 2023-02-04 NOTE — Telephone Encounter (Signed)
I spoke with the patient and his wife in regards to leg weakness and scalp itching.  Per the patient he has had on going issues with leg weakness but his scalp itching started a few days ago.  Per his wife there is a red rash on his scalp and he is itching almost to the point that he could cause bleeding.  The pt was started on Carvedilol and Spironolactone at his last office visit, 5/15. This morning the pt's BP was 169/67, 154/67 and 150/70 prior to medications and the patient is unsure of pulse readings.  I spoke with Georgie Chard NP and at this time we will have the pt hold spironolactone and monitor for improvement in symptoms.  The pt was advised that he can use hydrocortisone cream and take benadryl if needed.  The pt will contact the office next week with an update on his symptoms. Pt agreed with plan.

## 2023-02-04 NOTE — Telephone Encounter (Signed)
Pt c/o medication issue:  1. Name of Medication: carvedilol (COREG) 6.25 MG tablet  spironolactone (ALDACTONE) 25 MG tablet   2. How are you currently taking this medication (dosage and times per day)? As prescribed except has not taken today.   3. Are you having a reaction (difficulty breathing--STAT)? Yes   4. What is your medication issue? Patient's wife is calling to states that patient's legs are weak and his scalp seems to be itchy, and believes it may be an issue with medication. Please advise.

## 2023-02-08 NOTE — Telephone Encounter (Signed)
The pt's wife called into the office to give an update on how Omar Haley is feeling since holding spironolactone.  She states that the patient is feeling better and the scalp itching has almost completely resolved.  I advised her to have the patient continue holding spironolactone and we will see the patient on 6/7 in follow up.  I asked her to have the patient check his BP and pulse a few times per week and write down the readings and bring to his appointment. She agreed with plan.

## 2023-02-09 ENCOUNTER — Other Ambulatory Visit: Payer: Self-pay

## 2023-02-15 ENCOUNTER — Ambulatory Visit: Payer: Medicare Other | Admitting: Nurse Practitioner

## 2023-02-18 ENCOUNTER — Ambulatory Visit (HOSPITAL_BASED_OUTPATIENT_CLINIC_OR_DEPARTMENT_OTHER): Payer: Medicare Other

## 2023-02-18 ENCOUNTER — Ambulatory Visit: Payer: Medicare Other | Attending: Cardiology | Admitting: Physician Assistant

## 2023-02-18 VITALS — BP 180/100 | HR 71 | Ht 70.0 in | Wt 162.6 lb

## 2023-02-18 DIAGNOSIS — K869 Disease of pancreas, unspecified: Secondary | ICD-10-CM

## 2023-02-18 DIAGNOSIS — Z952 Presence of prosthetic heart valve: Secondary | ICD-10-CM

## 2023-02-18 DIAGNOSIS — I10 Essential (primary) hypertension: Secondary | ICD-10-CM | POA: Diagnosis not present

## 2023-02-18 DIAGNOSIS — I5020 Unspecified systolic (congestive) heart failure: Secondary | ICD-10-CM

## 2023-02-18 LAB — ECHOCARDIOGRAM COMPLETE
AR max vel: 2.8 cm2
AV Area VTI: 2.59 cm2
AV Area mean vel: 2.59 cm2
AV Mean grad: 13.7 mmHg
AV Peak grad: 22.2 mmHg
Ao pk vel: 2.35 m/s
Area-P 1/2: 2.5 cm2
Calc EF: 49.6 %
MV M vel: 5.93 m/s
MV Peak grad: 140.7 mmHg
MV VTI: 4.16 cm2
Radius: 0.4 cm
S' Lateral: 4 cm
Single Plane A2C EF: 54.2 %
Single Plane A4C EF: 50.2 %

## 2023-02-18 MED ORDER — AMLODIPINE BESYLATE 10 MG PO TABS: 10.0000 mg | ORAL_TABLET | Freq: Every day | ORAL | 3 refills | Status: DC

## 2023-02-18 NOTE — Patient Instructions (Signed)
Medication Instructions:  Your physician has recommended you make the following change in your medication:   STOP Carvedilol (Coreg)  START Amlodipine 10 mg taking 1 daily   *If you need a refill on your cardiac medications before your next appointment, please call your pharmacy*   Lab Work: None ordered  If you have labs (blood work) drawn today and your tests are completely normal, you will receive your results only by: MyChart Message (if you have MyChart) OR A paper copy in the mail If you have any lab test that is abnormal or we need to change your treatment, we will call you to review the results.   Testing/Procedures: None ordered  Follow-Up: At [facility], you and your health needs are our priority.  As part of our continuing mission to provide you with exceptional heart care, we have created designated Provider Care Teams.  These Care Teams include your primary Cardiologist (physician) and Advanced Practice Providers (APPs -  Physician Assistants and Nurse Practitioners) who all work together to provide you with the care you need, when you need it.  We recommend signing up for the patient portal called "MyChart".  Sign up information is provided on this After Visit Summary.  MyChart is used to connect with patients for Virtual Visits (Telemedicine).  Patients are able to view lab/test results, encounter notes, upcoming appointments, etc.  Non-urgent messages can be sent to your provider as well.   To learn more about what you can do with MyChart, go to [email or web address].    Your next appointment:   As scheduled   Provider:   Carlean Jews, PA-C    Other Instructions

## 2023-02-18 NOTE — Progress Notes (Unsigned)
[facility]   MULTIDISCIPLINARY HEART VALVE CLINIC                                     Cardiology Office Note:    Date:  02/18/2023   ID:  Omar Haley, DOB 06/20/1931, MRN [id number]  PCP:  Alysia Penna, MD  [facility] Cardiologist:  [name], MD  [facility] Electrophysiologist:  None   Referring MD: Alysia Penna, MD   1 month s/p TAVR  History of Present Illness:    Omar Haley is a 89 y.o. male with a hx of prostate cancer, LBBB, diet-controlled diabetes, HTN, hypothyroidism, HLD, LV dysfunction and severe aortic stenosis s/p TAVR (08/29/23) who presents to clinic for follow up.   He had a presyncopal episode when walking to the mailbox which prompted cardiology referral. He reported progressive exertional fatigue as well as shortness of breath and chest pain. Echocardiogram on 12/14/2022 showed LV dysfunction (EF 35-40%) and severe AS with mean grad 37 mmHg, peak grad 62.1 mmHg, AVA 0.95 cm2, DVI 0.21, SVI 43 as well as moderate AI/MR. [facility] 01/06/23 showed mild to moderate diffuse nonobstructive coronary artery disease.   The patient was evaluated by the multidisciplinary valve team and underwent successful TAVR with a 26 mm Edwards Sapien 3 THV via the TF approach on 01/18/23. Post operative echo showed EF 55%, normally functioning TAVR with a mean gradient of 10 mmHg and no PVL as well as mild MR. Treated with one dose of IV lasix given elevated LVEDP at the time of TAVR. Discharged on a baby aspirin.   At follow up he was doing well. BP was elevated and he was started on Coreg 6.25mg  BID and spiro 12.5mg  daily given cardiomyopathy. He did not tolerate spiro due to itching and stopped.   Today the patient presents to clinic for follow up. Has itchiness and ear redness that he attributed to spironolactone and stopped this. Has had worsening of chronic leg weakness, R>L. 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 Lynnette Caffey & Dr. Laneta Simmers   Unifocal PVCs    Vitamin D deficiency     Past Surgical History:  Procedure Laterality Date   COLON SURGERY  1991   rt hemicolectomy   HERNIA REPAIR     INTRAOPERATIVE TRANSTHORACIC ECHOCARDIOGRAM N/A 01/18/2023   Procedure: INTRAOPERATIVE TRANSTHORACIC ECHOCARDIOGRAM;  Surgeon: [name], MD;  Location: [facility] INVASIVE CV LAB;  Service: Open Heart Surgery;  Laterality: N/A;   MELANOMA EXCISION  11/2005&04/2011   skin   RIGHT/LEFT HEART CATH AND CORONARY ANGIOGRAPHY N/A 01/06/2023  Procedure: RIGHT/LEFT HEART CATH AND CORONARY ANGIOGRAPHY;  Surgeon: [name], MD;  Location: [facility] INVASIVE CV LAB;  Service: Cardiovascular;  Laterality: N/A;   TRANSCATHETER AORTIC VALVE REPLACEMENT, TRANSFEMORAL N/A 01/18/2023   Procedure: Transcatheter Aortic Valve Replacement, Transfemoral;  Surgeon: [name], MD;  Location: [facility] INVASIVE CV LAB;  Service: Open Heart Surgery;  Laterality: N/A;    Current Medications: No outpatient medications have been marked as taking for the 02/18/23 encounter (Office Visit) with [facility].     Allergies:   Enalapril, Gemfibrozil, Micardis [telmisartan], Spironolactone, and Zetia [ezetimibe]   Social History   Socioeconomic History   Marital status: Married    Spouse name: Not on file    Number of children: Not on file   Years of education: Not on file   Highest education level: Not on file  Occupational History   Not on file  Tobacco Use   Smoking status: Never   Smokeless tobacco: Not on file  Substance and Sexual Activity   Alcohol use: No   Drug use: No   Sexual activity: Not on file  Other Topics Concern   Not on file  Social History Narrative   Not on file   Social Determinants of Health   Financial Resource Strain: Not on file  Food Insecurity: Not on file  Transportation Needs: Not on file  Physical Activity: Not on file  Stress: Not on file  Social Connections: Not on file     Family History: The patient's family history includes Cerebral aneurysm in his father; Heart failure in his mother.  ROS:   Please see the history of present illness.    All other systems reviewed and are negative.  EKGs/Labs/Other Studies Reviewed:    Cardiac Studies & Procedures   CARDIAC CATHETERIZATION  CARDIAC CATHETERIZATION 01/06/2023  Narrative 1.  Mild to moderate diffuse obstructive coronary artery disease with no high-grade obstructions. 2.  Mean RA pressure of 3, RV pressure of 32/0 with RV end-diastolic pressure of 5 mmHg, mean wedge pressure of 6 mmHg, and mean PA pressure of 12 mmHg with a Fick cardiac output of 8.2 L/min and Fick cardiac index of 4.3 L/min/m.  Recommendation: Continue evaluation for aortic valve intervention.  Findings Coronary Findings Diagnostic  Dominance: Right  Left Anterior Descending There is mild diffuse disease throughout the vessel.  First Diagonal Branch There is mild disease in the vessel.  Left Circumflex There is moderate diffuse disease throughout the vessel.  Right Coronary Artery There is mild diffuse disease throughout the vessel.  Intervention  No interventions have been documented.     ECHOCARDIOGRAM  ECHOCARDIOGRAM COMPLETE 01/26/2023  Narrative ECHOCARDIOGRAM REPORT    Patient Name:    Omar Haley Date of Exam: 01/19/2023 Medical Rec #:  [id number]     Height:       70.0 in Accession #:    [id number]    Weight:       154.8 lb Date of Birth:  Mar 23, 1931    BSA:          1.872 m Patient Age:    89 years      BP:           124/48 mmHg Patient Gender: M             HR:           68 bpm. Exam Location:  Inpatient  Procedure: 2D Echo, Cardiac Doppler and Color Doppler  Indications:  Post TAVR evaluation  History:        Patient has prior history of Echocardiogram examinations, most recent 01/18/2023. Cardiomyopathy, Arrythmias:LBBB; Risk Factors:Hypertension and Dyslipidemia. Aortic Valve: 26 mm Edwards Sapien prosthetic, stented (TAVR) valve is present in the aortic position. Procedure Date: 01/18/2023.  Sonographer:    Delcie Roch RDCS Referring Phys: [id number] Danasha Melman R Enza Shone  IMPRESSIONS   1. 26 mm S3 in aortic position. Vmax 2.1 m/s, MG 10 mmHG, EOA 2.24 cm2. No regurgitation or paravalvular leakl. Normal prosthesis. The aortic valve has been repaired/replaced. Aortic valve regurgitation is not visualized. There is a 26 mm Edwards Sapien prosthetic (TAVR) valve present in the aortic position. Procedure Date: 01/18/2023. Echo findings are consistent with normal structure and function of the aortic valve prosthesis. 2. Left ventricular ejection fraction, by estimation, is 55 to 60%. The left ventricle has normal function. The left ventricle has no regional wall motion abnormalities. There is mild concentric left ventricular hypertrophy. Left ventricular diastolic parameters are consistent with Grade I diastolic dysfunction (impaired relaxation). 3. Right ventricular systolic function is normal. The right ventricular size is normal. There is mildly elevated pulmonary artery systolic pressure. The estimated right ventricular systolic pressure is 36.9 mmHg. 4. Left atrial size was mildly dilated. 5. The mitral valve is grossly normal. Mild mitral valve regurgitation. No  evidence of mitral stenosis. 6. The inferior vena cava is normal in size with greater than 50% respiratory variability, suggesting right atrial pressure of 3 mmHg.  Comparison(s): Changes from prior study are noted. The left ventricular function has improved. EF now 55-60%. Normal AoV prosthesis.  FINDINGS Left Ventricle: Left ventricular ejection fraction, by estimation, is 55 to 60%. The left ventricle has normal function. The left ventricle has no regional wall motion abnormalities. The left ventricular internal cavity size was normal in size. There is mild concentric left ventricular hypertrophy. Left ventricular diastolic parameters are consistent with Grade I diastolic dysfunction (impaired relaxation).  Right Ventricle: The right ventricular size is normal. No increase in right ventricular wall thickness. Right ventricular systolic function is normal. There is mildly elevated pulmonary artery systolic pressure. The tricuspid regurgitant velocity is 2.91 m/s, and with an assumed right atrial pressure of 3 mmHg, the estimated right ventricular systolic pressure is 36.9 mmHg.  Left Atrium: Left atrial size was mildly dilated.  Right Atrium: Right atrial size was normal in size.  Pericardium: There is no evidence of pericardial effusion. Presence of epicardial fat layer.  Mitral Valve: The mitral valve is grossly normal. Mild mitral annular calcification. Mild mitral valve regurgitation. No evidence of mitral valve stenosis.  Tricuspid Valve: The tricuspid valve is grossly normal. Tricuspid valve regurgitation is mild . No evidence of tricuspid stenosis.  Aortic Valve: 26 mm S3 in aortic position. Vmax 2.1 m/s, MG 10 mmHG, EOA 2.24 cm2. No regurgitation or paravalvular leakl. Normal prosthesis. The aortic valve has been repaired/replaced. Aortic valve regurgitation is not visualized. Aortic valve mean gradient measures 10.0 mmHg. Aortic valve peak gradient measures 18.0 mmHg. Aortic valve  area, by VTI measures 2.24 cm. There is a 26 mm Edwards Sapien prosthetic, stented (TAVR) valve present in the aortic position. Procedure Date: 01/18/2023. Echo findings are consistent with normal structure and function of the aortic valve prosthesis.  Pulmonic Valve: The pulmonic valve was grossly normal. Pulmonic valve regurgitation is not visualized. No evidence of pulmonic stenosis.  Aorta: The aortic root and ascending aorta are structurally normal, with no evidence of dilitation.  Venous: The inferior vena cava  is normal in size with greater than 50% respiratory variability, suggesting right atrial pressure of 3 mmHg.  IAS/Shunts: The atrial septum is grossly normal.   LEFT VENTRICLE PLAX 2D LVIDd:         5.00 cm      Diastology LVIDs:         3.30 cm      LV e' medial:    5.44 cm/s LV PW:         1.40 cm      LV E/e' medial:  13.9 LV IVS:        1.30 cm      LV e' lateral:   5.11 cm/s LVOT diam:     2.46 cm      LV E/e' lateral: 14.8 LV SV:         96 LV SV Index:   51 LVOT Area:     4.75 cm  LV Volumes (MOD) LV vol d, MOD A2C: 89.2 ml LV vol d, MOD A4C: 105.0 ml LV vol s, MOD A2C: 45.3 ml LV vol s, MOD A4C: 52.8 ml LV SV MOD A2C:     43.9 ml LV SV MOD A4C:     105.0 ml LV SV MOD BP:      49.4 ml  RIGHT VENTRICLE             IVC RV Basal diam:  2.90 cm     IVC diam: 1.70 cm RV S prime:     14.50 cm/s TAPSE (M-mode): 2.0 cm  LEFT ATRIUM             Index        RIGHT ATRIUM           Index LA diam:        4.20 cm 2.24 cm/m   RA Area:     13.60 cm LA Vol (A2C):   65.7 ml 35.10 ml/m  RA Volume:   31.80 ml  16.99 ml/m LA Vol (A4C):   72.2 ml 38.57 ml/m LA Biplane Vol: 73.2 ml 39.11 ml/m AORTIC VALVE AV Area (Vmax):    2.34 cm AV Area (Vmean):   2.20 cm AV Area (VTI):     2.24 cm AV Vmax:           212.00 cm/s AV Vmean:          141.000 cm/s AV VTI:            0.428 m AV Peak Grad:      18.0 mmHg AV Mean Grad:      10.0 mmHg LVOT Vmax:         104.45  cm/s LVOT Vmean:        65.150 cm/s LVOT VTI:          0.202 m LVOT/AV VTI ratio: 0.47  AORTA Ao Asc diam: 3.30 cm  MITRAL VALVE                TRICUSPID VALVE MV Area (PHT): 2.66 cm     TR Peak grad:   33.9 mmHg MV Decel Time: 285 msec     TR Vmax:        291.00 cm/s MV E velocity: 75.40 cm/s MV A velocity: 119.00 cm/s  SHUNTS MV E/A ratio:  0.63         Systemic VTI:  0.20 m Systemic Diam: 2.46 cm  Lennie Odor MD Electronically signed by Lennie Odor MD Signature Date/Time: 01/26/2023/8:42:16 PM  Final     CT SCANS  CT CORONARY MORPH W/CTA COR W/SCORE 12/29/2022  Addendum 12/29/2022  8:51 AM ADDENDUM REPORT: 12/29/2022 08:49  ADDENDUM: Extracardiac findings will be described separately under dictation for contemporaneously obtained CTA chest, abdomen and pelvis dated 12/27/2022. Please see that dictation for full description of relevant extracardiac findings.   Electronically Signed By: Trudie Reed M.D. On: 12/29/2022 08:49  Narrative CLINICAL DATA:  Aortic valve replacement (TAVR), pre-op eval  EXAM: Cardiac TAVR CT  TECHNIQUE: The patient was scanned on a Siemens Force 192 slice scanner. A 120 kV retrospective scan was triggered in the descending thoracic aorta at 111 HU's. Gantry rotation speed was 270 msecs and collimation was .9 mm. The 3D data set was reconstructed in 5% intervals of the R-R cycle. Systolic and diastolic phases were analyzed on a dedicated work station using MPR, MIP and VRT modes. The patient received 95mL OMNIPAQUE IOHEXOL 350 MG/ML SOLN of contrast.  FINDINGS: Aortic Valve:  Tricuspid aortic valve with severely reduced cusp excursion. Severely thickened and severely calcified aortic valve cusps.  AV calcium score: 2885  Virtual Basal Annulus Measurements:  Maximum/Minimum Diameter: 28.2 x 23.4 mm  Perimeter: 80.5 mm  Area:  504 mm2  Trivial LVOT calcifications.  Membranous septal length: 6 mm  Based on  these measurements, the annulus would be suitable for a 26 mm Sapien 3 valve. Alternatively, Heart Team can consider 29 mm Evolut valve. Recommend Heart Team discussion for valve selection.  Sinus of Valsalva Measurements:  Non-coronary:  32 mm  Right - coronary:  31 mm  Left - coronary:  31 mm  Sinus of Valsalva Height:  Left: 19 mm  Right: 21.4 mm  Aorta: Conventional 3 vessel branch pattern of aortic arch. Severe aortic atherosclerosis.  Sinotubular Junction:  32 mm  Ascending Thoracic Aorta:  33 mm  Aortic Arch:  26 mm  Descending Thoracic Aorta:  23 mm  Coronary Artery Height above Annulus: Coronary origins are above the ST junction.  Left main: 21.6 mm  Right coronary: 25 mm  Coronary Arteries: Normal coronary origin. Right dominance. The study was performed without use of NTG and insufficient for plaque evaluation. Coronary artery calcium seen in 3 vessel distribution. Calcium score 3452.  Optimum Fluoroscopic Angle for Delivery: LAO 12, CAU 11  OTHER:  Atria: Left atrial chamber dilation.  Left atrial appendage: No thrombus.  Mitral valve: Grossly normal, mild mitral annular calcifications.  Pulmonary artery: Normal caliber.  Pulmonary veins: Normal variant anatomy (3 on right and 2 on left).  IMPRESSION: 1. 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 Severely thickened and severely calcified aortic valve cusps. 2. Aortic valve calcium score: 2885 3. Annulus area: 504 mm2, suitable for 26 mm Sapien 3 valve. Trivial LVOT calcifications. Membranous septal length 6 mm. 4. Sufficient coronary artery heights from annulus. 5. Optimum fluoroscopic angle for delivery: LAO 12, CAU 11  Electronically Signed: By: Weston Brass M.D. On: 12/27/2022 16:42          EKG:  EKG is ordered today.  The ekg ordered today demonstrates sinus with 1st deg AV block, LBBB, HR 75  Recent Labs: 01/14/2023: ALT 16 01/19/2023: BUN 15; Creatinine,  Ser 1.10; Hemoglobin 11.6; Magnesium 1.7; Platelets 140; Potassium 3.5; Sodium 133  Recent Lipid Panel    Component Value Date/Time   CHOL 213 (H) 04/29/2011 0259   TRIG 182 (H) 04/29/2011 0259   HDL 40 04/29/2011 0259   CHOLHDL 5.3 04/29/2011 0259  VLDL 36 04/29/2011 0259   LDLCALC 137 (H) 04/29/2011 0259    [facility]  TAVR OPERATIVE NOTE     Date of Procedure:                01/18/2023   Preoperative Diagnosis:      Severe Aortic Stenosis    Postoperative Diagnosis:    Same    Procedure:        Transcatheter Aortic Valve Replacement - Transfemoral Approach             Edwards Sapien 3 Resilia THV (size 26 mm, model # 9755RLS, serial # [id number] )              Co-Surgeons:                         [name], MD and Alverda Skeans, MD Anesthesiologist:                  Achille Rich, MD   Echocardiographer:              Lennie Odor, MD   Pre-operative Echo Findings: Severe aortic stenosis Moderate left ventricular systolic dysfunction   Post-operative Echo Findings: Trace paravalvular leak Moderate left ventricular systolic dysfunction   Left Heart Catheterization Findings: Left ventricular end-diastolic pressure of    Risk Assessment/Calculations:       Physical Exam:    VS:  Ht 5\' 10"  (1.778 m)   BMI 22.73 kg/m     Wt Readings from Last 3 Encounters:  01/26/23 158 lb 6.4 oz (71.8 kg)  01/19/23 154 lb 12.2 oz (70.2 kg)  01/12/23 160 lb (72.6 kg)     GEN:  Well nourished, well developed in no acute distress HEENT: Normal NECK: No JVD LYMPHATICS: No lymphadenopathy CARDIAC: RRR, no murmurs, rubs, gallops RESPIRATORY:  Clear to auscultation without rales, wheezing or rhonchi  ABDOMEN: Soft, non-tender, non-distended MUSCULOSKELETAL:  1+ bilateral LE edema; No deformity  SKIN: Warm and dry.  Groin sites clear without hematoma or ecchymosis  NEUROLOGIC:  Alert and oriented x 3 PSYCHIATRIC:  Normal affect   ASSESSMENT:     No diagnosis found.  PLAN:    In order of problems listed above:  Severe AS s/p TAVR: echo today shows EF 40% with septal dyssnchrony, normally functioning TAVR with a mean gradient of 10 mm hg and mild PVL. He has NYHA class I symptoms and mostly limited by LE weakness. SBE prophylaxis discussed; I have RX'd amoxicillin. Continue Asprin 81mg  daily alone. I will see back next month for follow and echo.  HFrEF: EF improved to 55% after TAVR. Continue Losartan-HCTZ.    HTN: BP 180/100. He did not tolerate spiro. Thinks coreg is causing worsening of chronic LE weakness. Will plan to stop coreg now and see if it helps leg weakness and start Norvasc 10mg  daily. I will see him back in 2 weeks to follow symptoms and BP. Instructed to keep a home log.    Pancreas lesion: pre TAVR CT showed a "low-attenuation lesions in the body and tail of the pancreas measuring up to 9 x 6 mm, nonspecific, but statistically likely benign lesions such as small side branch IPMN (intraductal papillary mucinous neoplasm). Follow-up abdominal MRI with and without IV gadolinium or pancreatic protocol CT scan should be considered in 2 years to ensure the stability of these findings. This was printed out for him to discuss with his PCP,  Dr. Kennith Center.    Medication Adjustments/Labs and Tests Ordered: Current medicines are reviewed at length with the patient today.  Concerns regarding medicines are outlined above.  No orders of the defined types were placed in this encounter.  No orders of the defined types were placed in this encounter.   There are no Patient Instructions on file for this visit.   Signed, [name], PA-C  02/18/2023 9:06 AM    [facility]

## 2023-02-21 ENCOUNTER — Other Ambulatory Visit: Payer: Self-pay | Admitting: Physician Assistant

## 2023-02-21 DIAGNOSIS — Z952 Presence of prosthetic heart valve: Secondary | ICD-10-CM

## 2023-02-22 DIAGNOSIS — I5022 Chronic systolic (congestive) heart failure: Secondary | ICD-10-CM | POA: Diagnosis not present

## 2023-02-22 DIAGNOSIS — E039 Hypothyroidism, unspecified: Secondary | ICD-10-CM | POA: Diagnosis not present

## 2023-02-22 DIAGNOSIS — C61 Malignant neoplasm of prostate: Secondary | ICD-10-CM | POA: Diagnosis not present

## 2023-02-22 DIAGNOSIS — E559 Vitamin D deficiency, unspecified: Secondary | ICD-10-CM | POA: Diagnosis not present

## 2023-02-22 DIAGNOSIS — I110 Hypertensive heart disease with heart failure: Secondary | ICD-10-CM | POA: Diagnosis not present

## 2023-02-22 DIAGNOSIS — E785 Hyperlipidemia, unspecified: Secondary | ICD-10-CM | POA: Diagnosis not present

## 2023-02-22 DIAGNOSIS — D692 Other nonthrombocytopenic purpura: Secondary | ICD-10-CM | POA: Diagnosis not present

## 2023-02-22 DIAGNOSIS — E1169 Type 2 diabetes mellitus with other specified complication: Secondary | ICD-10-CM | POA: Diagnosis not present

## 2023-02-22 DIAGNOSIS — K869 Disease of pancreas, unspecified: Secondary | ICD-10-CM | POA: Diagnosis not present

## 2023-02-22 DIAGNOSIS — Z952 Presence of prosthetic heart valve: Secondary | ICD-10-CM | POA: Diagnosis not present

## 2023-02-22 DIAGNOSIS — I350 Nonrheumatic aortic (valve) stenosis: Secondary | ICD-10-CM | POA: Diagnosis not present

## 2023-02-28 DIAGNOSIS — H40053 Ocular hypertension, bilateral: Secondary | ICD-10-CM | POA: Diagnosis not present

## 2023-02-28 DIAGNOSIS — H26491 Other secondary cataract, right eye: Secondary | ICD-10-CM | POA: Diagnosis not present

## 2023-02-28 DIAGNOSIS — Z961 Presence of intraocular lens: Secondary | ICD-10-CM | POA: Diagnosis not present

## 2023-02-28 DIAGNOSIS — H52203 Unspecified astigmatism, bilateral: Secondary | ICD-10-CM | POA: Diagnosis not present

## 2023-03-04 ENCOUNTER — Ambulatory Visit: Payer: Medicare Other | Attending: Cardiology | Admitting: Physician Assistant

## 2023-03-04 VITALS — BP 152/70 | HR 57 | Ht 70.0 in | Wt 161.0 lb

## 2023-03-04 DIAGNOSIS — I10 Essential (primary) hypertension: Secondary | ICD-10-CM | POA: Diagnosis not present

## 2023-03-04 DIAGNOSIS — K869 Disease of pancreas, unspecified: Secondary | ICD-10-CM | POA: Insufficient documentation

## 2023-03-04 DIAGNOSIS — I5020 Unspecified systolic (congestive) heart failure: Secondary | ICD-10-CM | POA: Insufficient documentation

## 2023-03-04 DIAGNOSIS — Z952 Presence of prosthetic heart valve: Secondary | ICD-10-CM | POA: Diagnosis not present

## 2023-03-04 NOTE — Patient Instructions (Signed)
Medication Instructions:  Your physician has recommended you make the following change in your medication:  RESTART COREG 6.25 MG TWICE DAILY. *If you need a refill on your cardiac medications before your next appointment, please call your pharmacy*   Lab Work: NONE If you have labs (blood work) drawn today and your tests are completely normal, you will receive your results only by: MyChart Message (if you have MyChart) OR A paper copy in the mail If you have any lab test that is abnormal or we need to change your treatment, we will call you to review the results.   Testing/Procedures: NONE   Follow-Up: At [facility], you and your health needs are our priority.  As part of our continuing mission to provide you with exceptional heart care, we have created designated Provider Care Teams.  These Care Teams include your primary Cardiologist (physician) and Advanced Practice Providers (APPs -  Physician Assistants and Nurse Practitioners) who all work together to provide you with the care you need, when you need it.  We recommend signing up for the patient portal called "MyChart".  Sign up information is provided on this After Visit Summary.  MyChart is used to connect with patients for Virtual Visits (Telemedicine).  Patients are able to view lab/test results, encounter notes, upcoming appointments, etc.  Non-urgent messages can be sent to your provider as well.   To learn more about what you can do with MyChart, go to [email or web address].    Your next appointment:   KEEP SCHEDULE FOLLOW-UP

## 2023-03-04 NOTE — Progress Notes (Signed)
[facility]   MULTIDISCIPLINARY HEART VALVE CLINIC                                     Cardiology Office Note:    Date:  03/04/2023   ID:  Omar Haley, DOB 1931-03-05, MRN [id number]  PCP:  Omar Penna, MD  [facility] Cardiologist:  Omar Pyo, MD  [facility] Electrophysiologist:  None   Referring MD: Omar Penna, MD   Follow up BP  History of Present Illness:    Omar Haley is a 89 y.o. male with a hx of prostate cancer, LBBB, diet-controlled diabetes, HTN, hypothyroidism, HLD, LV dysfunction and severe aortic stenosis s/p TAVR (08/29/23) who presents to clinic for follow up.   He had a presyncopal episode when walking to the mailbox which prompted cardiology referral. He reported progressive exertional fatigue as well as shortness of breath and chest pain. Echocardiogram on 12/14/2022 showed LV dysfunction (EF 35-40%) and severe AS with mean grad 37 mmHg, peak grad 62.1 mmHg, AVA 0.95 cm2, DVI 0.21, SVI 43 as well as moderate AI/MR. [facility] 01/06/23 showed mild to moderate diffuse nonobstructive coronary artery disease.   The patient was evaluated by the multidisciplinary valve team and underwent successful TAVR with a 26 mm Edwards Sapien 3 THV via the TF approach on 01/18/23. Post operative echo showed EF 55%, normally functioning TAVR with a mean gradient of 10 mmHg and no PVL as well as mild MR. Treated with one dose of IV lasix given elevated LVEDP at the time of TAVR. Discharged on a baby aspirin. 1 month echo showed EF 50-55%, normally functioning TAVR with a mean gradient of 13.7 mm hg and mild PVL as well as mild to moderate MR. He has had issues with hypertension and LE weakness. Has many medication intolerances. Felt like Coreg may be contributing to leg weakness, although this predated TAVR and initiation of Coreg, but felt to be worse recently. At his last visit, we stopped coreg and started Norvasc 10mg  daily.   Today the patient presents to  clinic for follow up. Here with wife. Legs remain weak with no change with stopping Coreg. Tolerating Norvasc well. Has some mild LE edema which may be slightly worse than previous but not bothersome. No CP or SOB. No orthopnea or PND. No dizziness or syncope. No blood in stool or urine. No palpitations.    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 Lynnette Haley & Dr. Laneta Haley   Unifocal PVCs    Vitamin D deficiency     Past Surgical History:  Procedure Laterality Date   COLON SURGERY  1991   rt hemicolectomy   HERNIA REPAIR     INTRAOPERATIVE TRANSTHORACIC ECHOCARDIOGRAM  N/A 01/18/2023   Procedure: INTRAOPERATIVE TRANSTHORACIC ECHOCARDIOGRAM;  Surgeon: Omar Pyo, MD;  Location: [facility] [place] INVASIVE CV LAB;  Service: Open Heart Surgery;  Laterality: N/A;   MELANOMA EXCISION  11/2005&04/2011   skin   RIGHT/LEFT HEART CATH AND CORONARY ANGIOGRAPHY N/A 01/06/2023   Procedure: RIGHT/LEFT HEART CATH AND CORONARY ANGIOGRAPHY;  Surgeon: Omar Pyo, MD;  Location: [facility] INVASIVE CV LAB;  Service: Cardiovascular;  Laterality: N/A;   TRANSCATHETER AORTIC VALVE REPLACEMENT, TRANSFEMORAL N/A 01/18/2023   Procedure: Transcatheter Aortic Valve Replacement, Transfemoral;  Surgeon: Omar Pyo, MD;  Location: [facility] INVASIVE CV LAB;  Service: Open Heart Surgery;  Laterality: N/A;    Current Medications: Current Meds  Medication Sig   acetaminophen  (TYLENOL) 650 MG CR tablet Take 650 mg by mouth every 8 (eight) hours as needed for pain.   amLODipine (NORVASC) 10 MG tablet Take 1 tablet (10 mg total) by mouth daily.   amoxicillin (AMOXIL) 500 MG tablet Take 4 tablets by mouth 1 hour prior to dental procedures and cleanings.   Aspirin 81 MG EC tablet Take 81 mg by mouth daily.   carvedilol (COREG) 6.25 MG tablet Take 6.25 mg by mouth 2 (two) times daily with a meal.   ibuprofen (ADVIL,MOTRIN) 200 MG tablet Take 200 mg by mouth every 4 (four) hours as needed for pain, mild pain or moderate pain.   latanoprost (XALATAN) 0.005 % ophthalmic solution Place 1 drop into both eyes at bedtime.   levothyroxine (SYNTHROID) 50 MCG tablet Take 50 mcg by mouth daily before breakfast.   losartan-hydrochlorothiazide (HYZAAR) 100-25 MG tablet Take 1 tablet by mouth daily.     Allergies:   Enalapril, Gemfibrozil, Micardis [telmisartan], Spironolactone, and Zetia [ezetimibe]   Social History   Socioeconomic History   Marital status: Married    Spouse name: Not on file   Number of children: Not on file   Years of education: Not on file   Highest education level: Not on file  Occupational History   Not on file  Tobacco Use   Smoking status: Never   Smokeless tobacco: Not on file  Substance and Sexual Activity   Alcohol use: No   Drug use: No   Sexual activity: Not on file  Other Topics Concern   Not on file  Social History Narrative   Not on file   Social Determinants of Health   Financial Resource Strain: Not on file  Food Insecurity: Not on file  Transportation Needs: Not on file  Physical Activity: Not on file  Stress: Not on file  Social Connections: Not on file     Family History: The patient's family history includes Cerebral aneurysm in his father; Heart failure in his mother.  ROS:   Please see the history of present illness.    All other systems reviewed and are negative.  EKGs/Labs/Other Studies Reviewed:    Cardiac  Studies & Procedures   CARDIAC CATHETERIZATION  CARDIAC CATHETERIZATION 01/06/2023  Narrative 1.  Mild to moderate diffuse obstructive coronary artery disease with no high-grade obstructions. 2.  Mean RA pressure of 3, RV pressure of 32/0 with RV end-diastolic pressure of 5 mmHg, mean wedge pressure of 6 mmHg, and mean PA pressure of 12 mmHg with a Fick cardiac output of 8.2 L/min and Fick cardiac index of 4.3 L/min/m.  Recommendation: Continue evaluation for aortic valve intervention.  Findings Coronary Findings Diagnostic  Dominance: Right  Left Anterior Descending There is mild diffuse disease throughout the vessel.  First  Diagonal Branch There is mild disease in the vessel.  Left Circumflex There is moderate diffuse disease throughout the vessel.  Right Coronary Artery There is mild diffuse disease throughout the vessel.  Intervention  No interventions have been documented.     ECHOCARDIOGRAM  ECHOCARDIOGRAM COMPLETE 02/18/2023  Narrative ECHOCARDIOGRAM REPORT    Patient Name:   ANTONY SIAN Date of Exam: 02/18/2023 Medical Rec #:  [id number]     Height:       70.0 in Accession #:    [id number]    Weight:       158.4 lb Date of Birth:  August 03, 1931    BSA:          1.890 m Patient Age:    89 years      BP:           148/66 mmHg Patient Gender: M             HR:           71 bpm. Exam Location:  [address]  Procedure: 2D Echo, Cardiac Doppler, Color Doppler, 3D Echo and Strain Analysis  Indications:    Post TAVR evaluation V43.3 / Z95.2  History:        Patient has prior history of Echocardiogram examinations, most recent 01/19/2023. CHF, CAD, Aortic Valve Disease, Arrythmias:PVC; Risk Factors:Diabetes, Dyslipidemia and Hypertension. Aortic Valve: 26 mm Sapien prosthetic, stented (TAVR) valve is present in the aortic position. Procedure Date: 01/18/23.  Sonographer:    Eulah Pont RDCS Referring Phys: [id number] Wille Celeste Baelyn Doring   Sonographer Comments:  Global longitudinal strain was attempted. IMPRESSIONS   1. Left ventricular ejection fraction, by estimation, is 50 to 55%. Left ventricular ejection fraction by 2D MOD biplane is 49.6 %. The left ventricle has low normal function. The left ventricle demonstrates regional wall motion abnormalities (see scoring diagram/findings for description). There is mild left ventricular hypertrophy. Left ventricular diastolic parameters are consistent with Grade I diastolic dysfunction (impaired relaxation). There is incoordinate septal motion. There is moderate hypokinesis of the left ventricular, basal septal wall. 2. Right ventricular systolic function is low normal. The right ventricular size is normal. There is normal pulmonary artery systolic pressure. The estimated right ventricular systolic pressure is 32.6 mmHg. 3. Left atrial size was mildly dilated. 4. The mitral valve is abnormal. Mild to moderate mitral valve regurgitation. 5. The perivalvular leak is noted adjacent to the RVOT. The aortic valve has been repaired/replaced. Aortic valve regurgitation is trivial. There is a 26 mm Sapien prosthetic (TAVR) valve present in the aortic position. Procedure Date: 01/18/23. Echo findings are consistent with perivalvular leak of the aortic prosthesis. Aortic valve area, by VTI measures 2.59 cm. Aortic valve mean gradient measures 13.7 mmHg. Aortic valve Vmax measures 2.35 m/s. 6. The inferior vena cava is normal in size with greater than 50% respiratory variability, suggesting right atrial pressure of 3 mmHg.  Comparison(s): Changes from prior study are noted. 01/19/2023: LVEF 55-60%, Sapien 3 THV, AOV gradient 10 mmHg mean.  FINDINGS Left Ventricle: Left ventricular ejection fraction, by estimation, is 50 to 55%. Left ventricular ejection fraction by 2D MOD biplane is 49.6 %. The left ventricle has low normal function. The left ventricle demonstrates regional wall motion abnormalities. Moderate hypokinesis of  the left ventricular, basal septal wall. The left ventricular internal cavity size was normal in size. There is mild left ventricular hypertrophy. Incoordinate septal motion. Left ventricular diastolic parameters are consistent with Grade I diastolic dysfunction (impaired relaxation). Indeterminate filling  pressures.  Right Ventricle: The right ventricular size is normal. No increase in right ventricular wall thickness. Right ventricular systolic function is low normal. There is normal pulmonary artery systolic pressure. The tricuspid regurgitant velocity is 2.72 m/s, and with an assumed right atrial pressure of 3 mmHg, the estimated right ventricular systolic pressure is 32.6 mmHg.  Left Atrium: Left atrial size was mildly dilated.  Right Atrium: Right atrial size was normal in size.  Pericardium: There is no evidence of pericardial effusion.  Mitral Valve: The mitral valve is abnormal. There is mild calcification of the posterior and anterior mitral valve leaflet(s). Mild to moderate mitral annular calcification. Mild to moderate mitral valve regurgitation. MV peak gradient, 7.5 mmHg. The mean mitral valve gradient is 2.0 mmHg.  Tricuspid Valve: The tricuspid valve is grossly normal. Tricuspid valve regurgitation is mild.  Aortic Valve: The perivalvular leak is noted adjacent to the RVOT. The aortic valve has been repaired/replaced. Aortic valve regurgitation is trivial. Aortic valve mean gradient measures 13.7 mmHg. Aortic valve peak gradient measures 22.2 mmHg. Aortic valve area, by VTI measures 2.59 cm. There is a 26 mm Sapien prosthetic, stented (TAVR) valve present in the aortic position. Procedure Date: 01/18/23.  Pulmonic Valve: The pulmonic valve was grossly normal. Pulmonic valve regurgitation is trivial.  Aorta: The aortic root and ascending aorta are structurally normal, with no evidence of dilitation.  Venous: The inferior vena cava is normal in size with greater than 50%  respiratory variability, suggesting right atrial pressure of 3 mmHg.  IAS/Shunts: No atrial level shunt detected by color flow Doppler.   LEFT VENTRICLE PLAX 2D                        Biplane EF (MOD) LVIDd:         5.00 cm         LV Biplane EF:   Left LVIDs:         4.00 cm                          ventricular LV PW:         1.20 cm                          ejection LV IVS:        1.20 cm                          fraction by LVOT diam:     2.60 cm                          2D MOD LV SV:         142                              biplane is LV SV Index:   75                               49.6 %. LVOT Area:     5.31 cm Diastology LV e' medial:    4.31 cm/s LV Volumes (MOD)               LV E/e' medial:  16.9 LV vol d, MOD  102.0 ml      LV e' lateral:   6.23 cm/s A2C:                           LV E/e' lateral: 11.7 LV vol d, MOD    135.0 ml A4C: LV vol s, MOD    46.7 ml A2C: LV vol s, MOD    67.2 ml       3D Volume EF: A4C:                           3D EF:        47 % LV SV MOD A2C:   55.3 ml       LV EDV:       175 ml LV SV MOD A4C:   135.0 ml      LV ESV:       93 ml LV SV MOD BP:    58.2 ml       LV SV:        81 ml  RIGHT VENTRICLE RV S prime:     10.60 cm/s TAPSE (M-mode): 2.1 cm  LEFT ATRIUM             Index        RIGHT ATRIUM           Index LA diam:        4.70 cm 2.49 cm/m   RA Area:     12.10 cm LA Vol (A2C):   65.4 ml 34.60 ml/m  RA Volume:   23.50 ml  12.43 ml/m LA Vol (A4C):   61.4 ml 32.48 ml/m LA Biplane Vol: 65.8 ml 34.81 ml/m AORTIC VALVE                     PULMONIC VALVE AV Area (Vmax):    2.80 cm      PR End Diast Vel: 5.11 msec AV Area (Vmean):   2.59 cm AV Area (VTI):     2.59 cm AV Vmax:           235.33 cm/s AV Vmean:          178.000 cm/s AV VTI:            0.550 m AV Peak Grad:      22.2 mmHg AV Mean Grad:      13.7 mmHg LVOT Vmax:         124.00 cm/s LVOT Vmean:        86.900 cm/s LVOT VTI:          0.268 m LVOT/AV VTI ratio:  0.49  AORTA Ao Root diam: 3.30 cm Ao Asc diam:  3.40 cm  MITRAL VALVE                  TRICUSPID VALVE MV Area (PHT): 2.50 cm       TR Peak grad:   29.6 mmHg MV Area VTI:   4.16 cm       TR Vmax:        272.00 cm/s MV Peak grad:  7.5 mmHg MV Mean grad:  2.0 mmHg       SHUNTS MV Vmax:       1.37 m/s       Systemic VTI:  0.27 m MV Vmean:      72.9 cm/s      Systemic Diam: 2.60  cm MV Decel Time: 303 msec MR Peak grad:    140.7 mmHg MR Mean grad:    85.0 mmHg MR Vmax:         593.00 cm/s MR Vmean:        439.0 cm/s MR PISA:         1.01 cm MR PISA Eff ROA: 7 mm MR PISA Radius:  0.40 cm MV E velocity: 72.80 cm/s MV A velocity: 119.00 cm/s MV E/A ratio:  0.61  Zoila Shutter MD Electronically signed by Zoila Shutter MD Signature Date/Time: 02/18/2023/12:10:36 PM    Final     CT SCANS  CT CORONARY MORPH W/CTA COR W/SCORE 12/29/2022  Addendum 12/29/2022  8:51 AM ADDENDUM REPORT: 12/29/2022 08:49  ADDENDUM: Extracardiac findings will be described separately under dictation for contemporaneously obtained CTA chest, abdomen and pelvis dated 12/27/2022. Please see that dictation for full description of relevant extracardiac findings.   Electronically Signed By: Trudie Reed M.D. On: 12/29/2022 08:49  Narrative CLINICAL DATA:  Aortic valve replacement (TAVR), pre-op eval  EXAM: Cardiac TAVR CT  TECHNIQUE: The patient was scanned on a Siemens Force 192 slice scanner. A 120 kV retrospective scan was triggered in the descending thoracic aorta at 111 HU's. Gantry rotation speed was 270 msecs and collimation was .9 mm. The 3D data set was reconstructed in 5% intervals of the R-R cycle. Systolic and diastolic phases were analyzed on a dedicated work station using MPR, MIP and VRT modes. The patient received 95mL OMNIPAQUE IOHEXOL 350 MG/ML SOLN of contrast.  FINDINGS: Aortic Valve:  Tricuspid aortic valve with severely reduced cusp excursion. Severely thickened  and severely calcified aortic valve cusps.  AV calcium score: 2885  Virtual Basal Annulus Measurements:  Maximum/Minimum Diameter: 28.2 x 23.4 mm  Perimeter: 80.5 mm  Area:  504 mm2  Trivial LVOT calcifications.  Membranous septal length: 6 mm  Based on these measurements, the annulus would be suitable for a 26 mm Sapien 3 valve. Alternatively, Heart Team can consider 29 mm Evolut valve. Recommend Heart Team discussion for valve selection.  Sinus of Valsalva Measurements:  Non-coronary:  32 mm  Right - coronary:  31 mm  Left - coronary:  31 mm  Sinus of Valsalva Height:  Left: 19 mm  Right: 21.4 mm  Aorta: Conventional 3 vessel branch pattern of aortic arch. Severe aortic atherosclerosis.  Sinotubular Junction:  32 mm  Ascending Thoracic Aorta:  33 mm  Aortic Arch:  26 mm  Descending Thoracic Aorta:  23 mm  Coronary Artery Height above Annulus: Coronary origins are above the ST junction.  Left main: 21.6 mm  Right coronary: 25 mm  Coronary Arteries: Normal coronary origin. Right dominance. The study was performed without use of NTG and insufficient for plaque evaluation. Coronary artery calcium seen in 3 vessel distribution. Calcium score 3452.  Optimum Fluoroscopic Angle for Delivery: LAO 12, CAU 11  OTHER:  Atria: Left atrial chamber dilation.  Left atrial appendage: No thrombus.  Mitral valve: Grossly normal, mild mitral annular calcifications.  Pulmonary artery: Normal caliber.  Pulmonary veins: Normal variant anatomy (3 on right and 2 on left).  IMPRESSION: 1. 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 Severely thickened and severely calcified aortic valve cusps. 2. Aortic valve calcium score: 2885 3. Annulus area: 504 mm2, suitable for 26 mm Sapien 3 valve. Trivial LVOT calcifications. Membranous septal length 6 mm. 4. Sufficient coronary artery heights from annulus. 5. Optimum fluoroscopic angle for delivery: LAO  12, CAU  11  Electronically Signed: By: Weston Brass M.D. On: 12/27/2022 16:42          EKG:  EKG is NOT ordered today.    Recent Labs: 01/14/2023: ALT 16 01/19/2023: BUN 15; Creatinine, Ser 1.10; Hemoglobin 11.6; Magnesium 1.7; Platelets 140; Potassium 3.5; Sodium 133  Recent Lipid Panel    Component Value Date/Time   CHOL 213 (H) 04/29/2011 0259   TRIG 182 (H) 04/29/2011 0259   HDL 40 04/29/2011 0259   CHOLHDL 5.3 04/29/2011 0259   VLDL 36 04/29/2011 0259   LDLCALC 137 (H) 04/29/2011 0259     Risk Assessment/Calculations:       Physical Exam:    VS:  BP (!) 152/70   Pulse (!) 57   Ht 5\' 10"  (1.778 m)   Wt 161 lb (73 kg)   SpO2 97%   BMI 23.10 kg/m     Wt Readings from Last 3 Encounters:  03/04/23 161 lb (73 kg)  02/18/23 162 lb 9.6 oz (73.8 kg)  01/26/23 158 lb 6.4 oz (71.8 kg)     GEN:  Well nourished, well developed in no acute distress HEENT: Normal NECK: No JVD LYMPHATICS: No lymphadenopathy CARDIAC: RRR, no murmurs, rubs, gallops RESPIRATORY:  Clear to auscultation without rales, wheezing or rhonchi  ABDOMEN: Soft, non-tender, non-distended MUSCULOSKELETAL:  1+ bilateral LE edema; No deformity  SKIN: Warm and dry.  Groin sites clear without hematoma or ecchymosis  NEUROLOGIC:  Alert and oriented x 3 PSYCHIATRIC:  Normal affect   ASSESSMENT:    1. S/P TAVR (transcatheter aortic valve replacement)   2. HFrEF (heart failure with reduced ejection fraction) (HCC)   3. Hypertension, unspecified type   4. Lesion of pancreas     PLAN:    In order of problems listed above:  Severe AS s/p TAVR: stable by most recent echo. He has amoxicillin for SBE prophylaxis. Continue Asprin 81mg  daily alone. Will see him back for 1 year TAVR eval/echo  HFrEF: EF improved to 50% after TAVR. Continue Losartan-HCTZ. Add back Coreg 6.5mg  BID today. Had itching with spiro. Has some mild LE edema but stable. Volume status looks good.    HTN: BP 152/70 today, which is  improved from previous. Reviewed home log which is still mildly elevated with an average BP ~145/70. Continue Losartan-HCT 100-25mg  daily, Norvasc 10mg  daily and add back Coreg 6.25mg  BID given no change in LE weakness when discontinued. I think this should get him to goal.    Pancreas lesion: pre TAVR CT showed a "low-attenuation lesions in the body and tail of the pancreas measuring up to 9 x 6 mm, nonspecific, but statistically likely benign lesions such as small side branch IPMN (intraductal papillary mucinous neoplasm). Follow-up abdominal MRI with and without IV gadolinium or pancreatic protocol CT scan should be considered in 2 years to ensure the stability of these findings. This was printed out for him to discuss with his PCP, Dr. Kennith Center.    Medication Adjustments/Labs and Tests Ordered: Current medicines are reviewed at length with the patient today.  Concerns regarding medicines are outlined above.  No orders of the defined types were placed in this encounter.  No orders of the defined types were placed in this encounter.   Patient Instructions  Medication Instructions:  Your physician has recommended you make the following change in your medication:  RESTART COREG 6.25 MG TWICE DAILY. *If you need a refill on your cardiac medications before your next appointment, please call your pharmacy*  Lab Work: NONE If you have labs (blood work) drawn today and your tests are completely normal, you will receive your results only by: MyChart Message (if you have MyChart) OR A paper copy in the mail If you have any lab test that is abnormal or we need to change your treatment, we will call you to review the results.   Testing/Procedures: NONE   Follow-Up: At [facility], you and your health needs are our priority.  As part of our continuing mission to provide you with exceptional heart care, we have created designated Provider Care Teams.  These Care Teams include your  primary Cardiologist (physician) and Advanced Practice Providers (APPs -  Physician Assistants and Nurse Practitioners) who all work together to provide you with the care you need, when you need it.  We recommend signing up for the patient portal called "MyChart".  Sign up information is provided on this After Visit Summary.  MyChart is used to connect with patients for Virtual Visits (Telemedicine).  Patients are able to view lab/test results, encounter notes, upcoming appointments, etc.  Non-urgent messages can be sent to your provider as well.   To learn more about what you can do with MyChart, go to [email or web address].    Your next appointment:   KEEP SCHEDULE FOLLOW-UP   Signed, [name], PA-C  03/04/2023 10:15 AM    [facility]

## 2023-03-09 ENCOUNTER — Telehealth: Payer: Self-pay | Admitting: Internal Medicine

## 2023-03-09 NOTE — Telephone Encounter (Signed)
Pt c/o medication issue:  1. Name of Medication: amLODipine (NORVASC) 10 MG tablet  carvedilol (COREG) 6.25 MG tablet   2. How are you currently taking this medication (dosage and times per day)?   3. Are you having a reaction (difficulty breathing--STAT)?   4. What is your medication issue? Patient's wife called stating her husband broke out in a rash with blisters, all across his face, head and ears. It was itchy.  He stopped taking the medication.  She states the rash is drying up now.  He restarted taking the losartan and aspirin again on Saturday.   Wife states they have been checking his BP and it has been great.   BP readings: 6/23 141/80  77 6/25 130/62 60          129/61 62

## 2023-03-09 NOTE — Telephone Encounter (Signed)
Spoke to the pt wife [name], explained [name], [place] recommendation:  Lets have him go back on the amlodipine 10mg  daily and stop the carvedilol. BPs look okay for now. He should continue to monitor them.   Ms. [name], [place] voiced understanding.

## 2023-03-09 NOTE — Telephone Encounter (Signed)
Spoke to the wife Omar Haley [facility]), pt had OV on 6/21 was advised to restarted restart carvedilol however, Friday night pt developed painful rash all over his body denied any other symptoms. Pt has d/c'd amlodipine and carvedilol on 6/22. Pt wife stated his rash is "getting a lot better", he is using a cream to treat the rash  however, she does not know the name of he cream. Pt has restarted losartan-potassium 100-25 mg and aspirin 81 mg on 6/22. Pt does monitor bp and currently asymptomatic. Will forward to APP for advise.     Blood pressure      6/25 130/62 HR 60 129/61 HR 62          6/24 142/79 HR 72        6/23 141/80 HR 77

## 2023-04-12 NOTE — Progress Notes (Unsigned)
Cardiology Office Note:   Date:  04/13/2023  ID:  Omar Haley, DOB 1931/08/25, MRN [id number] PCP:  Alysia Penna, MD  [facility] Providers Cardiologist:  Alverda Skeans, MD Referring MD: Alysia Penna, MD   Chief Complaint/Reason for Referral:  Cardiology follow up ASSESSMENT:    1. S/P TAVR (transcatheter aortic valve replacement)   2. HFrEF (heart failure with reduced ejection fraction) (HCC)   3. Type 2 diabetes mellitus without complication, without long-term current use of insulin (HCC)   4. Hypertension associated with diabetes (HCC)   5. Hyperlipidemia associated with type 2 diabetes mellitus (HCC)   6. Aortic atherosclerosis (HCC)   7. LBBB (left bundle branch block)     PLAN:   In order of problems listed above: 1.  Aortic stenosis status post TAVR: Continue aspirin 81 mg indefinitely. 2.  Heart failure with reduced ejection fraction: Last echocardiogram demonstrated ejection fraction of 55%.  The patient is not on Coreg due to a rash; will restart losartan 25 mg daily.  He has some mild lower extremity edema which is improved after sleep.  We could consider stopping amlodipine in the future if needed. 3.  Type 2 diabetes: Continue aspirin and Hyzaar.  Given advanced age, will defer statin. 4.  Hypertension: Restart Coreg and start losartan 25 mg.  Patient had previously been on Hyzaar but this was no longer covered by his insurance. 5.  Hyperlipidemia: See discussion above. 6.  Aortic atherosclerosis: Continue aspirin 81 mg. 7.  Left bundle branch block: Ejection fraction is now normal.  Monitor for now.             Dispo:  Return in about 6 months (around 10/14/2023).      Medication Adjustments/Labs and Tests Ordered: Current medicines are reviewed at length with the patient today.  Concerns regarding medicines are outlined above.  The following changes have been made:     Labs/tests ordered: No orders of the defined types were placed in this  encounter.   Medication Changes: Meds ordered this encounter  Medications   losartan (COZAAR) 25 MG tablet    Sig: Take 1 tablet (25 mg total) by mouth at bedtime.    Dispense:  90 tablet    Refill:  3    Current medicines are reviewed at length with the patient today.  The patient does not have concerns regarding medicines.  History of Present Illness:   FOCUSED CARDIOVASCULAR PROBLEM LIST:   1.  Prostate cancer 2.  Diet-controlled diabetes 3.  Hypertension 4.  Hypothyroidism 5.  Hyperlipidemia 6.  Aortic atherosclerosis on CT scan 2021 7.  Severe aortic stenosis s/p 26S3 TAVR May 2024 8.  LBBB     HISTORY OF PRESENT ILLNESS:   March 2024 consultation:  The patient is a 89 y.o. male with the indicated medical history here for recommendations regarding chest pain, presyncope, and incidentally noted systolic murmur.  The patient was seen by his primary care provider recently.  He noted that when he was mowing his lawn he developed some chest discomfort.  He also noted apparently had a presyncopal episode when walking to the mailbox.  For this reason he was referred for cardiology evaluation.   The patient tells me that over the last few months he has noticed increasing dyspnea and occasional right-sided chest discomfort when he does more than moderate exertion such as mowing his lawn.  Also going up stairs sometimes causes.  He has noted some lightheadedness but no frank syncope as well.  None of this happens at rest.  He has had some mild dependent edema which is better after night sleep.  He denies any significant bleeding or bruising.  He has never had a stroke.  He does not smoke.   He sees a Education officer, community on a regular basis.  Plan: Obtain TTE.   April 2024:  The patient returns today to discuss the findings of his echocardiogram with confirmed severe aortic stenosis with moderate LV dysfunction.  He is here with his son.  He endorses NYHA class II symptoms of shortness of breath.  He  has had some mild lightheadedness at times.  He sometimes gets a chest heaviness with exertion as well.  This does not happen at rest.  He fortunately has not had any syncopal episodes.  He is required no emergency room visits or hospitalizations.  He is unable to do what he could do last summer including mowing his lawn.  He would like to feel better.  Plan: Proceed with aortic valve intervention evaluation.  Today:  In the interim, he underwent uncomplicated TAVR in May.  His ejection fraction was improved to 55%.  He had seen Carlean Jews a few times after his procedure.  Because of lower extremity weakness his Coreg was stopped and amlodipine started.  There is really no change in his lower extremity weakness and his Coreg was restarted however the patient did not restart this medication.  He tells me that his lower extremity weakness is predated his TAVR procedure and occurred after a prostate procedure.  Interestingly he is no longer short of breath.  Prior to to TAVR he really did not develop lower extremity weakness.  It is likely that given that his dyspnea was limiting in the past his lower extremity weakness has now become more of an issue.  He denies any chest pain, presyncope, or syncope.  He has had no severe bleeding or bruising while on aspirin monotherapy.  He has required no emergency room visits or hospitalizations.       Previous Medical History: Past Medical History:  Diagnosis Date   Aortic stenosis, severe    B12 deficiency    Cancer (HCC)    melanoma   CHF (congestive heart failure) (HCC)    HFrEF 35-40% 12/14/2022   Coronary artery disease    01/06/23 LHC: Mild-moderate CAD, medical therapy   Diabetes mellitus without complication (HCC)    DJD (degenerative joint disease)    Dupuytren's contracture    Glaucoma    Hemorrhoids    History of shingles    Hyperlipidemia    Hypertension    Neuropathy    Prostatitis    S/P TAVR (transcatheter aortic valve replacement)  01/18/2023   s/p TAVR with a 26 mm Edwards S3UR via the TF approach by Dr. Lynnette Caffey & Dr. Laneta Simmers   Unifocal PVCs    Vitamin D deficiency      Current Medications: Current Meds  Medication Sig   acetaminophen (TYLENOL) 650 MG CR tablet Take 650 mg by mouth every 8 (eight) hours as needed for pain.   amLODipine (NORVASC) 10 MG tablet Take 1 tablet (10 mg total) by mouth daily.   amoxicillin (AMOXIL) 500 MG tablet Take 4 tablets by mouth 1 hour prior to dental procedures and cleanings.   Aspirin 81 MG EC tablet Take 81 mg by mouth daily.   ibuprofen (ADVIL,MOTRIN) 200 MG tablet Take 200 mg by mouth every 4 (four) hours as needed for pain, mild pain or moderate  pain.   latanoprost (XALATAN) 0.005 % ophthalmic solution Place 1 drop into both eyes at bedtime.   levothyroxine (SYNTHROID) 50 MCG tablet Take 50 mcg by mouth daily before breakfast.   losartan (COZAAR) 25 MG tablet Take 1 tablet (25 mg total) by mouth at bedtime.     Allergies:    Enalapril, Gemfibrozil, Micardis [telmisartan], Spironolactone, Zetia [ezetimibe], and Coreg [carvedilol]   Social History:   Social History   Tobacco Use   Smoking status: Never  Substance Use Topics   Alcohol use: No   Drug use: No     Family Hx: Family History  Problem Relation Age of Onset   Heart failure Mother    Cerebral aneurysm Father      Review of Systems:   Please see the history of present illness.    All other systems reviewed and are negative.     EKGs/Labs/Other Test Reviewed:   EKG:    EKG Interpretation Date/Time:    Ventricular Rate:    PR Interval:    QRS Duration:    QT Interval:    QTC Calculation:   R Axis:      Text Interpretation:          Prior CV studies reviewed: Cardiac Studies & Procedures   CARDIAC CATHETERIZATION  CARDIAC CATHETERIZATION 01/06/2023  Narrative 1.  Mild to moderate diffuse obstructive coronary artery disease with no high-grade obstructions. 2.  Mean RA pressure of 3,  RV pressure of 32/0 with RV end-diastolic pressure of 5 mmHg, mean wedge pressure of 6 mmHg, and mean PA pressure of 12 mmHg with a Fick cardiac output of 8.2 L/min and Fick cardiac index of 4.3 L/min/m.  Recommendation: Continue evaluation for aortic valve intervention.  Findings Coronary Findings Diagnostic  Dominance: Right  Left Anterior Descending There is mild diffuse disease throughout the vessel.  First Diagonal Branch There is mild disease in the vessel.  Left Circumflex There is moderate diffuse disease throughout the vessel.  Right Coronary Artery There is mild diffuse disease throughout the vessel.  Intervention  No interventions have been documented.     ECHOCARDIOGRAM  ECHOCARDIOGRAM COMPLETE 02/18/2023  Narrative ECHOCARDIOGRAM REPORT    Patient Name:   JADON MITTER Date of Exam: 02/18/2023 Medical Rec #:  [id number]     Height:       70.0 in Accession #:    [id number]    Weight:       158.4 lb Date of Birth:  [birth year]    BSA:          1.890 m Patient Age:    89 years      BP:           148/66 mmHg Patient Gender: M             HR:           71 bpm. Exam Location:  [address]  Procedure: 2D Echo, Cardiac Doppler, Color Doppler, 3D Echo and Strain Analysis  Indications:    Post TAVR evaluation V43.3 / Z95.2  History:        Patient has prior history of Echocardiogram examinations, most recent 01/19/2023. CHF, CAD, Aortic Valve Disease, Arrythmias:PVC; Risk Factors:Diabetes, Dyslipidemia and Hypertension. Aortic Valve: 26 mm Sapien prosthetic, stented (TAVR) valve is present in the aortic position. Procedure Date: 01/18/23.  Sonographer:    Eulah Pont RDCS Referring Phys: [id number] Wille Celeste THOMPSON   Sonographer Comments: Global longitudinal strain was attempted. IMPRESSIONS   1. Left  ventricular ejection fraction, by estimation, is 50 to 55%. Left ventricular ejection fraction by 2D MOD biplane is 49.6 %. The left ventricle has low normal  function. The left ventricle demonstrates regional wall motion abnormalities (see scoring diagram/findings for description). There is mild left ventricular hypertrophy. Left ventricular diastolic parameters are consistent with Grade I diastolic dysfunction (impaired relaxation). There is incoordinate septal motion. There is moderate hypokinesis of the left ventricular, basal septal wall. 2. Right ventricular systolic function is low normal. The right ventricular size is normal. There is normal pulmonary artery systolic pressure. The estimated right ventricular systolic pressure is 32.6 mmHg. 3. Left atrial size was mildly dilated. 4. The mitral valve is abnormal. Mild to moderate mitral valve regurgitation. 5. The perivalvular leak is noted adjacent to the RVOT. The aortic valve has been repaired/replaced. Aortic valve regurgitation is trivial. There is a 26 mm Sapien prosthetic (TAVR) valve present in the aortic position. Procedure Date: 01/18/23. Echo findings are consistent with perivalvular leak of the aortic prosthesis. Aortic valve area, by VTI measures 2.59 cm. Aortic valve mean gradient measures 13.7 mmHg. Aortic valve Vmax measures 2.35 m/s. 6. The inferior vena cava is normal in size with greater than 50% respiratory variability, suggesting right atrial pressure of 3 mmHg.  Comparison(s): Changes from prior study are noted. 01/19/2023: LVEF 55-60%, Sapien 3 THV, AOV gradient 10 mmHg mean.  FINDINGS Left Ventricle: Left ventricular ejection fraction, by estimation, is 50 to 55%. Left ventricular ejection fraction by 2D MOD biplane is 49.6 %. The left ventricle has low normal function. The left ventricle demonstrates regional wall motion abnormalities. Moderate hypokinesis of the left ventricular, basal septal wall. The left ventricular internal cavity size was normal in size. There is mild left ventricular hypertrophy. Incoordinate septal motion. Left ventricular diastolic parameters are  consistent with Grade I diastolic dysfunction (impaired relaxation). Indeterminate filling pressures.  Right Ventricle: The right ventricular size is normal. No increase in right ventricular wall thickness. Right ventricular systolic function is low normal. There is normal pulmonary artery systolic pressure. The tricuspid regurgitant velocity is 2.72 m/s, and with an assumed right atrial pressure of 3 mmHg, the estimated right ventricular systolic pressure is 32.6 mmHg.  Left Atrium: Left atrial size was mildly dilated.  Right Atrium: Right atrial size was normal in size.  Pericardium: There is no evidence of pericardial effusion.  Mitral Valve: The mitral valve is abnormal. There is mild calcification of the posterior and anterior mitral valve leaflet(s). Mild to moderate mitral annular calcification. Mild to moderate mitral valve regurgitation. MV peak gradient, 7.5 mmHg. The mean mitral valve gradient is 2.0 mmHg.  Tricuspid Valve: The tricuspid valve is grossly normal. Tricuspid valve regurgitation is mild.  Aortic Valve: The perivalvular leak is noted adjacent to the RVOT. The aortic valve has been repaired/replaced. Aortic valve regurgitation is trivial. Aortic valve mean gradient measures 13.7 mmHg. Aortic valve peak gradient measures 22.2 mmHg. Aortic valve area, by VTI measures 2.59 cm. There is a 26 mm Sapien prosthetic, stented (TAVR) valve present in the aortic position. Procedure Date: 01/18/23.  Pulmonic Valve: The pulmonic valve was grossly normal. Pulmonic valve regurgitation is trivial.  Aorta: The aortic root and ascending aorta are structurally normal, with no evidence of dilitation.  Venous: The inferior vena cava is normal in size with greater than 50% respiratory variability, suggesting right atrial pressure of 3 mmHg.  IAS/Shunts: No atrial level shunt detected by color flow Doppler.   LEFT VENTRICLE PLAX 2D  Biplane EF (MOD) LVIDd:          5.00 cm         LV Biplane EF:   Left LVIDs:         4.00 cm                          ventricular LV PW:         1.20 cm                          ejection LV IVS:        1.20 cm                          fraction by LVOT diam:     2.60 cm                          2D MOD LV SV:         142                              biplane is LV SV Index:   75                               49.6 %. LVOT Area:     5.31 cm Diastology LV e' medial:    4.31 cm/s LV Volumes (MOD)               LV E/e' medial:  16.9 LV vol d, MOD    102.0 ml      LV e' lateral:   6.23 cm/s A2C:                           LV E/e' lateral: 11.7 LV vol d, MOD    135.0 ml A4C: LV vol s, MOD    46.7 ml A2C: LV vol s, MOD    67.2 ml       3D Volume EF: A4C:                           3D EF:        47 % LV SV MOD A2C:   55.3 ml       LV EDV:       175 ml LV SV MOD A4C:   135.0 ml      LV ESV:       93 ml LV SV MOD BP:    58.2 ml       LV SV:        81 ml  RIGHT VENTRICLE RV S prime:     10.60 cm/s TAPSE (M-mode): 2.1 cm  LEFT ATRIUM             Index        RIGHT ATRIUM           Index LA diam:        4.70 cm 2.49 cm/m   RA Area:     12.10 cm LA Vol (A2C):   65.4 ml 34.60 ml/m  RA Volume:   23.50 ml  12.43 ml/m LA Vol (A4C):   61.4 ml 32.48 ml/m  LA Biplane Vol: 65.8 ml 34.81 ml/m AORTIC VALVE                     PULMONIC VALVE AV Area (Vmax):    2.80 cm      PR End Diast Vel: 5.11 msec AV Area (Vmean):   2.59 cm AV Area (VTI):     2.59 cm AV Vmax:           235.33 cm/s AV Vmean:          178.000 cm/s AV VTI:            0.550 m AV Peak Grad:      22.2 mmHg AV Mean Grad:      13.7 mmHg LVOT Vmax:         124.00 cm/s LVOT Vmean:        86.900 cm/s LVOT VTI:          0.268 m LVOT/AV VTI ratio: 0.49  AORTA Ao Root diam: 3.30 cm Ao Asc diam:  3.40 cm  MITRAL VALVE                  TRICUSPID VALVE MV Area (PHT): 2.50 cm       TR Peak grad:   29.6 mmHg MV Area VTI:   4.16 cm       TR Vmax:        272.00  cm/s MV Peak grad:  7.5 mmHg MV Mean grad:  2.0 mmHg       SHUNTS MV Vmax:       1.37 m/s       Systemic VTI:  0.27 m MV Vmean:      72.9 cm/s      Systemic Diam: 2.60 cm MV Decel Time: 303 msec MR Peak grad:    140.7 mmHg MR Mean grad:    85.0 mmHg MR Vmax:         593.00 cm/s MR Vmean:        439.0 cm/s MR PISA:         1.01 cm MR PISA Eff ROA: 7 mm MR PISA Radius:  0.40 cm MV E velocity: 72.80 cm/s MV A velocity: 119.00 cm/s MV E/A ratio:  0.61  Zoila Shutter MD Electronically signed by Zoila Shutter MD Signature Date/Time: 02/18/2023/12:10:36 PM    Final     CT SCANS  CT CORONARY MORPH W/CTA COR W/SCORE 12/27/2022  Addendum 12/29/2022  8:51 AM ADDENDUM REPORT: 12/29/2022 08:49  ADDENDUM: Extracardiac findings will be described separately under dictation for contemporaneously obtained CTA chest, abdomen and pelvis dated 12/27/2022. Please see that dictation for full description of relevant extracardiac findings.   Electronically Signed By: Trudie Reed M.D. On: 12/29/2022 08:49  Narrative CLINICAL DATA:  Aortic valve replacement (TAVR), pre-op eval  EXAM: Cardiac TAVR CT  TECHNIQUE: The patient was scanned on a Siemens Force 192 slice scanner. A 120 kV retrospective scan was triggered in the descending thoracic aorta at 111 HU's. Gantry rotation speed was 270 msecs and collimation was .9 mm. The 3D data set was reconstructed in 5% intervals of the R-R cycle. Systolic and diastolic phases were analyzed on a dedicated work station using MPR, MIP and VRT modes. The patient received 95mL OMNIPAQUE IOHEXOL 350 MG/ML SOLN of contrast.  FINDINGS: Aortic Valve:  Tricuspid aortic valve with severely reduced cusp excursion. Severely thickened and severely calcified aortic valve cusps.  AV calcium score: 2885  Virtual Basal Annulus Measurements:  Maximum/Minimum Diameter: 28.2 x 23.4 mm  Perimeter: 80.5 mm  Area:  504 mm2  Trivial LVOT  calcifications.  Membranous septal length: 6 mm  Based on these measurements, the annulus would be suitable for a 26 mm Sapien 3 valve. Alternatively, Heart Team can consider 29 mm Evolut valve. Recommend Heart Team discussion for valve selection.  Sinus of Valsalva Measurements:  Non-coronary:  32 mm  Right - coronary:  31 mm  Left - coronary:  31 mm  Sinus of Valsalva Height:  Left: 19 mm  Right: 21.4 mm  Aorta: Conventional 3 vessel branch pattern of aortic arch. Severe aortic atherosclerosis.  Sinotubular Junction:  32 mm  Ascending Thoracic Aorta:  33 mm  Aortic Arch:  26 mm  Descending Thoracic Aorta:  23 mm  Coronary Artery Height above Annulus: Coronary origins are above the ST junction.  Left main: 21.6 mm  Right coronary: 25 mm  Coronary Arteries: Normal coronary origin. Right dominance. The study was performed without use of NTG and insufficient for plaque evaluation. Coronary artery calcium seen in 3 vessel distribution. Calcium score 3452.  Optimum Fluoroscopic Angle for Delivery: LAO 12, CAU 11  OTHER:  Atria: Left atrial chamber dilation.  Left atrial appendage: No thrombus.  Mitral valve: Grossly normal, mild mitral annular calcifications.  Pulmonary artery: Normal caliber.  Pulmonary veins: Normal variant anatomy (3 on right and 2 on left).  IMPRESSION: 1. 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 Severely thickened and severely calcified aortic valve cusps. 2. Aortic valve calcium score: 2885 3. Annulus area: 504 mm2, suitable for 26 mm Sapien 3 valve. Trivial LVOT calcifications. Membranous septal length 6 mm. 4. Sufficient coronary artery heights from annulus. 5. Optimum fluoroscopic angle for delivery: LAO 12, CAU 11  Electronically Signed: By: Weston Brass M.D. On: 12/27/2022 16:42          Other studies Reviewed: None relevant  Recent Labs: 01/14/2023: ALT 16 01/19/2023: BUN 15; Creatinine, Ser  1.10; Hemoglobin 11.6; Magnesium 1.7; Platelets 140; Potassium 3.5; Sodium 133   Lipid Panel    Component Value Date/Time   CHOL 213 (H) 04/29/2011 0259   TRIG 182 (H) 04/29/2011 0259   HDL 40 04/29/2011 0259   CHOLHDL 5.3 04/29/2011 0259   VLDL 36 04/29/2011 0259   LDLCALC 137 (H) 04/29/2011 0259    Risk Assessment/Calculations:          Physical Exam:   VS:  BP 135/80   Pulse 78   Ht 5\' 10"  (1.778 m)   Wt 161 lb (73 kg)   SpO2 98%   BMI 23.10 kg/m        Wt Readings from Last 3 Encounters:  04/13/23 161 lb (73 kg)  03/04/23 161 lb (73 kg)  02/18/23 162 lb 9.6 oz (73.8 kg)      GENERAL:  No apparent distress, AOx3 HEENT:  No carotid bruits, +2 carotid impulses, no scleral icterus CAR: RRR with flow murmur, no gallops, rubs, or thrills RES:  Clear to auscultation bilaterally ABD:  Soft, nontender, nondistended, positive bowel sounds x 4 VASC:  +2 radial pulses, +2 carotid pulses, palpable pedal pulses NEURO:  CN 2-12 grossly intact; motor and sensory grossly intact PSYCH:  No active depression or anxiety EXT: Trace right-sided lower extremity edema and +1 lower extremity edema left side, ecchymosis, or cyanosis  Signed, [name], MD  04/13/2023 3:06 PM    [facility] [address] [place], [place], [place]  [address] Phone: [phone]; Fax: [phone]   Note:  This document was prepared using Dragon voice recognition software and may include unintentional dictation errors.

## 2023-04-13 ENCOUNTER — Encounter: Payer: Self-pay | Admitting: Internal Medicine

## 2023-04-13 ENCOUNTER — Ambulatory Visit: Payer: Medicare Other | Attending: Internal Medicine | Admitting: Internal Medicine

## 2023-04-13 VITALS — BP 135/80 | HR 78 | Ht 70.0 in | Wt 161.0 lb

## 2023-04-13 DIAGNOSIS — E119 Type 2 diabetes mellitus without complications: Secondary | ICD-10-CM | POA: Insufficient documentation

## 2023-04-13 DIAGNOSIS — E785 Hyperlipidemia, unspecified: Secondary | ICD-10-CM | POA: Insufficient documentation

## 2023-04-13 DIAGNOSIS — I152 Hypertension secondary to endocrine disorders: Secondary | ICD-10-CM | POA: Insufficient documentation

## 2023-04-13 DIAGNOSIS — Z952 Presence of prosthetic heart valve: Secondary | ICD-10-CM | POA: Diagnosis not present

## 2023-04-13 DIAGNOSIS — I447 Left bundle-branch block, unspecified: Secondary | ICD-10-CM | POA: Insufficient documentation

## 2023-04-13 DIAGNOSIS — I700 Atherosclerosis of aorta: Secondary | ICD-10-CM | POA: Diagnosis not present

## 2023-04-13 DIAGNOSIS — E1169 Type 2 diabetes mellitus with other specified complication: Secondary | ICD-10-CM | POA: Diagnosis not present

## 2023-04-13 DIAGNOSIS — E1159 Type 2 diabetes mellitus with other circulatory complications: Secondary | ICD-10-CM | POA: Diagnosis not present

## 2023-04-13 DIAGNOSIS — I5020 Unspecified systolic (congestive) heart failure: Secondary | ICD-10-CM | POA: Insufficient documentation

## 2023-04-13 MED ORDER — LOSARTAN POTASSIUM 25 MG PO TABS: 25.0000 mg | ORAL_TABLET | Freq: Every day | ORAL | 3 refills | Status: DC

## 2023-04-13 NOTE — Patient Instructions (Addendum)
Medication Instructions:  Your physician has recommended you make the following change in your medication:   1.) start losartan 25 mg - one tablet daily at bedtime 3.) stay off HYZAAR  *If you need a refill on your cardiac medications before your next appointment, please call your pharmacy*   Lab Work: none   Testing/Procedures: none   Follow-Up: At [facility], you and your health needs are our priority.  As part of our continuing mission to provide you with exceptional heart care, we have created designated Provider Care Teams.  These Care Teams include your primary Cardiologist (physician) and Advanced Practice Providers (APPs -  Physician Assistants and Nurse Practitioners) who all work together to provide you with the care you need, when you need it.  We recommend signing up for the patient portal called "MyChart".  Sign up information is provided on this After Visit Summary.  MyChart is used to connect with patients for Virtual Visits (Telemedicine).  Patients are able to view lab/test results, encounter notes, upcoming appointments, etc.  Non-urgent messages can be sent to your provider as well.   To learn more about what you can do with MyChart, go to [email or web address].    Your next appointment:   6 month(s)  Provider:   Alverda Skeans, MD

## 2023-05-17 DIAGNOSIS — Z23 Encounter for immunization: Secondary | ICD-10-CM | POA: Diagnosis not present

## 2023-05-31 DIAGNOSIS — E039 Hypothyroidism, unspecified: Secondary | ICD-10-CM | POA: Diagnosis not present

## 2023-05-31 DIAGNOSIS — K869 Disease of pancreas, unspecified: Secondary | ICD-10-CM | POA: Diagnosis not present

## 2023-05-31 DIAGNOSIS — Z952 Presence of prosthetic heart valve: Secondary | ICD-10-CM | POA: Diagnosis not present

## 2023-05-31 DIAGNOSIS — E785 Hyperlipidemia, unspecified: Secondary | ICD-10-CM | POA: Diagnosis not present

## 2023-05-31 DIAGNOSIS — R600 Localized edema: Secondary | ICD-10-CM | POA: Diagnosis not present

## 2023-05-31 DIAGNOSIS — E1169 Type 2 diabetes mellitus with other specified complication: Secondary | ICD-10-CM | POA: Diagnosis not present

## 2023-05-31 DIAGNOSIS — I350 Nonrheumatic aortic (valve) stenosis: Secondary | ICD-10-CM | POA: Diagnosis not present

## 2023-05-31 DIAGNOSIS — I110 Hypertensive heart disease with heart failure: Secondary | ICD-10-CM | POA: Diagnosis not present

## 2023-05-31 DIAGNOSIS — C61 Malignant neoplasm of prostate: Secondary | ICD-10-CM | POA: Diagnosis not present

## 2023-05-31 DIAGNOSIS — I5022 Chronic systolic (congestive) heart failure: Secondary | ICD-10-CM | POA: Diagnosis not present

## 2023-05-31 DIAGNOSIS — D692 Other nonthrombocytopenic purpura: Secondary | ICD-10-CM | POA: Diagnosis not present

## 2023-05-31 DIAGNOSIS — Z23 Encounter for immunization: Secondary | ICD-10-CM | POA: Diagnosis not present

## 2023-06-07 DIAGNOSIS — Z23 Encounter for immunization: Secondary | ICD-10-CM | POA: Diagnosis not present

## 2023-06-22 DIAGNOSIS — C61 Malignant neoplasm of prostate: Secondary | ICD-10-CM | POA: Diagnosis not present

## 2023-06-28 ENCOUNTER — Ambulatory Visit: Payer: Medicare Other | Admitting: Internal Medicine

## 2023-06-29 DIAGNOSIS — C61 Malignant neoplasm of prostate: Secondary | ICD-10-CM | POA: Diagnosis not present

## 2023-06-29 DIAGNOSIS — N400 Enlarged prostate without lower urinary tract symptoms: Secondary | ICD-10-CM | POA: Diagnosis not present

## 2023-07-05 DIAGNOSIS — C61 Malignant neoplasm of prostate: Secondary | ICD-10-CM | POA: Diagnosis not present

## 2023-07-05 DIAGNOSIS — I110 Hypertensive heart disease with heart failure: Secondary | ICD-10-CM | POA: Diagnosis not present

## 2023-07-05 DIAGNOSIS — M25551 Pain in right hip: Secondary | ICD-10-CM | POA: Diagnosis not present

## 2023-08-03 DIAGNOSIS — M7918 Myalgia, other site: Secondary | ICD-10-CM | POA: Diagnosis not present

## 2023-08-03 DIAGNOSIS — M25552 Pain in left hip: Secondary | ICD-10-CM | POA: Diagnosis not present

## 2023-08-03 DIAGNOSIS — M47896 Other spondylosis, lumbar region: Secondary | ICD-10-CM | POA: Diagnosis not present

## 2023-08-10 DIAGNOSIS — M533 Sacrococcygeal disorders, not elsewhere classified: Secondary | ICD-10-CM | POA: Diagnosis not present

## 2023-08-15 ENCOUNTER — Encounter (HOSPITAL_COMMUNITY): Payer: Self-pay

## 2023-08-15 ENCOUNTER — Emergency Department (HOSPITAL_COMMUNITY): Payer: Medicare Other

## 2023-08-15 ENCOUNTER — Emergency Department (HOSPITAL_COMMUNITY)
Admission: EM | Admit: 2023-08-15 | Discharge: 2023-08-15 | Disposition: A | Discharge status: Home / Self Care | Payer: Medicare Other | Attending: Emergency Medicine | Admitting: Emergency Medicine

## 2023-08-15 ENCOUNTER — Other Ambulatory Visit: Payer: Self-pay

## 2023-08-15 DIAGNOSIS — Z7982 Long term (current) use of aspirin: Secondary | ICD-10-CM | POA: Insufficient documentation

## 2023-08-15 DIAGNOSIS — G8929 Other chronic pain: Secondary | ICD-10-CM | POA: Insufficient documentation

## 2023-08-15 DIAGNOSIS — M545 Low back pain, unspecified: Secondary | ICD-10-CM | POA: Insufficient documentation

## 2023-08-15 DIAGNOSIS — M25552 Pain in left hip: Secondary | ICD-10-CM | POA: Diagnosis not present

## 2023-08-15 DIAGNOSIS — R102 Pelvic and perineal pain: Secondary | ICD-10-CM | POA: Diagnosis not present

## 2023-08-15 MED ORDER — HYDROCODONE-ACETAMINOPHEN 5-325 MG PO TABS: 2.0000 | ORAL_TABLET | ORAL | 0 refills | Status: DC | PRN

## 2023-08-15 NOTE — Discharge Instructions (Addendum)
You were seen today for low back pain due to degenerative changes. The best way to manage this is to follow-up with Ortho and/or PCP to get scheduled for PT and further pain management.  Both Tylenol and ibuprofen are over-the-counter medications you can pick up at any pharmacy. Take Ibuprofen 600mg  mg every 4-6 hours for pain or fever, not exceeding 3,200 mg per day as more than 3,200mg  can cause Stomach irritation, dizziness, kidney issues with long-term use.  Take Tylenol (acetominophen)  500 mg every 4-6 hours, as needed for pain or fever. Do not take more than 3,600 mg in a 24-hour period. As this may cause liver damage. While this is rare, if you begin to develop yellowing of the skin or eyes, stop taking and return to ER immediately.  I have also sent and some Norco which is a narcotic I talked about.  This also contains 325 mg of Tylenol so be prepared to calculate that and with your over-the-counter Tylenol intake.  Be sure to take a laxative such as MiraLAX with this if you begin to experience any signs of constipation.  If you begin to experience abdominal pain, fever, nausea, vomiting return to the ER for further evaluation and stop taking Norco.

## 2023-08-15 NOTE — ED Provider Notes (Signed)
[place] EMERGENCY DEPARTMENT AT [facility] Provider Note   CSN: [id number] Arrival date & time: 08/15/23  1400     History  Chief Complaint  Patient presents with   Hip Pain    Omar Haley is a 89 y.o. male.  The history is provided by the patient and a relative.  Hip Pain  Patient presents to the ED today complaining of left hip pain that has been present for the last 3 to 4 months.  No known trauma, no previous joint surgeries.  Previously seen at Ortho for same problem.  Previously received 3 cortisone shots with very minimal relief.  He presents to the ER today because he has not gotten "results" for this pain and is worried about falling.  Describes the pain as sharp and shooting and very focal, only present when standing or walking, not present when sitting.  Denies numbness, tingling, weakness.  He is still active, working outside in the garden.     Home Medications Prior to Admission medications   Medication Sig Start Date End Date Taking? Authorizing Provider  HYDROcodone-acetaminophen (NORCO/VICODIN) 5-325 MG tablet Take 2 tablets by mouth every 4 (four) hours as needed for severe pain (pain score 7-10). 08/15/23  Yes Roemhildt, Lorin T, PA-C  acetaminophen (TYLENOL) 650 MG CR tablet Take 650 mg by mouth every 8 (eight) hours as needed for pain.    [provider]  amLODipine (NORVASC) 10 MG tablet Take 1 tablet (10 mg total) by mouth daily. 02/18/23 05/19/23  Janetta Hora, PA-C  amoxicillin (AMOXIL) 500 MG tablet Take 4 tablets by mouth 1 hour prior to dental procedures and cleanings. 01/26/23   Janetta Hora, PA-C  Aspirin 81 MG EC tablet Take 81 mg by mouth daily.    [provider]  ibuprofen (ADVIL,MOTRIN) 200 MG tablet Take 200 mg by mouth every 4 (four) hours as needed for pain, mild pain or moderate pain.    [provider]  latanoprost (XALATAN) 0.005 % ophthalmic solution Place 1 drop into both eyes at bedtime.     [provider]  levothyroxine (SYNTHROID) 50 MCG tablet Take 50 mcg by mouth daily before breakfast. 04/05/18   [provider]  losartan (COZAAR) 25 MG tablet Take 1 tablet (25 mg total) by mouth at bedtime. 04/13/23   [name], MD      Allergies    Enalapril, Gemfibrozil, Micardis [telmisartan], Spironolactone, Zetia [ezetimibe], and Coreg [carvedilol]    Review of Systems   Review of Systems  Musculoskeletal:  Positive for arthralgias.  All other systems reviewed and are negative.   Physical Exam Updated Vital Signs BP (!) 165/62 (BP Location: Right Arm)   Pulse 72   Temp 98.5 F (36.9 C) (Oral)   Resp 19   Ht 5\' 10"  (1.778 m)   Wt 73 kg   SpO2 97%   BMI 23.09 kg/m  Physical Exam Vitals and nursing note reviewed.  Constitutional:      Appearance: Normal appearance.  HENT:     Head: Normocephalic and atraumatic.  Eyes:     Extraocular Movements: Extraocular movements intact.     Conjunctiva/sclera: Conjunctivae normal.  Cardiovascular:     Rate and Rhythm: Normal rate and regular rhythm.     Pulses: Normal pulses.     Heart sounds: Normal heart sounds. No murmur heard.    No friction rub. No gallop.  Pulmonary:     Effort: Pulmonary effort is normal. No respiratory  distress.     Breath sounds: Normal breath sounds.  Abdominal:     General: Abdomen is flat.     Palpations: Abdomen is soft.     Tenderness: There is no abdominal tenderness.  Musculoskeletal:        General: Tenderness (Focal left hip pain noted on palpation of intraarticular space and PSIS) present. No swelling, deformity or signs of injury. Normal range of motion.     Right lower leg: No edema.     Left lower leg: No edema.  Skin:    General: Skin is warm and dry.  Neurological:     General: No focal deficit present.     Mental Status: He is alert. Mental status is at baseline.  Psychiatric:        Mood and Affect: Mood normal.     ED Results / Procedures /  Treatments   Labs (all labs ordered are listed, but only abnormal results are displayed) Labs Reviewed - No data to display  EKG None  Radiology CT PELVIS WO CONTRAST  Result Date: 08/15/2023 CLINICAL DATA:  Pelvic pain with stress fracture suspected. Negative x-ray. EXAM: CT PELVIS WITHOUT CONTRAST TECHNIQUE: Multidetector CT imaging of the pelvis was performed following the standard protocol without intravenous contrast. RADIATION DOSE REDUCTION: This exam was performed according to the departmental dose-optimization program which includes automated exposure control, adjustment of the mA and/or kV according to patient size and/or use of iterative reconstruction technique. COMPARISON:  CT 12/27/2022 FINDINGS: Urinary Tract: Bladder is not abnormally distended. No bladder wall thickening or stones. Bowel: Visualized portions of small and large bowel are unremarkable. Vascular/Lymphatic: Calcification in the lower aorta and iliac arteries. No aneurysm. Reproductive: Prostate gland is not enlarged. Prostate seed implants are present. No pelvic lymphadenopathy. Other: No free air or free fluid demonstrated. Pelvic wall musculature appears intact. Musculoskeletal: Degenerative changes in the lower lumbar spine and hips. No evidence of acute fracture or dislocation. No focal bone lesion or bone destruction. SI joints and symphysis pubis are not displaced. IMPRESSION: No acute process demonstrated in the pelvis. Degenerative changes in the lower lumbar spine and hips. No acute displaced fractures are identified. Electronically Signed   By: Burman Nieves M.D.   On: 08/15/2023 21:16    Procedures Procedures    Medications Ordered in ED Medications - No data to display  ED Course/ Medical Decision Making/ A&P                                 Medical Decision Making Amount and/or Complexity of Data Reviewed Radiology: ordered.     Patient presents to the ED for concern of left hip pain, this  involves an extensive number of treatment options, and is a complaint that carries with it a high risk of complications and morbidity.  The differential diagnosis includes arthritis, bursitis, sciatica, avascular necrosis, fracture.   Co morbidities that complicate the patient evaluation  HFrEF   Additional history obtained:  Additional history obtained from  Family, Nursing, Outside Medical Records, and Past Admission     Imaging Studies ordered:  I ordered imaging studies including CT pelvis I independently visualized and interpreted imaging which showed degenerative changes of hip and lumbar spine.  With no acute abnormality. I agree with the radiologist interpretation    Medicines ordered and prescription drug management:  I ordered medication including Norco for pain to be picked up at his pharmacy.  I have reviewed the patients home medicines and have made adjustments as needed   Test Considered:  MRI lumbar, nerve studies    Problem List / ED Course:  Left hip pain --patient is an 89 year old male who presents to the ED today for left hip pain.  Denies recent trauma, numbness, tingling, sensory deficits.  He had been previously seen by Ortho amongst other providers for the same problem.  He has previously been given 3 cortisone shots with minimal effects.  He describes the pain as "sharp and stabbing" and very focal only located between the  posterior intra-articular hip space and the PSIS.  Pain is absent at rest and only present when walking or standing on it.  Denies dysuria, hematuria, constipation, diarrhea, nausea, vomiting, fatigue, fever.  He says the only that helps is oxycodone which he was previously prescribed for another issue.  He is worried that the pain would cause him to fall and is in the ER because he has "not gotten results" from his other providers.  CT of his pelvis was done which showed degenerative changes of his hip and lumbar spine.  After  explaining to patient that best follow-up was with Ortho and PCP he was willing to begin pain management after further education was provided.  I explained to him the risks of narcotic use and encouraged OTC Tylenol/ibuprofen pain management until able to meet with Ortho for better management.  Patient was then discharged and told to follow-up with his Ortho.  Patient was given strict return to ER precautions     Dispostion:  After consideration of the diagnostic results and the patients response to treatment, I feel that the patent would benefit from discharge and follow-up with orthopedics and treatment noted as above.    Final Clinical Impression(s) / ED Diagnoses Final diagnoses:  Pain of left hip  Chronic left-sided low back pain without sciatica    Rx / DC Orders ED Discharge Orders          Ordered    HYDROcodone-acetaminophen (NORCO/VICODIN) 5-325 MG tablet  Every 4 hours PRN        08/15/23 2144          Portions of this report may have been transcribed using voice recognition software. Every effort was made to ensure accuracy; however, inadvertent computerized transcription errors may be present.     Lunette Stands, PA-C 08/15/23 2156    Elayne Snare K, DO 08/15/23 2203

## 2023-08-15 NOTE — ED Notes (Signed)
Pt to imagine, NAD noted.

## 2023-08-15 NOTE — ED Triage Notes (Signed)
Pt c/o left hip painx2-35mos. Pt states he has seen doctors for same the last few months and states "no one can do anything about it." Pt states he has had x-rays done for it a few places. Pt denies numbness and tingling. Pt states it's very painful to walk, but is better at rest.

## 2023-08-15 NOTE — ED Notes (Signed)
Discharge instructions reviewed with patient. Patient questions answered and opportunity for education reviewed. Patient voices understanding of discharge instructions with no further questions.

## 2023-08-18 DIAGNOSIS — M25552 Pain in left hip: Secondary | ICD-10-CM | POA: Diagnosis not present

## 2023-08-18 DIAGNOSIS — M47896 Other spondylosis, lumbar region: Secondary | ICD-10-CM | POA: Diagnosis not present

## 2023-08-18 DIAGNOSIS — M47816 Spondylosis without myelopathy or radiculopathy, lumbar region: Secondary | ICD-10-CM | POA: Diagnosis not present

## 2023-08-18 DIAGNOSIS — M5459 Other low back pain: Secondary | ICD-10-CM | POA: Diagnosis not present

## 2023-08-19 DIAGNOSIS — M25552 Pain in left hip: Secondary | ICD-10-CM | POA: Diagnosis not present

## 2023-08-19 DIAGNOSIS — M5459 Other low back pain: Secondary | ICD-10-CM | POA: Diagnosis not present

## 2023-08-23 DIAGNOSIS — M25552 Pain in left hip: Secondary | ICD-10-CM | POA: Diagnosis not present

## 2023-08-23 DIAGNOSIS — M5459 Other low back pain: Secondary | ICD-10-CM | POA: Diagnosis not present

## 2023-08-24 DIAGNOSIS — K869 Disease of pancreas, unspecified: Secondary | ICD-10-CM | POA: Diagnosis not present

## 2023-08-24 DIAGNOSIS — Z952 Presence of prosthetic heart valve: Secondary | ICD-10-CM | POA: Diagnosis not present

## 2023-08-24 DIAGNOSIS — E039 Hypothyroidism, unspecified: Secondary | ICD-10-CM | POA: Diagnosis not present

## 2023-08-24 DIAGNOSIS — I110 Hypertensive heart disease with heart failure: Secondary | ICD-10-CM | POA: Diagnosis not present

## 2023-08-24 DIAGNOSIS — D692 Other nonthrombocytopenic purpura: Secondary | ICD-10-CM | POA: Diagnosis not present

## 2023-08-24 DIAGNOSIS — I5022 Chronic systolic (congestive) heart failure: Secondary | ICD-10-CM | POA: Diagnosis not present

## 2023-08-24 DIAGNOSIS — R600 Localized edema: Secondary | ICD-10-CM | POA: Diagnosis not present

## 2023-08-24 DIAGNOSIS — E785 Hyperlipidemia, unspecified: Secondary | ICD-10-CM | POA: Diagnosis not present

## 2023-08-24 DIAGNOSIS — E559 Vitamin D deficiency, unspecified: Secondary | ICD-10-CM | POA: Diagnosis not present

## 2023-08-24 DIAGNOSIS — C61 Malignant neoplasm of prostate: Secondary | ICD-10-CM | POA: Diagnosis not present

## 2023-08-24 DIAGNOSIS — E1169 Type 2 diabetes mellitus with other specified complication: Secondary | ICD-10-CM | POA: Diagnosis not present

## 2023-08-30 DIAGNOSIS — M5416 Radiculopathy, lumbar region: Secondary | ICD-10-CM | POA: Diagnosis not present

## 2023-09-12 DIAGNOSIS — M5416 Radiculopathy, lumbar region: Secondary | ICD-10-CM | POA: Diagnosis not present

## 2023-09-20 DIAGNOSIS — M5416 Radiculopathy, lumbar region: Secondary | ICD-10-CM | POA: Diagnosis not present

## 2023-09-30 DIAGNOSIS — M545 Low back pain, unspecified: Secondary | ICD-10-CM | POA: Diagnosis not present

## 2023-10-13 DIAGNOSIS — M4316 Spondylolisthesis, lumbar region: Secondary | ICD-10-CM | POA: Diagnosis not present

## 2023-10-13 DIAGNOSIS — M5416 Radiculopathy, lumbar region: Secondary | ICD-10-CM | POA: Diagnosis not present

## 2023-10-20 DIAGNOSIS — E785 Hyperlipidemia, unspecified: Secondary | ICD-10-CM | POA: Diagnosis not present

## 2023-10-20 DIAGNOSIS — I110 Hypertensive heart disease with heart failure: Secondary | ICD-10-CM | POA: Diagnosis not present

## 2023-10-20 DIAGNOSIS — D692 Other nonthrombocytopenic purpura: Secondary | ICD-10-CM | POA: Diagnosis not present

## 2023-10-20 DIAGNOSIS — E1169 Type 2 diabetes mellitus with other specified complication: Secondary | ICD-10-CM | POA: Diagnosis not present

## 2023-10-20 DIAGNOSIS — C61 Malignant neoplasm of prostate: Secondary | ICD-10-CM | POA: Diagnosis not present

## 2023-10-20 DIAGNOSIS — K869 Disease of pancreas, unspecified: Secondary | ICD-10-CM | POA: Diagnosis not present

## 2023-10-20 DIAGNOSIS — Z952 Presence of prosthetic heart valve: Secondary | ICD-10-CM | POA: Diagnosis not present

## 2023-10-20 DIAGNOSIS — I350 Nonrheumatic aortic (valve) stenosis: Secondary | ICD-10-CM | POA: Diagnosis not present

## 2023-10-20 DIAGNOSIS — E039 Hypothyroidism, unspecified: Secondary | ICD-10-CM | POA: Diagnosis not present

## 2023-10-20 DIAGNOSIS — I5022 Chronic systolic (congestive) heart failure: Secondary | ICD-10-CM | POA: Diagnosis not present

## 2023-10-20 DIAGNOSIS — Z01818 Encounter for other preprocedural examination: Secondary | ICD-10-CM | POA: Diagnosis not present

## 2023-10-21 ENCOUNTER — Telehealth: Payer: Self-pay | Admitting: *Deleted

## 2023-10-21 NOTE — Telephone Encounter (Signed)
   Pre-operative Risk Assessment    Patient Name: Omar Haley  DOB: 1931/07/27 MRN: [id number]   Date of last office visit: 04/13/23 DR. THUKKANI Date of next office visit: 01/18/24 [facility]    Request for Surgical Clearance    Procedure:  L4-5 LUMBAR MICRODISKECTOMY  Date of Surgery:  Clearance TBD                                Surgeon:  DR. ALM MOLT Surgeon's Group or Practice Name:  [facility] Phone number:  [phone] Fax number:  [phone] EXT [phone] [address]AN[address]SA   Type of Clearance Requested:   - Medical  - Pharmacy:  Hold Aspirin      Type of Anesthesia:  General    Additional requests/questions:    [name]   10/21/2023, 12:26 PM

## 2023-10-21 NOTE — Telephone Encounter (Signed)
   Name: Omar Haley  DOB: [birth year]  MRN: [id number]  Primary Cardiologist: Arun K Thukkani, MD   Preoperative team, please contact this patient and set up a phone call appointment for further preoperative risk assessment. Please obtain consent and complete medication review. Thank you for your help.  I confirm that guidance regarding antiplatelet and oral anticoagulation therapy has been completed and, if necessary, noted below.  Patient's aspirin  is not prescribed by cardiology.  Recommendations for holding aspirin  will need to come from prescribing provider.  I also confirmed the patient resides in the state of [place] . As per [facility] telemedicine laws, the patient must reside in the state in which the provider is licensed.   Josefa CHRISTELLA Beauvais, NP 10/21/2023, 3:04 PM [facility]

## 2023-10-25 ENCOUNTER — Telehealth: Payer: Self-pay

## 2023-10-25 NOTE — Telephone Encounter (Signed)
Called and spoke to patient's spouse she who scheduled telephone visit consent and mec rec done     Patient Consent for Virtual Visit         Omar Haley has provided verbal consent on 10/25/2023 for a virtual visit (video or telephone).   CONSENT FOR VIRTUAL VISIT FOR:  Omar Haley  By participating in this virtual visit I agree to the following:  I hereby voluntarily request, consent and authorize [facility] and its employed or contracted physicians, physician assistants, nurse practitioners or other licensed health care professionals (the Practitioner), to provide me with telemedicine health care services (the "Services") as deemed necessary by the treating Practitioner. I acknowledge and consent to receive the Services by the Practitioner via telemedicine. I understand that the telemedicine visit will involve communicating with the Practitioner through live audiovisual communication technology and the disclosure of certain medical information by electronic transmission. I acknowledge that I have been given the opportunity to request an in-person assessment or other available alternative prior to the telemedicine visit and am voluntarily participating in the telemedicine visit.  I understand that I have the right to withhold or withdraw my consent to the use of telemedicine in the course of my care at any time, without affecting my right to future care or treatment, and that the Practitioner or I may terminate the telemedicine visit at any time. I understand that I have the right to inspect all information obtained and/or recorded in the course of the telemedicine visit and may receive copies of available information for a reasonable fee.  I understand that some of the potential risks of receiving the Services via telemedicine include:  Delay or interruption in medical evaluation due to technological equipment failure or disruption; Information transmitted may not be sufficient  (e.g. poor resolution of images) to allow for appropriate medical decision making by the Practitioner; and/or  In rare instances, security protocols could fail, causing a breach of personal health information.  Furthermore, I acknowledge that it is my responsibility to provide information about my medical history, conditions and care that is complete and accurate to the best of my ability. I acknowledge that Practitioner's advice, recommendations, and/or decision may be based on factors not within their control, such as incomplete or inaccurate data provided by me or distortions of diagnostic images or specimens that may result from electronic transmissions. I understand that the practice of medicine is not an exact science and that Practitioner makes no warranties or guarantees regarding treatment outcomes. I acknowledge that a copy of this consent can be made available to me via my patient portal [facility] MyChart), or I can request a printed copy by calling the office of [facility].    I understand that my insurance will be billed for this visit.   I have read or had this consent read to me. I understand the contents of this consent, which adequately explains the benefits and risks of the Services being provided via telemedicine.  I have been provided ample opportunity to ask questions regarding this consent and the Services and have had my questions answered to my satisfaction. I give my informed consent for the services to be provided through the use of telemedicine in my medical care

## 2023-10-25 NOTE — Telephone Encounter (Signed)
Called and spoke to patient's spouse she voiced understanding and telephone visit has been scheduled

## 2023-11-07 NOTE — Progress Notes (Unsigned)
 Virtual Visit via Telephone Note   Because of [name] co-morbid illnesses, he is at least at moderate risk for complications without adequate follow up.  This format is felt to be most appropriate for this patient at this time.  Due to technical limitations with video connection (technology), today's appointment will be conducted as an audio only telehealth visit, and [name] verbally agreed to proceed in this manner.   All issues noted in this document were discussed and addressed.  No physical exam could be performed with this format.  Evaluation Performed:  Preoperative cardiovascular risk assessment _____________   Date:  11/07/2023   Patient ID:  Omar Haley, DOB Oct 09, 1930, MRN [id number] Patient Location:  Home Provider location:   Office  Primary Care Provider:  Alysia Penna, MD Primary Cardiologist:  [name], MD  Chief Complaint / Patient Profile   89 y.o. y/o male with a h/o aortic stenosis s/p TAVR 01/2023, hypothyroidism, HFrEF, DM type II, HTN who is pending L4-5 lumbar microdiscectomy and presents today for telephonic preoperative cardiovascular risk assessment.  History of Present Illness    Omar Haley is a 89 y.o. male who presents via audio/video conferencing for a telehealth visit today.  Pt was last seen in cardiology clinic on 04/13/2023 by Dr. Lynnette Caffey.  At that time Kennieth Plotts was doing well but endorsed some lower extremity weakness that occurred prior to his TAVR procedure.  He is scheduled to undergo updated 2D echo on 01/2024 the patient is now pending procedure as outlined above. Since his last visit, he has done well with no new cardiac complaints.  He does note some ongoing lower extremity swelling that improves with elevation of his extremities.  He is limited only by back pain and denies any shortness of breath or decreased exertion with ambulation.  He reports that his blood pressures have been stable and most recent check by his  PCP was 130/80.  He denies chest pain, shortness of breath, lower extremity edema, fatigue, palpitations, melena, hematuria, hemoptysis, diaphoresis, weakness, presyncope, syncope, orthopnea, and PND.    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 Lynnette Caffey & Dr. Laneta Simmers   Unifocal PVCs    Vitamin D deficiency    Past Surgical History:  Procedure Laterality Date   COLON SURGERY  1991   rt hemicolectomy   HERNIA REPAIR     INTRAOPERATIVE TRANSTHORACIC ECHOCARDIOGRAM N/A 01/18/2023   Procedure: INTRAOPERATIVE TRANSTHORACIC ECHOCARDIOGRAM;  Surgeon: [name], MD;  Location: [facility] INVASIVE CV LAB;  Service: Open Heart Surgery;  Laterality: N/A;   MELANOMA EXCISION  11/2005&04/2011   skin   RIGHT/LEFT HEART CATH AND CORONARY ANGIOGRAPHY N/A 01/06/2023   Procedure: RIGHT/LEFT HEART CATH AND CORONARY ANGIOGRAPHY;  Surgeon: [name], MD;  Location:  [facility] INVASIVE CV LAB;  Service: Cardiovascular;  Laterality: N/A;   TRANSCATHETER AORTIC VALVE REPLACEMENT, TRANSFEMORAL N/A 01/18/2023   Procedure: Transcatheter Aortic Valve Replacement, Transfemoral;  Surgeon: [name], MD;  Location: [facility] INVASIVE CV LAB;  Service: Open Heart Surgery;  Laterality: N/A;    Allergies  Allergies  Allergen Reactions   Enalapril     cough   Gemfibrozil Other (See Comments)    Unknown   Micardis [Telmisartan]      Diarrhea    Spironolactone Itching    Scalp itching   Zetia [Ezetimibe]     laryngitis   Coreg [Carvedilol] Rash    Home Medications    Prior to Admission medications   Medication Sig Start Date End Date Taking? Authorizing Provider  acetaminophen (TYLENOL) 650 MG CR tablet Take 650 mg by mouth every 8 (eight) hours as needed for pain.    [provider]  amLODipine (NORVASC) 10 MG tablet Take 1 tablet (10 mg total) by mouth daily. 02/18/23 05/19/23  Janetta Hora, PA-C  amoxicillin (AMOXIL) 500 MG tablet Take 4 tablets by mouth 1 hour prior to dental procedures and cleanings. 01/26/23   Janetta Hora, PA-C  Aspirin 81 MG EC tablet Take 81 mg by mouth daily.    [provider]  HYDROcodone-acetaminophen (NORCO/VICODIN) 5-325 MG tablet Take 2 tablets by mouth every 4 (four) hours as needed for severe pain (pain score 7-10). 08/15/23   Roemhildt, Lorin T, PA-C  ibuprofen (ADVIL,MOTRIN) 200 MG tablet Take 200 mg by mouth every 4 (four) hours as needed for pain, mild pain or moderate pain.    [provider]  latanoprost (XALATAN) 0.005 % ophthalmic solution Place 1 drop into both eyes at bedtime.    [provider]  levothyroxine (SYNTHROID) 50 MCG tablet Take 50 mcg by mouth daily before breakfast. 04/05/18   [provider]  losartan (COZAAR) 25 MG tablet Take 1 tablet (25 mg total) by mouth at bedtime. 04/13/23   [name], MD    Physical Exam    Vital Signs:  Chaynce Schafer does not have vital signs available for review today.130/80  Given telephonic nature of communication, physical exam is limited. AAOx3. NAD. Normal affect.  Speech and respirations are unlabored.  Accessory Clinical Findings    None  Assessment & Plan    1.  Preoperative Cardiovascular Risk Assessment: -Patient's RCRI score is 6.6%  The patient affirms he has been doing well without any new cardiac symptoms. They are able to achieve 4 METS without  cardiac limitations. Therefore, based on ACC/AHA guidelines, the patient would be at acceptable risk for the planned procedure without further cardiovascular testing. The patient was advised that if he develops new symptoms prior to surgery to contact our office to arrange for a follow-up visit, and he verbalized understanding.   The patient was advised that if he develops new symptoms prior to surgery to contact our office to arrange for a follow-up visit, and he verbalized understanding.  Patient's aspirin is not managed by cardiology  A copy of this note will be routed to requesting surgeon.  Time:   Today, I have spent 8 minutes with the patient with telehealth technology discussing medical history, symptoms, and management plan.     Napoleon Form, Leodis Rains, NP  11/07/2023, 7:29 AM

## 2023-11-08 ENCOUNTER — Ambulatory Visit: Payer: Medicare Other | Attending: Cardiology

## 2023-11-08 DIAGNOSIS — Z01810 Encounter for preprocedural cardiovascular examination: Secondary | ICD-10-CM

## 2023-11-10 ENCOUNTER — Other Ambulatory Visit: Payer: Self-pay | Admitting: Neurological Surgery

## 2023-11-14 NOTE — Pre-Procedure Instructions (Signed)
 Surgical Instructions   Your procedure is scheduled on Monday, November 21, 2023. Report to [facility] Main Entrance "A" at 12:15pm, then check in with the Admitting office. Any questions or running late day of surgery: call [phone]  Questions prior to your surgery date: call [phone], Monday-Friday, 8am-4pm. If you experience any cold or flu symptoms such as cough, fever, chills, shortness of breath, etc. between now and your scheduled surgery, please notify us at the above number.     Remember:  Do not eat after midnight the night before your surgery  You may drink clear liquids until 11:15am the morning of your surgery.   Clear liquids allowed are: Water, Non-Citrus Juices (without pulp), Carbonated Beverages, Clear Tea (no milk, honey, etc.), Black Coffee Only (NO MILK, CREAM OR POWDERED CREAMER of any kind), and Gatorade.    Take these medicines the morning of surgery with A SIP OF WATER : Amlodipine (Norvasc) Levothyroxine (Synthroid)  May take these medicines IF NEEDED: Acetaminophen (Tylenol)  One week prior to surgery, STOP taking any Aspirin (unless otherwise instructed by your surgeon) Aleve, Naproxen, Ibuprofen, Motrin, Advil, Goody's, BC's, all herbal medications, fish oil, and non-prescription vitamins.    HOW TO MANAGE YOUR DIABETES BEFORE AND AFTER SURGERY  Why is it important to control my blood sugar before and after surgery? Improving blood sugar levels before and after surgery helps healing and can limit problems. A way of improving blood sugar control is eating a healthy diet by:  Eating less sugar and carbohydrates  Increasing activity/exercise  Talking with your doctor about reaching your blood sugar goals High blood sugars (greater than 180 mg/dL) can raise your risk of infections and slow your recovery, so you will need to focus on controlling your diabetes during the weeks before surgery. Make sure that the doctor who takes care of your diabetes  knows about your planned surgery including the date and location.  How do I manage my blood sugar before surgery? Check your blood sugar at least 4 times a day, starting 2 days before surgery, to make sure that the level is not too high or low.  Check your blood sugar the morning of your surgery when you wake up and every 2 hours until you get to the Short Stay unit.  If your blood sugar is less than 70 mg/dL, you will need to treat for low blood sugar: Do not take insulin. Treat a low blood sugar (less than 70 mg/dL) with  cup of clear juice (cranberry or apple), 4 glucose tablets, OR glucose gel. Recheck blood sugar in 15 minutes after treatment (to make sure it is greater than 70 mg/dL). If your blood sugar is not greater than 70 mg/dL on recheck, call [phone] for further instructions. Report your blood sugar to the short stay nurse when you get to Short Stay.  If you are admitted to the hospital after surgery: Your blood sugar will be checked by the staff and you will probably be given insulin after surgery (instead of oral diabetes medicines) to make sure you have good blood sugar levels. The goal for blood sugar control after surgery is 80-180 mg/dL.                      Do NOT Smoke (Tobacco/Vaping) for 24 hours prior to your procedure.  If you use a CPAP at night, you may bring your mask/headgear for your overnight stay.   You will be asked to remove any contacts,  glasses, piercing's, hearing aid's, dentures/partials prior to surgery. Please bring cases for these items if needed.    Patients discharged the day of surgery will not be allowed to drive home, and someone needs to stay with them for 24 hours.  SURGICAL WAITING ROOM VISITATION Patients may have no more than 2 support people in the waiting area - these visitors may rotate.   Pre-op nurse will coordinate an appropriate time for 1 ADULT support person, who may not rotate, to accompany patient in pre-op.  Children  under the age of 96 must have an adult with them who is not the patient and must remain in the main waiting area with an adult.  If the patient needs to stay at the hospital during part of their recovery, the visitor guidelines for inpatient rooms apply.  Please refer to the [facility] website for the visitor guidelines for any additional information.   If you received a COVID test during your pre-op visit  it is requested that you wear a mask when out in public, stay away from anyone that may not be feeling well and notify your surgeon if you develop symptoms. If you have been in contact with anyone that has tested positive in the last 10 days please notify you surgeon.      Pre-operative 5 CHG Bathing Instructions   You can play a key role in reducing the risk of infection after surgery. Your skin needs to be as free of germs as possible. You can reduce the number of germs on your skin by washing with CHG (chlorhexidine gluconate) soap before surgery. CHG is an antiseptic soap that kills germs and continues to kill germs even after washing.   DO NOT use if you have an allergy to chlorhexidine/CHG or antibacterial soaps. If your skin becomes reddened or irritated, stop using the CHG and notify one of our RNs at [phone].   Please shower with the CHG soap starting 4 days before surgery using the following schedule:     Please keep in mind the following:  DO NOT shave, including legs and underarms, starting the day of your first shower.   You may shave your face at any point before/day of surgery.  Place clean sheets on your bed the day you start using CHG soap. Use a clean washcloth (not used since being washed) for each shower. DO NOT sleep with pets once you start using the CHG.   CHG Shower Instructions:  Wash your face and private area with normal soap. If you choose to wash your hair, wash first with your normal shampoo.  After you use shampoo/soap, rinse your hair and body  thoroughly to remove shampoo/soap residue.  Turn the water OFF and apply about 3 tablespoons (45 ml) of CHG soap to a CLEAN washcloth.  Apply CHG soap ONLY FROM YOUR NECK DOWN TO YOUR TOES (washing for 3-5 minutes)  DO NOT use CHG soap on face, private areas, open wounds, or sores.  Pay special attention to the area where your surgery is being performed.  If you are having back surgery, having someone wash your back for you may be helpful. Wait 2 minutes after CHG soap is applied, then you may rinse off the CHG soap.  Pat dry with a clean towel  Put on clean clothes/pajamas   If you choose to wear lotion, please use ONLY the CHG-compatible lotions that are listed below.  Additional instructions for the day of surgery: DO NOT APPLY any lotions,  deodorants, cologne, or perfumes.   Do not bring valuables to the hospital. [facility] is not responsible for any belongings/valuables. Do not wear nail polish, gel polish, artificial nails, or any other type of covering on natural nails (fingers and toes) Do not wear jewelry or makeup Put on clean/comfortable clothes.  Please brush your teeth.  Ask your nurse before applying any prescription medications to the skin.     CHG Compatible Lotions   Aveeno Moisturizing lotion  Cetaphil Moisturizing Cream  Cetaphil Moisturizing Lotion  Clairol Herbal Essence Moisturizing Lotion, Dry Skin  Clairol Herbal Essence Moisturizing Lotion, Extra Dry Skin  Clairol Herbal Essence Moisturizing Lotion, Normal Skin  Curel Age Defying Therapeutic Moisturizing Lotion with Alpha Hydroxy  Curel Extreme Care Body Lotion  Curel Soothing Hands Moisturizing Hand Lotion  Curel Therapeutic Moisturizing Cream, Fragrance-Free  Curel Therapeutic Moisturizing Lotion, Fragrance-Free  Curel Therapeutic Moisturizing Lotion, Original Formula  Eucerin Daily Replenishing Lotion  Eucerin Dry Skin Therapy Plus Alpha Hydroxy Crme  Eucerin Dry Skin Therapy Plus Alpha Hydroxy  Lotion  Eucerin Original Crme  Eucerin Original Lotion  Eucerin Plus Crme Eucerin Plus Lotion  Eucerin TriLipid Replenishing Lotion  Keri Anti-Bacterial Hand Lotion  Keri Deep Conditioning Original Lotion Dry Skin Formula Softly Scented  Keri Deep Conditioning Original Lotion, Fragrance Free Sensitive Skin Formula  Keri Lotion Fast Absorbing Fragrance Free Sensitive Skin Formula  Keri Lotion Fast Absorbing Softly Scented Dry Skin Formula  Keri Original Lotion  Keri Skin Renewal Lotion Keri Silky Smooth Lotion  Keri Silky Smooth Sensitive Skin Lotion  Nivea Body Creamy Conditioning Oil  Nivea Body Extra Enriched Lotion  Nivea Body Original Lotion  Nivea Body Sheer Moisturizing Lotion Nivea Crme  Nivea Skin Firming Lotion  NutraDerm 30 Skin Lotion  NutraDerm Skin Lotion  NutraDerm Therapeutic Skin Cream  NutraDerm Therapeutic Skin Lotion  ProShield Protective Hand Cream  Provon moisturizing lotion  Please read over the following fact sheets that you were given.

## 2023-11-15 ENCOUNTER — Other Ambulatory Visit: Payer: Self-pay

## 2023-11-15 ENCOUNTER — Encounter (HOSPITAL_COMMUNITY): Payer: Self-pay

## 2023-11-15 ENCOUNTER — Encounter (HOSPITAL_COMMUNITY)
Admission: RE | Admit: 2023-11-15 | Discharge: 2023-11-15 | Disposition: A | Discharge status: Home / Self Care | Payer: Medicare Other | Source: Ambulatory Visit | Attending: Neurological Surgery | Admitting: Neurological Surgery

## 2023-11-15 VITALS — BP 184/73 | HR 84 | Temp 98.0°F | Resp 17 | Ht 69.0 in | Wt 162.7 lb

## 2023-11-15 DIAGNOSIS — E785 Hyperlipidemia, unspecified: Secondary | ICD-10-CM | POA: Insufficient documentation

## 2023-11-15 DIAGNOSIS — E119 Type 2 diabetes mellitus without complications: Secondary | ICD-10-CM | POA: Diagnosis not present

## 2023-11-15 DIAGNOSIS — Z952 Presence of prosthetic heart valve: Secondary | ICD-10-CM | POA: Diagnosis not present

## 2023-11-15 DIAGNOSIS — M5416 Radiculopathy, lumbar region: Secondary | ICD-10-CM | POA: Insufficient documentation

## 2023-11-15 DIAGNOSIS — I110 Hypertensive heart disease with heart failure: Secondary | ICD-10-CM | POA: Diagnosis not present

## 2023-11-15 DIAGNOSIS — I447 Left bundle-branch block, unspecified: Secondary | ICD-10-CM | POA: Insufficient documentation

## 2023-11-15 DIAGNOSIS — Z01818 Encounter for other preprocedural examination: Secondary | ICD-10-CM

## 2023-11-15 DIAGNOSIS — Z01812 Encounter for preprocedural laboratory examination: Secondary | ICD-10-CM | POA: Insufficient documentation

## 2023-11-15 DIAGNOSIS — I2510 Atherosclerotic heart disease of native coronary artery without angina pectoris: Secondary | ICD-10-CM | POA: Diagnosis not present

## 2023-11-15 DIAGNOSIS — I5022 Chronic systolic (congestive) heart failure: Secondary | ICD-10-CM | POA: Diagnosis not present

## 2023-11-15 LAB — CBC
HCT: 40.1 % (ref 39.0–52.0)
Hemoglobin: 13.2 g/dL (ref 13.0–17.0)
MCH: 28.4 pg (ref 26.0–34.0)
MCHC: 32.9 g/dL (ref 30.0–36.0)
MCV: 86.2 fL (ref 80.0–100.0)
Platelets: 156 10*3/uL (ref 150–400)
RBC: 4.65 MIL/uL (ref 4.22–5.81)
RDW: 13.8 % (ref 11.5–15.5)
WBC: 4.6 10*3/uL (ref 4.0–10.5)
nRBC: 0 % (ref 0.0–0.2)

## 2023-11-15 LAB — BASIC METABOLIC PANEL
Anion gap: 14 (ref 5–15)
BUN: 11 mg/dL (ref 8–23)
CO2: 24 mmol/L (ref 22–32)
Calcium: 9.9 mg/dL (ref 8.9–10.3)
Chloride: 101 mmol/L (ref 98–111)
Creatinine, Ser: 0.79 mg/dL (ref 0.61–1.24)
GFR, Estimated: >60 mL/min (ref >60)
Glucose, Bld: 215 mg/dL — ABNORMAL HIGH (ref 70–99)
Potassium: 4.3 mmol/L (ref 3.5–5.1)
Sodium: 139 mmol/L (ref 135–145)

## 2023-11-15 LAB — SURGICAL PCR SCREEN
MRSA, PCR: NEGATIVE
Staphylococcus aureus: POSITIVE — AB

## 2023-11-15 LAB — HEMOGLOBIN A1C
Hgb A1c MFr Bld: 7.9 % — ABNORMAL HIGH (ref 4.8–5.6)
Mean Plasma Glucose: 180.03 mg/dL

## 2023-11-15 LAB — PROTIME-INR
INR: 1 (ref 0.8–1.2)
Prothrombin Time: 13.7 s (ref 11.4–15.2)

## 2023-11-15 LAB — GLUCOSE, CAPILLARY: Glucose-Capillary: 205 mg/dL — ABNORMAL HIGH (ref 70–99)

## 2023-11-15 NOTE — Progress Notes (Addendum)
 PCP - Dr. Alysia Penna Cardiologist -  Dr. Lynnette Caffey  PPM/ICD - denies Device Orders - na Rep Notified - na  Chest x-ray - 01/14/2023 EKG - 01/26/2023 Stress Test -  ECHO - 02/18/2023 Cardiac Cath - 01/06/2023  Sleep Study - denies CPAP - denies  Type II diabetic, diet controlled, takes no medications, does not check sugars.  Blood sugar 205 in PAT Fasting Blood Sugar - na Checks Blood Sugar : na   Last dose of GLP1 agonist-  na GLP1 instructions: na  Blood Thinner Instructions: denies Aspirin Instructions:denies  ERAS Protcol -  Clears until 1115  Anesthesia review: Yes. CAD, HTN, CHF, DM, aortic stenosis  Patient denies shortness of breath, fever, cough and chest pain at PAT appointment   All instructions explained to the patient, with a verbal understanding of the material. Patient agrees to go over the instructions while at home for a better understanding. Patient also instructed to self quarantine after being tested for COVID-19. The opportunity to ask questions was provided.

## 2023-11-16 NOTE — Progress Notes (Signed)
 Anesthesia Chart Review:  Case: [id number] Date/Time: 11/21/23 1403   Procedure: Microdiscectomy - L4-L5 - left (Left: Back) - 3C   Anesthesia type: General   Pre-op diagnosis: Radiculopathy lumbar   Location: [facility] OR ROOM 18 / [facility] OR   Surgeons: Arman Bogus, MD       DISCUSSION: Patient is a 89 year old male scheduled for the above procedure.   History includes never smoker, HTN, HLD, CAD (mild-moderate 12/2022), severe AS (s/p TAVR 01/18/23), left BBB, CHF (EF 35-40% 12/2022 pre-TAVR; EF 50-55% 02/2023), diet controlled DM2, neuropathy, glaucoma, melanoma (s/p excision 2007, 2012).   Preoperative cardiology input outlined on 11/08/23 by Robin Searing, NP, "Preoperative Cardiovascular Risk Assessment: -Patient's RCRI score is 6.6%   The patient affirms he has been doing well without any new cardiac symptoms. They are able to achieve 4 METS without cardiac limitations. Therefore, based on ACC/AHA guidelines, the patient would be at acceptable risk for the planned procedure without further cardiovascular testing. The patient was advised that if he develops new symptoms prior to surgery to contact our office to arrange for a follow-up visit, and he verbalized understanding.... Patient's aspirin is not managed by cardiology"  He reported ASA is on hold for surgery.  He had DM2 which has been managed by diet/exercise. Per PCP records, A1c 7.1% in March 2024. 11/15/23 A1c up to 7.9%. I routed PAT labs results to PCP for future follow-up purposes. Omar Haley does not check home CBGs. He will get a CBG on arrival.   Anesthesia team to evaluate on the day of surgery.   VS: BP (!) 184/73   Pulse 84   Temp 36.7 C   Resp 17   Ht 5\' 9"  (1.753 m)   Wt 73.8 kg   SpO2 99%   BMI 24.03 kg/m  BP Readings from Last 3 Encounters:  11/15/23 (!) 184/73  08/15/23 (!) 164/70  04/13/23 135/80     PROVIDERS: Alysia Penna, MD is PCP  Alverda Skeans, MD is cardiologist   LABS: Preoperative labs noted.  See DISCUSSION.  (all labs ordered are listed, but only abnormal results are displayed)  Labs Reviewed  SURGICAL PCR SCREEN - Abnormal; Notable for the following components:      Result Value   Staphylococcus aureus POSITIVE (*)    All other components within normal limits  GLUCOSE, CAPILLARY - Abnormal; Notable for the following components:   Glucose-Capillary 205 (*)    All other components within normal limits  BASIC METABOLIC PANEL - Abnormal; Notable for the following components:   Glucose, Bld 215 (*)    All other components within normal limits  HEMOGLOBIN A1C - Abnormal; Notable for the following components:   Hgb A1c MFr Bld 7.9 (*)    All other components within normal limits  CBC  PROTIME-INR     IMAGES: CT Pelvis 08/15/23: PRESSION: No acute process demonstrated in the pelvis. Degenerative changes in the lower lumbar spine and hips. No acute displaced fractures are identified.    EKG: EKG 01/26/23:  Sinus rhythm with marked sinus arrhythmia First degree AV block Left axis deviation Left bundle branch block   CV: Echo 02/18/23: IMPRESSIONS   1. Left ventricular ejection fraction, by estimation, is 50 to 55%. Left  ventricular ejection fraction by 2D MOD biplane is 49.6 %. The left  ventricle has low normal function. The left ventricle demonstrates  regional wall motion abnormalities (see  scoring diagram/findings for description). There is mild left ventricular  hypertrophy.  Left ventricular diastolic parameters are consistent with  Grade I diastolic dysfunction (impaired relaxation). There is incoordinate  septal motion. There is moderate  hypokinesis of the left ventricular, basal septal wall.   2. Right ventricular systolic function is low normal. The right  ventricular size is normal. There is normal pulmonary artery systolic  pressure. The estimated right ventricular systolic pressure is 32.6 mmHg.   3. Left atrial size was mildly dilated.   4. The  mitral valve is abnormal. Mild to moderate mitral valve  regurgitation.   5. The perivalvular leak is noted adjacent to the RVOT. The aortic valve  has been repaired/replaced. Aortic valve regurgitation is trivial. There  is a 26 mm Sapien prosthetic (TAVR) valve present in the aortic position.  Procedure Date: 01/18/23. Echo  findings are consistent with perivalvular leak of the aortic prosthesis.  Aortic valve area, by VTI measures 2.59 cm. Aortic valve mean gradient  measures 13.7 mmHg. Aortic valve Vmax measures 2.35 m/s.   6. The inferior vena cava is normal in size with greater than 50%  respiratory variability, suggesting right atrial pressure of 3 mmHg.   Comparison(s): Changes from prior study are noted. 01/19/2023: LVEF 55-60%,  Sapien 3 THV, AOV gradient 10 mmHg mean.  - Comparison LVEF 55-60% 01/19/23; 35-40% 12/14/22 pre-TAVR   RHC/LHC (pre-TAVR) 01/06/23: 1.  Mild to moderate diffuse obstructive coronary artery disease with no high-grade obstructions. 2.  Mean RA pressure of 3, RV pressure of 32/0 with RV end-diastolic pressure of 5 mmHg, mean wedge pressure of 6 mmHg, and mean PA pressure of 12 mmHg with a Fick cardiac output of 8.2 L/min and Fick cardiac index of 4.3 L/min/m.   Recommendation: Continue evaluation for aortic valve intervention.    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 Lynnette Caffey & Dr. Laneta Simmers    Unifocal PVCs    Vitamin D deficiency     Past Surgical History:  Procedure Laterality Date   APPENDECTOMY     COLON SURGERY  09/13/1989   rt hemicolectomy   HERNIA REPAIR     INTRAOPERATIVE TRANSTHORACIC ECHOCARDIOGRAM N/A 01/18/2023   Procedure: INTRAOPERATIVE TRANSTHORACIC ECHOCARDIOGRAM;  Surgeon: [name], MD;  Location: [facility] INVASIVE CV LAB;  Service: Open Heart Surgery;  Laterality: N/A;   MELANOMA EXCISION  11/2005&04/2011   skin   RIGHT/LEFT HEART CATH AND CORONARY ANGIOGRAPHY N/A 01/06/2023   Procedure: RIGHT/LEFT HEART CATH AND CORONARY ANGIOGRAPHY;  Surgeon: [name], MD;  Location: [facility] INVASIVE CV LAB;  Service: Cardiovascular;  Laterality: N/A;   TRANSCATHETER AORTIC VALVE REPLACEMENT, TRANSFEMORAL N/A 01/18/2023   Procedure: Transcatheter Aortic Valve Replacement, Transfemoral;  Surgeon: [name], MD;  Location: [facility] INVASIVE CV LAB;  Service: Open Heart Surgery;  Laterality: N/A;    MEDICATIONS:  acetaminophen (TYLENOL) 650 MG  CR tablet   amLODipine (NORVASC) 10 MG tablet   amoxicillin (AMOXIL) 500 MG tablet   Aspirin 81 MG EC tablet   HYDROcodone-acetaminophen (NORCO/VICODIN) 5-325 MG tablet   latanoprost (XALATAN) 0.005 % ophthalmic solution   levothyroxine (SYNTHROID) 50 MCG tablet   losartan (COZAAR) 25 MG tablet   No current facility-administered medications for this encounter.   Amoxicillin is for dental prophylaxis.   Shonna Chock, PA-C Surgical Short Stay/Anesthesiology [facility] Phone [phone] [facility] Phone [phone] 11/16/2023 12:53 PM

## 2023-11-16 NOTE — Anesthesia Preprocedure Evaluation (Addendum)
 Anesthesia Evaluation  Patient identified by MRN, date of birth, ID band Patient awake    Reviewed: Allergy & Precautions, NPO status , Patient's Chart, lab work & pertinent test results  History of Anesthesia Complications Negative for: history of anesthetic complications  Airway Mallampati: II  TM Distance: >3 FB Neck ROM: Full    Dental  (+) Chipped, Dental Advisory Given   Pulmonary neg pulmonary ROS   breath sounds clear to auscultation       Cardiovascular hypertension, Pt. on medications (-) angina + CAD (moderate)  + Valvular Problems/Murmurs (s/p TAVR 2024)  Rhythm:Regular Rate:Normal  01/2023 ECHO:  1. 26 mm S3 in aortic position. Vmax 2.1 m/s, MG 10 mmHG, EOA 2.24 cm2.  No regurgitation or paravalvular leakl. Normal prosthesis. The aortic valve has been repaired/replaced. Aortic valve regurgitation is not visualized. There is a 26 mm Edwards Sapien prosthetic (TAVR) valve present in the aortic position. Procedure Date: 01/18/2023. Echo findings are consistent with normal structure and function  of the aortic valve prosthesis.   2. LV EF 55 to 60%. The left ventricle has normal function, no regional wall motion abnormalities. There is mild concentric LVF.  Grade I diastolic dysfunction (impaired  relaxation).   3. RVF is normal. The right ventricular size is normal. There is mildly elevated pulmonary artery systolic pressure. The estimated right ventricular systolic pressure is 36.9 mmHg.   4. Left atrial size was mildly dilated.   5. The mitral valve is grossly normal. Mild mitral valve regurgitation. No evidence of mitral stenosis.     Neuro/Psych glaucoma    GI/Hepatic negative GI ROS, Neg liver ROS,,,  Endo/Other  diabetes (diet controlled, glu 191)Hypothyroidism    Renal/GU negative Renal ROS     Musculoskeletal   Abdominal   Peds  Hematology Hb 13.2, plt 156k   Anesthesia Other Findings    Reproductive/Obstetrics                             Anesthesia Physical Anesthesia Plan  ASA: 3  Anesthesia Plan: General   Post-op Pain Management: Tylenol PO (pre-op)*   Induction: Intravenous  PONV Risk Score and Plan: 2 and Ondansetron, Treatment may vary due to age or medical condition and Dexamethasone  Airway Management Planned: Oral ETT  Additional Equipment: None  Intra-op Plan:   Post-operative Plan: Extubation in OR  Informed Consent: I have reviewed the patients History and Physical, chart, labs and discussed the procedure including the risks, benefits and alternatives for the proposed anesthesia with the patient or authorized representative who has indicated his/her understanding and acceptance.     Dental advisory given  Plan Discussed with: CRNA and Surgeon  Anesthesia Plan Comments: (PAT note written 11/16/2023 by Shonna Chock, PA-C.  )       Anesthesia Quick Evaluation

## 2023-11-18 NOTE — Progress Notes (Signed)
 Patient informef of new arrival time for surgery on 11/21/2023. New arrival time of 0830.

## 2023-11-21 ENCOUNTER — Ambulatory Visit (HOSPITAL_COMMUNITY): Payer: Self-pay | Admitting: Vascular Surgery

## 2023-11-21 ENCOUNTER — Other Ambulatory Visit: Payer: Self-pay

## 2023-11-21 ENCOUNTER — Encounter (HOSPITAL_COMMUNITY): Payer: Self-pay | Admitting: Neurological Surgery

## 2023-11-21 ENCOUNTER — Ambulatory Visit (HOSPITAL_COMMUNITY)

## 2023-11-21 ENCOUNTER — Ambulatory Visit (HOSPITAL_BASED_OUTPATIENT_CLINIC_OR_DEPARTMENT_OTHER): Admitting: Anesthesiology

## 2023-11-21 ENCOUNTER — Encounter (HOSPITAL_COMMUNITY)
Admission: RE | Disposition: A | Discharge status: Home / Self Care | Payer: Self-pay | Source: Home / Self Care | Attending: Neurological Surgery

## 2023-11-21 ENCOUNTER — Observation Stay (HOSPITAL_COMMUNITY)
Admission: RE | Admit: 2023-11-21 | Discharge: 2023-11-21 | Disposition: A | Discharge status: Home / Self Care | Payer: Medicare Other | Attending: Neurological Surgery | Admitting: Neurological Surgery

## 2023-11-21 DIAGNOSIS — Z0189 Encounter for other specified special examinations: Secondary | ICD-10-CM | POA: Diagnosis not present

## 2023-11-21 DIAGNOSIS — I5020 Unspecified systolic (congestive) heart failure: Secondary | ICD-10-CM | POA: Diagnosis not present

## 2023-11-21 DIAGNOSIS — Z85828 Personal history of other malignant neoplasm of skin: Secondary | ICD-10-CM | POA: Diagnosis not present

## 2023-11-21 DIAGNOSIS — I10 Essential (primary) hypertension: Secondary | ICD-10-CM | POA: Diagnosis not present

## 2023-11-21 DIAGNOSIS — Z79899 Other long term (current) drug therapy: Secondary | ICD-10-CM | POA: Insufficient documentation

## 2023-11-21 DIAGNOSIS — I2510 Atherosclerotic heart disease of native coronary artery without angina pectoris: Secondary | ICD-10-CM | POA: Diagnosis not present

## 2023-11-21 DIAGNOSIS — M48061 Spinal stenosis, lumbar region without neurogenic claudication: Secondary | ICD-10-CM | POA: Diagnosis not present

## 2023-11-21 DIAGNOSIS — Z9889 Other specified postprocedural states: Principal | ICD-10-CM

## 2023-11-21 DIAGNOSIS — M5116 Intervertebral disc disorders with radiculopathy, lumbar region: Secondary | ICD-10-CM

## 2023-11-21 DIAGNOSIS — Z7982 Long term (current) use of aspirin: Secondary | ICD-10-CM | POA: Insufficient documentation

## 2023-11-21 DIAGNOSIS — I110 Hypertensive heart disease with heart failure: Secondary | ICD-10-CM | POA: Diagnosis not present

## 2023-11-21 DIAGNOSIS — E119 Type 2 diabetes mellitus without complications: Secondary | ICD-10-CM | POA: Insufficient documentation

## 2023-11-21 DIAGNOSIS — I5021 Acute systolic (congestive) heart failure: Secondary | ICD-10-CM | POA: Diagnosis not present

## 2023-11-21 DIAGNOSIS — Z01818 Encounter for other preprocedural examination: Secondary | ICD-10-CM

## 2023-11-21 HISTORY — PX: LUMBAR LAMINECTOMY/DECOMPRESSION MICRODISCECTOMY: SHX5026

## 2023-11-21 LAB — GLUCOSE, CAPILLARY
Glucose-Capillary: 191 mg/dL — ABNORMAL HIGH (ref 70–99)
Glucose-Capillary: 177 mg/dL — ABNORMAL HIGH (ref 70–99)
Glucose-Capillary: 188 mg/dL — ABNORMAL HIGH (ref 70–99)
Glucose-Capillary: 196 mg/dL — ABNORMAL HIGH (ref 70–99)

## 2023-11-21 SURGERY — LUMBAR LAMINECTOMY/DECOMPRESSION MICRODISCECTOMY 1 LEVEL
Anesthesia: General | Site: Spine Lumbar | Laterality: Left

## 2023-11-21 MED ORDER — CHLORHEXIDINE GLUCONATE CLOTH 2 % EX PADS: 6.0000 | MEDICATED_PAD | Freq: Once | CUTANEOUS | Status: DC

## 2023-11-21 MED ORDER — LIDOCAINE 2% (20 MG/ML) 5 ML SYRINGE
INTRAMUSCULAR | Status: DC | PRN
  Administered 2023-11-21: 100 mg via INTRAVENOUS

## 2023-11-21 MED ORDER — HYDROMORPHONE HCL 1 MG/ML IJ SOLN
INTRAMUSCULAR | Status: AC
  Filled 2023-11-21: qty 0.5

## 2023-11-21 MED ORDER — FENTANYL CITRATE (PF) 250 MCG/5ML IJ SOLN
INTRAMUSCULAR | Status: DC | PRN
  Administered 2023-11-21: 100 ug via INTRAVENOUS
  Administered 2023-11-21 (×2): 50 ug via INTRAVENOUS

## 2023-11-21 MED ORDER — SODIUM CHLORIDE 0.9% FLUSH: 3.0000 mL | Freq: Two times a day (BID) | INTRAVENOUS | Status: DC

## 2023-11-21 MED ORDER — ORAL CARE MOUTH RINSE: 15.0000 mL | Freq: Once | OROMUCOSAL | Status: AC

## 2023-11-21 MED ORDER — CHLORHEXIDINE GLUCONATE 0.12 % MT SOLN
15.0000 mL | Freq: Once | OROMUCOSAL | Status: AC
  Administered 2023-11-21: 15 mL via OROMUCOSAL
  Filled 2023-11-21: qty 15

## 2023-11-21 MED ORDER — PROPOFOL 10 MG/ML IV BOLUS
INTRAVENOUS | Status: DC | PRN
  Administered 2023-11-21: 40 mg via INTRAVENOUS
  Administered 2023-11-21: 70 mg via INTRAVENOUS

## 2023-11-21 MED ORDER — INSULIN ASPART 100 UNIT/ML IJ SOLN: 0.0000 [IU] | INTRAMUSCULAR | Status: DC | PRN

## 2023-11-21 MED ORDER — MEPERIDINE HCL 25 MG/ML IJ SOLN: 6.2500 mg | INTRAMUSCULAR | Status: DC | PRN

## 2023-11-21 MED ORDER — SUGAMMADEX SODIUM 200 MG/2ML IV SOLN
INTRAVENOUS | Status: DC | PRN
  Administered 2023-11-21: 400 mg via INTRAVENOUS

## 2023-11-21 MED ORDER — ONDANSETRON HCL 4 MG/2ML IJ SOLN: 4.0000 mg | Freq: Four times a day (QID) | INTRAMUSCULAR | Status: DC | PRN

## 2023-11-21 MED ORDER — DEXAMETHASONE SODIUM PHOSPHATE 10 MG/ML IJ SOLN
INTRAMUSCULAR | Status: DC | PRN
  Administered 2023-11-21: 5 mg via INTRAVENOUS

## 2023-11-21 MED ORDER — FENTANYL CITRATE (PF) 250 MCG/5ML IJ SOLN
INTRAMUSCULAR | Status: AC
  Filled 2023-11-21: qty 5

## 2023-11-21 MED ORDER — ACETAMINOPHEN 325 MG PO TABS: 650.0000 mg | ORAL_TABLET | ORAL | Status: DC | PRN

## 2023-11-21 MED ORDER — ONDANSETRON HCL 4 MG/2ML IJ SOLN
INTRAMUSCULAR | Status: DC | PRN
  Administered 2023-11-21: 4 mg via INTRAVENOUS

## 2023-11-21 MED ORDER — SODIUM CHLORIDE 0.9 % IV SOLN
250.0000 mL | INTRAVENOUS | Status: DC
  Administered 2023-11-21: 250 mL via INTRAVENOUS

## 2023-11-21 MED ORDER — MORPHINE SULFATE (PF) 2 MG/ML IV SOLN: 2.0000 mg | INTRAVENOUS | Status: DC | PRN

## 2023-11-21 MED ORDER — METHOCARBAMOL 500 MG PO TABS: 500.0000 mg | ORAL_TABLET | Freq: Four times a day (QID) | ORAL | Status: DC | PRN

## 2023-11-21 MED ORDER — EPHEDRINE SULFATE-NACL 50-0.9 MG/10ML-% IV SOSY
PREFILLED_SYRINGE | INTRAVENOUS | Status: DC | PRN
  Administered 2023-11-21 (×3): 5 mg via INTRAVENOUS

## 2023-11-21 MED ORDER — THROMBIN 5000 UNITS EX SOLR: OROMUCOSAL | Status: DC | PRN

## 2023-11-21 MED ORDER — AMLODIPINE BESYLATE 10 MG PO TABS
10.0000 mg | ORAL_TABLET | Freq: Every day | ORAL | Status: DC
  Filled 2023-11-21: qty 1

## 2023-11-21 MED ORDER — SODIUM CHLORIDE 0.9% FLUSH: 3.0000 mL | INTRAVENOUS | Status: DC | PRN

## 2023-11-21 MED ORDER — LATANOPROST 0.005 % OP SOLN
1.0000 [drp] | Freq: Every day | OPHTHALMIC | Status: DC
  Filled 2023-11-21: qty 2.5

## 2023-11-21 MED ORDER — DEXAMETHASONE 4 MG PO TABS: 4.0000 mg | ORAL_TABLET | Freq: Four times a day (QID) | ORAL | Status: DC

## 2023-11-21 MED ORDER — DEXMEDETOMIDINE HCL IN NACL 80 MCG/20ML IV SOLN
INTRAVENOUS | Status: AC
  Filled 2023-11-21: qty 20

## 2023-11-21 MED ORDER — THROMBIN 5000 UNITS EX KIT
PACK | CUTANEOUS | Status: AC
  Filled 2023-11-21: qty 1

## 2023-11-21 MED ORDER — LOSARTAN POTASSIUM 25 MG PO TABS: 25.0000 mg | ORAL_TABLET | Freq: Every day | ORAL | Status: DC

## 2023-11-21 MED ORDER — 0.9 % SODIUM CHLORIDE (POUR BTL) OPTIME
TOPICAL | Status: DC | PRN
  Administered 2023-11-21: 1000 mL

## 2023-11-21 MED ORDER — CEFAZOLIN SODIUM-DEXTROSE 2-4 GM/100ML-% IV SOLN
2.0000 g | INTRAVENOUS | Status: AC
  Administered 2023-11-21: 2 g via INTRAVENOUS
  Filled 2023-11-21: qty 100

## 2023-11-21 MED ORDER — ROCURONIUM BROMIDE 10 MG/ML (PF) SYRINGE
PREFILLED_SYRINGE | INTRAVENOUS | Status: DC | PRN
  Administered 2023-11-21: 60 mg via INTRAVENOUS

## 2023-11-21 MED ORDER — OXYCODONE HCL 5 MG/5ML PO SOLN: 5.0000 mg | Freq: Once | ORAL | Status: DC | PRN

## 2023-11-21 MED ORDER — MIDAZOLAM HCL 2 MG/2ML IJ SOLN: 0.5000 mg | Freq: Once | INTRAMUSCULAR | Status: DC | PRN

## 2023-11-21 MED ORDER — HYDROCODONE-ACETAMINOPHEN 5-325 MG PO TABS: 1.0000 | ORAL_TABLET | ORAL | 0 refills | Status: DC | PRN

## 2023-11-21 MED ORDER — ONDANSETRON HCL 4 MG PO TABS: 4.0000 mg | ORAL_TABLET | Freq: Four times a day (QID) | ORAL | Status: DC | PRN

## 2023-11-21 MED ORDER — LEVOTHYROXINE SODIUM 25 MCG PO TABS: 50.0000 ug | ORAL_TABLET | Freq: Every day | ORAL | Status: DC

## 2023-11-21 MED ORDER — MENTHOL 3 MG MT LOZG: 1.0000 | LOZENGE | OROMUCOSAL | Status: DC | PRN

## 2023-11-21 MED ORDER — OXYCODONE HCL 5 MG PO TABS: 5.0000 mg | ORAL_TABLET | Freq: Once | ORAL | Status: DC | PRN

## 2023-11-21 MED ORDER — SUCCINYLCHOLINE CHLORIDE 200 MG/10ML IV SOSY
PREFILLED_SYRINGE | INTRAVENOUS | Status: DC | PRN
  Administered 2023-11-21: 100 mg via INTRAVENOUS

## 2023-11-21 MED ORDER — CELECOXIB 200 MG PO CAPS
200.0000 mg | ORAL_CAPSULE | Freq: Two times a day (BID) | ORAL | Status: DC
  Administered 2023-11-21: 200 mg via ORAL
  Filled 2023-11-21: qty 1

## 2023-11-21 MED ORDER — PHENYLEPHRINE HCL-NACL 20-0.9 MG/250ML-% IV SOLN
INTRAVENOUS | Status: DC | PRN
  Administered 2023-11-21: 80 ug via INTRAVENOUS

## 2023-11-21 MED ORDER — GABAPENTIN 300 MG PO CAPS
300.0000 mg | ORAL_CAPSULE | ORAL | Status: AC
Start: 2023-11-21 — End: 2023-11-21
  Administered 2023-11-21: 300 mg via ORAL
  Filled 2023-11-21: qty 1

## 2023-11-21 MED ORDER — DEXAMETHASONE SODIUM PHOSPHATE 4 MG/ML IJ SOLN: 4.0000 mg | Freq: Four times a day (QID) | INTRAMUSCULAR | Status: DC

## 2023-11-21 MED ORDER — HYDROCODONE-ACETAMINOPHEN 5-325 MG PO TABS: 1.0000 | ORAL_TABLET | ORAL | Status: DC | PRN

## 2023-11-21 MED ORDER — ACETAMINOPHEN 650 MG RE SUPP: 650.0000 mg | RECTAL | Status: DC | PRN

## 2023-11-21 MED ORDER — FENTANYL CITRATE (PF) 100 MCG/2ML IJ SOLN: 25.0000 ug | INTRAMUSCULAR | Status: DC | PRN

## 2023-11-21 MED ORDER — BUPIVACAINE HCL (PF) 0.25 % IJ SOLN
INTRAMUSCULAR | Status: AC
  Filled 2023-11-21: qty 30

## 2023-11-21 MED ORDER — SENNA 8.6 MG PO TABS: 1.0000 | ORAL_TABLET | Freq: Two times a day (BID) | ORAL | Status: DC

## 2023-11-21 MED ORDER — BUPIVACAINE HCL (PF) 0.25 % IJ SOLN
INTRAMUSCULAR | Status: DC | PRN
  Administered 2023-11-21: 4 mL

## 2023-11-21 MED ORDER — CEFAZOLIN SODIUM-DEXTROSE 2-4 GM/100ML-% IV SOLN: 2.0000 g | Freq: Three times a day (TID) | INTRAVENOUS | Status: DC

## 2023-11-21 MED ORDER — ACETAMINOPHEN 500 MG PO TABS: 1000.0000 mg | ORAL_TABLET | Freq: Once | ORAL | Status: DC

## 2023-11-21 MED ORDER — ACETAMINOPHEN 500 MG PO TABS
1000.0000 mg | ORAL_TABLET | ORAL | Status: AC
  Administered 2023-11-21: 1000 mg via ORAL
  Filled 2023-11-21: qty 2

## 2023-11-21 MED ORDER — METHOCARBAMOL 1000 MG/10ML IJ SOLN: 500.0000 mg | Freq: Four times a day (QID) | INTRAMUSCULAR | Status: DC | PRN

## 2023-11-21 MED ORDER — LACTATED RINGERS IV SOLN: INTRAVENOUS | Status: DC | PRN

## 2023-11-21 MED ORDER — PHENOL 1.4 % MT LIQD: 1.0000 | OROMUCOSAL | Status: DC | PRN

## 2023-11-21 SURGICAL SUPPLY — 37 items
BAG COUNTER SPONGE SURGICOUNT (BAG) ×2 IMPLANT
BAND RUBBER #18 3X1/16 STRL (MISCELLANEOUS) ×4 IMPLANT
BENZOIN TINCTURE PRP APPL 2/3 (GAUZE/BANDAGES/DRESSINGS) ×2 IMPLANT
BUR CARBIDE MATCH 3.0 (BURR) ×2 IMPLANT
CANISTER SUCT 3000ML PPV (MISCELLANEOUS) ×2 IMPLANT
DERMABOND ADVANCED .7 DNX12 (GAUZE/BANDAGES/DRESSINGS) IMPLANT
DRAPE LAPAROTOMY 100X72X124 (DRAPES) ×2 IMPLANT
DRAPE MICROSCOPE SLANT 54X150 (MISCELLANEOUS) ×2 IMPLANT
DRAPE SURG 17X23 STRL (DRAPES) ×2 IMPLANT
DRSG OPSITE POSTOP 4X6 (GAUZE/BANDAGES/DRESSINGS) IMPLANT
DURAPREP 26ML APPLICATOR (WOUND CARE) ×2 IMPLANT
ELECT REM PT RETURN 9FT ADLT (ELECTROSURGICAL) ×1 IMPLANT
ELECTRODE REM PT RTRN 9FT ADLT (ELECTROSURGICAL) ×2 IMPLANT
GAUZE 4X4 16PLY ~~LOC~~+RFID DBL (SPONGE) IMPLANT
GLOVE BIO SURGEON STRL SZ7 (GLOVE) IMPLANT
GLOVE BIO SURGEON STRL SZ8 (GLOVE) ×2 IMPLANT
GLOVE BIOGEL PI IND STRL 7.0 (GLOVE) IMPLANT
GOWN STRL REUS W/ TWL LRG LVL3 (GOWN DISPOSABLE) IMPLANT
GOWN STRL REUS W/ TWL XL LVL3 (GOWN DISPOSABLE) ×2 IMPLANT
GOWN STRL REUS W/TWL 2XL LVL3 (GOWN DISPOSABLE) IMPLANT
HEMOSTAT POWDER KIT SURGIFOAM (HEMOSTASIS) ×2 IMPLANT
KIT BASIN OR (CUSTOM PROCEDURE TRAY) ×2 IMPLANT
KIT TURNOVER KIT B (KITS) ×2 IMPLANT
NDL HYPO 25X1 1.5 SAFETY (NEEDLE) ×2 IMPLANT
NDL SPNL 20GX3.5 QUINCKE YW (NEEDLE) IMPLANT
NEEDLE HYPO 25X1 1.5 SAFETY (NEEDLE) ×1 IMPLANT
NEEDLE SPNL 20GX3.5 QUINCKE YW (NEEDLE) IMPLANT
NS IRRIG 1000ML POUR BTL (IV SOLUTION) ×2 IMPLANT
PACK LAMINECTOMY NEURO (CUSTOM PROCEDURE TRAY) ×2 IMPLANT
PAD ARMBOARD 7.5X6 YLW CONV (MISCELLANEOUS) ×6 IMPLANT
STRIP CLOSURE SKIN 1/2X4 (GAUZE/BANDAGES/DRESSINGS) ×2 IMPLANT
SUT VIC AB 0 CT1 18XCR BRD8 (SUTURE) ×2 IMPLANT
SUT VIC AB 2-0 CP2 18 (SUTURE) ×2 IMPLANT
SUT VIC AB 3-0 SH 8-18 (SUTURE) ×2 IMPLANT
TOWEL GREEN STERILE (TOWEL DISPOSABLE) ×2 IMPLANT
TOWEL GREEN STERILE FF (TOWEL DISPOSABLE) ×2 IMPLANT
WATER STERILE IRR 1000ML POUR (IV SOLUTION) ×2 IMPLANT

## 2023-11-21 NOTE — Discharge Summary (Signed)
 Physician Discharge Summary  Patient ID: Omar Haley MRN: [id number] DOB/AGE: 88-Nov-1932 88 y.o.  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 The patient tolerated the procedure well and was taken to the recovery room and then to the floor in stable condition. The hospital course was routine. There were no complications. The wound remained clean dry and intact. Pt had appropriate back soreness. No complaints of leg pain or new N/T/W. The patient remained afebrile with stable vital signs, and tolerated a regular diet. The patient continued to increase activities, and pain was well controlled with oral pain medications.   Consults: None  Significant Diagnostic Studies:  Results for orders placed or performed during the hospital encounter of 11/21/23  Glucose, capillary   Collection Time: 11/21/23  8:52 AM  Result Value Ref Range   Glucose-Capillary 191 (H) 70 - 99 mg/dL   Comment 1 Notify RN   Glucose, capillary   Collection Time: 11/21/23 10:53 AM  Result Value Ref Range   Glucose-Capillary 177 (H) 70 - 99 mg/dL  Glucose, capillary   Collection Time: 11/21/23  1:29 PM  Result Value Ref Range   Glucose-Capillary 188 (H) 70 - 99 mg/dL    No results found.  Antibiotics:  Anti-infectives (From admission, onward)    Start     Dose/Rate Route Frequency Ordered Stop   11/21/23 2000  ceFAZolin (ANCEF) IVPB 2g/100 mL premix        2 g 200 mL/hr over 30 Minutes Intravenous Every 8 hours 11/21/23 1408 11/22/23 1159   11/21/23 0845  ceFAZolin (ANCEF) IVPB 2g/100 mL premix        2 g 200 mL/hr over 30 Minutes Intravenous On call to O.R. 11/21/23 0841 11/21/23 1219       Discharge Exam: Blood pressure (!) 178/77, pulse 86,  temperature 97.7 F (36.5 C), temperature source Oral, resp. rate 20, height 5\' 9"  (1.753 m), weight 72.6 kg, SpO2 98%. Neurologic: Grossly normal Dressing dry  Discharge Medications:   Allergies as of 11/21/2023       Reactions   Enalapril Cough   Gemfibrozil Other (See Comments)   Unknown   Micardis [telmisartan] Diarrhea   Spironolactone Itching   Scalp itching   Zetia [ezetimibe]    laryngitis   Coreg [carvedilol] Rash   Metformin Hcl Rash        Medication List     TAKE these medications    acetaminophen 650 MG CR tablet Commonly known as: TYLENOL Take 650 mg by mouth every 8 (eight) hours as needed for pain.   amLODipine 10 MG tablet Commonly known as: NORVASC Take 1 tablet (10 mg total) by mouth daily.   amoxicillin 500 MG tablet Commonly known as: AMOXIL Take 4 tablets by mouth 1 hour prior to dental procedures and cleanings.   Aspirin 81 MG EC tablet Take 81 mg by mouth daily.   HYDROcodone-acetaminophen 5-325 MG tablet Commonly known as: NORCO/VICODIN Take 1 tablet by mouth every 4 (four) hours as needed for severe pain (pain score 7-10). What changed: how much to take   latanoprost 0.005 % ophthalmic solution Commonly known as: XALATAN Place 1 drop into both eyes at bedtime.   levothyroxine 50 MCG tablet Commonly known as: SYNTHROID Take 50 mcg by mouth daily before  breakfast.   losartan 25 MG tablet Commonly known as: COZAAR Take 1 tablet (25 mg total) by mouth at bedtime.        Disposition: home   Final Dx: L L4-5 microdiskectomy  Discharge Instructions      Remove dressing in 72 hours   Complete by: As directed    Call MD for:  difficulty breathing, headache or visual disturbances   Complete by: As directed    Call MD for:  persistant nausea and vomiting   Complete by: As directed    Call MD for:  redness, tenderness, or signs of infection (pain, swelling, redness, odor or green/yellow discharge around incision site)   Complete  by: As directed    Call MD for:  severe uncontrolled pain   Complete by: As directed    Call MD for:  temperature >100.4   Complete by: As directed    Diet - low sodium heart healthy   Complete by: As directed    Increase activity slowly   Complete by: As directed           Signed: Tia Alert 11/21/2023, 3:30 PM

## 2023-11-21 NOTE — Anesthesia Postprocedure Evaluation (Signed)
 Anesthesia Post Note  Patient: Omar Haley  Procedure(s) Performed: Left Lumbar Four-Five Microdiscectomy (Left: Spine Lumbar)     Patient location during evaluation: PACU Anesthesia Type: General Level of consciousness: patient cooperative, oriented and sedated Pain management: pain level controlled Vital Signs Assessment: post-procedure vital signs reviewed and stable Respiratory status: spontaneous breathing, nonlabored ventilation and respiratory function stable Cardiovascular status: blood pressure returned to baseline and stable Postop Assessment: no apparent nausea or vomiting Anesthetic complications: no   No notable events documented.  Last Vitals:  Vitals:   11/21/23 1409 11/21/23 1607  BP: (!) 178/77 (!) 151/68  Pulse: 86 94  Resp: 20 20  Temp: 36.5 C 37.2 C  SpO2: 98% 98%    Last Pain:  Vitals:   11/21/23 1444  TempSrc:   PainSc: 0-No pain                 Obi Scrima,E. Shaida Route

## 2023-11-21 NOTE — Op Note (Signed)
 11/21/2023  1:13 PM  PATIENT:  Omar Haley  89 y.o. 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 Imaging showed a herniated lumbar disc L4-5 left. The patient tried conservative measures without relief. Pain was debilitating. Recommended left L4-5 microdiscectomy. Patient understood the risks, benefits, and alternatives and potential outcomes and wished to proceed.  PROCEDURE DETAILS: The patient was taken to the operating room and after induction of adequate generalized endotracheal anesthesia, the patient was rolled into the prone position on the Wilson frame and all pressure points were padded. The lumbar region was cleaned and then prepped with DuraPrep and draped in the usual sterile fashion. 5 cc of local anesthesia was injected and then a dorsal midline incision was made and carried down to the lumbo sacral fascia. The fascia was opened and the paraspinous musculature was taken down in a subperiosteal fashion to expose L4-5 on the left. Intraoperative x-ray confirmed my level, and then I used a combination of the high-speed drill and the Kerrison punches to perform a hemilaminectomy, medial facetectomy, and foraminotomy at L4 5 on the left. The underlying yellow ligament was opened and removed in a piecemeal fashion to expose the underlying dura and exiting nerve root. I undercut the lateral recess and dissected down until I was medial to and distal to the pedicle.  The nerve root was well decompressed. We then gently retracted the nerve root medially with a retractor, coagulated the epidural venous vasculature, found a large subannular disc herniation, and incised the disc space.  We performed a thorough intradiscal discectomy with pituitary rongeurs and curettes, until I had a nice decompression of the nerve root and the midline. I then palpated with a coronary dilator along the nerve root and into the foramen to assure adequate decompression. I felt no more compression of the nerve root. I irrigated with saline solution containing bacitracin. Achieved hemostasis with bipolar cautery, lined the dura with Gelfoam, and then closed the fascia with 0 Vicryl. I closed the subcutaneous tissues with 2-0 Vicryl and the subcuticular tissues with 3-0 Vicryl. The skin was then closed with benzoin and Steri-Strips. The drapes were removed, a sterile dressing was applied.  My nurse practitioner was involved in the exposure, safe retraction of the neural elements, the disc work and the closure. the patient was awakened from general anesthesia and transferred to the recovery room in stable condition. At the end of the procedure all sponge, needle and instrument counts were correct.    PLAN OF CARE: Admit for overnight observation  PATIENT DISPOSITION:  PACU - hemodynamically stable.   Delay start of Pharmacological VTE agent (>24hrs) due to surgical blood loss or risk of bleeding:  yes

## 2023-11-21 NOTE — Transfer of Care (Signed)
 Immediate Anesthesia Transfer of Care Note  Patient: Omar Haley  Procedure(s) Performed: Left Lumbar Four-Five Microdiscectomy (Left: Spine Lumbar)  Patient Location: PACU  Anesthesia Type:General  Level of Consciousness: awake, oriented, and drowsy  Airway & Oxygen Therapy: Patient Spontanous Breathing and Patient connected to face mask oxygen  Post-op Assessment: Report given to RN and Post -op Vital signs reviewed and stable  Post vital signs: Reviewed and stable  Last Vitals:  Vitals Value Taken Time  BP 172/76 11/21/23 1320  Temp    Pulse 91 11/21/23 1325  Resp 25 11/21/23 1325  SpO2 100 % 11/21/23 1325  Vitals shown include unfiled device data.  Last Pain:  Vitals:   11/21/23 0922  TempSrc:   PainSc: 6       Patients Stated Pain Goal: 4 (11/21/23 0921)  Complications: No notable events documented.

## 2023-11-21 NOTE — Anesthesia Procedure Notes (Signed)
 Procedure Name: Intubation Date/Time: 11/21/2023 12:01 PM  Performed by: Jimmey Ralph, CRNAPre-anesthesia Checklist: Patient identified, Emergency Drugs available, Suction available and Patient being monitored Patient Re-evaluated:Patient Re-evaluated prior to induction Oxygen Delivery Method: Circle system utilized Preoxygenation: Pre-oxygenation with 100% oxygen Induction Type: IV induction Ventilation: Mask ventilation without difficulty Laryngoscope Size: Mac and 4 Grade View: Grade II Tube type: Oral Tube size: 7.5 mm Number of attempts: 1 Airway Equipment and Method: Stylet Placement Confirmation: ETT inserted through vocal cords under direct vision, positive ETCO2 and breath sounds checked- equal and bilateral Secured at: 23 cm Tube secured with: Tape Dental Injury: Teeth and Oropharynx as per pre-operative assessment

## 2023-11-21 NOTE — H&P (Signed)
 Subjective: Patient is a 89 y.o. male admitted for l leg pain. Onset of symptoms was several months ago, gradually worsening since that time.  The pain is rated severe, and is located at the across the lower back and radiates to LLE. The pain is described as aching and occurs all day. The symptoms have been progressive. Symptoms are exacerbated by exercise and standing. 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 Lynnette Caffey & Dr. Laneta Simmers   Unifocal PVCs    Vitamin D deficiency     Past Surgical History:  Procedure Laterality Date   APPENDECTOMY     COLON SURGERY  09/13/1989   rt hemicolectomy   HERNIA REPAIR     INTRAOPERATIVE TRANSTHORACIC ECHOCARDIOGRAM N/A 01/18/2023   Procedure: INTRAOPERATIVE TRANSTHORACIC ECHOCARDIOGRAM;  Surgeon: [name], MD;  Location: [facility];  Service: Open Heart Surgery;  Laterality: N/A;   MELANOMA EXCISION  11/2005&04/2011   skin   RIGHT/LEFT HEART CATH AND CORONARY ANGIOGRAPHY N/A 01/06/2023   Procedure: RIGHT/LEFT HEART CATH AND CORONARY ANGIOGRAPHY;  Surgeon: [name], MD;  Location: [facility];  Service: Cardiovascular;  Laterality: N/A;   TRANSCATHETER AORTIC VALVE REPLACEMENT, TRANSFEMORAL N/A 01/18/2023   Procedure: Transcatheter Aortic Valve Replacement, Transfemoral;  Surgeon: [name], MD;  Location: [facility];  Service: Open Heart Surgery;  Laterality: N/A;    Prior to Admission medications   Medication Sig Start Date End Date Taking? Authorizing Provider  acetaminophen (TYLENOL) 650 MG CR tablet Take 650 mg by mouth every 8 (eight) hours as needed for pain.   Yes [provider]  amLODipine (NORVASC) 10 MG tablet Take 1 tablet (10 mg total) by mouth daily. 02/18/23 11/11/23 Yes Janetta Hora, PA-C  Aspirin 81 MG EC tablet Take 81 mg by mouth daily.   Yes [provider]  HYDROcodone-acetaminophen (NORCO/VICODIN) 5-325 MG tablet Take 2 tablets by mouth every 4 (four) hours as needed for severe pain (pain score 7-10). 08/15/23  Yes Roemhildt, Lorin T, PA-C  latanoprost (XALATAN) 0.005 % ophthalmic solution Place 1 drop into both eyes at bedtime.   Yes [provider]  levothyroxine (SYNTHROID) 50 MCG tablet Take 50 mcg by mouth daily before breakfast. 04/05/18  Yes [provider]  losartan (COZAAR) 25 MG tablet Take 1 tablet (25 mg total) by mouth at bedtime. 04/13/23  Yes [name], MD  amoxicillin (AMOXIL) 500 MG tablet Take 4 tablets by mouth 1 hour prior to dental procedures and cleanings. 01/26/23   Janetta Hora, PA-C   Allergies  Allergen Reactions   Enalapril Cough   Gemfibrozil Other (See Comments)    Unknown   Micardis [Telmisartan] Diarrhea   Spironolactone Itching  Scalp itching   Zetia [Ezetimibe]     laryngitis   Coreg [Carvedilol] Rash   Metformin Hcl Rash    Social History   Tobacco Use   Smoking status: Never   Smokeless tobacco: Never  Substance Use Topics   Alcohol use: No    Family History  Problem Relation Age of Onset   Heart failure Mother    Cerebral aneurysm Father      Review of Systems  Positive ROS: neg  All other systems have been reviewed and were otherwise negative with the exception of those mentioned in the HPI and as above.  Objective: Vital signs in last 24 hours: Temp:  [97.6 F (36.4  C)] 97.6 F (36.4 C) (03/10 0851) Pulse Rate:  [93] 93 (03/10 0851) Resp:  [18] 18 (03/10 0851) BP: (197)/(67) 197/67 (03/10 0851) SpO2:  [96 %] 96 % (03/10 0851) Weight:  [72.6 kg] 72.6 kg (03/10 0851)  General Appearance: Alert, cooperative, no distress, appears stated age Head: Normocephalic, without obvious abnormality, atraumatic Eyes: PERRL, conjunctiva/corneas clear, EOM's intact    Neck: Supple, symmetrical, trachea midline Back: Symmetric, no curvature, ROM normal, no CVA tenderness Lungs:  respirations unlabored Heart: Regular rate and rhythm Abdomen: Soft, non-tender Extremities: Extremities normal, atraumatic, no cyanosis or edema Pulses: 2+ and symmetric all extremities Skin: Skin color, texture, turgor normal, no rashes or lesions  NEUROLOGIC:   Mental status: Alert and oriented x4,  no aphasia, good attention span, fund of knowledge, and memory Motor Exam - grossly normal Sensory Exam - grossly normal Reflexes: 1= Coordination - grossly normal Gait - grossly normal Balance - grossly normal Cranial Nerves: I: smell Not tested  II: visual acuity  OS: nl    OD: nl  II: visual fields Full to confrontation  II: pupils Equal, round, reactive to light  III,VII: ptosis None  III,IV,VI: extraocular muscles  Full ROM  V: mastication Normal  V: facial light touch sensation  Normal  V,VII: corneal reflex  Present  VII: facial muscle function - upper  Normal  VII: facial muscle function - lower Normal  VIII: hearing Not tested  IX: soft palate elevation  Normal  IX,X: gag reflex Present  XI: trapezius strength  5/5  XI: sternocleidomastoid strength 5/5  XI: neck flexion strength  5/5  XII: tongue strength  Normal    Data Review Lab Results  Component Value Date   WBC 4.6 11/15/2023   HGB 13.2 11/15/2023   HCT 40.1 11/15/2023   MCV 86.2 11/15/2023   PLT 156 11/15/2023   Lab Results  Component Value Date   NA 139 11/15/2023   K 4.3 11/15/2023   CL 101  11/15/2023   CO2 24 11/15/2023   BUN 11 11/15/2023   CREATININE 0.79 11/15/2023   GLUCOSE 215 (H) 11/15/2023   Lab Results  Component Value Date   INR 1.0 11/15/2023    Assessment/Plan:  Estimated body mass index is 23.63 kg/m as calculated from the following:   Height as of this encounter: 5\' 9"  (1.753 m).   Weight as of this encounter: 72.6 kg. Patient admitted for L L4-5 microdiskectomy. Patient has failed a reasonable attempt at conservative therapy.  I explained the condition and procedure to the patient and answered any questions.  Patient wishes to proceed with procedure as planned. Understands risks/ benefits and typical outcomes of procedure.   Tia Alert 11/21/2023 10:58 AM

## 2023-11-21 NOTE — Evaluation (Signed)
 Occupational Therapy Evaluation Patient Details Name: Omar Haley MRN: [id number] DOB: 1931-03-03 Today's Date: 11/21/2023   History of Present Illness   Omar Haley is a 89 y.o. male presenting s/p Left Lumbar Four-Five Microdiscectomy on 11/21/2023. PMHx includes Aortic stenosis, cancer, DM, DJD, dupuytren's contracture, glaucoma, HLD, HTN, neuropathy, prostatitis, Vitamin D deficiency     Clinical Impressions Pt was evaluated s/p admission list above. At baseline, pt lives at home with his wife, drives, completes yard work, and all ADLs/IADLs with up to MOD I using RW. Upon evaluation, pt was limited by knowledge of back precautions and compensatory strategies. Overall, pt required up to supervision for all aspects of functional mobility using RW with MIN verbal cues to adhere to back precautions functionally. Pt able to achieve figure 4 seated EOB to safely engage in LB ADLs without reports of pain or discomfort. Based on evaluation, pt will require up to supervision to complete ADLs to ensure pt adheres to back precautions. Pt wife was present and very supportive. Pt does not have acute OT needs. Recommends pt discharge home with family support and without follow-up OT services.     If plan is discharge home, recommend the following:   A little help with walking and/or transfers;A little help with bathing/dressing/bathroom;Assistance with cooking/housework;Assist for transportation;Help with stairs or ramp for entrance     Functional Status Assessment   Patient has had a recent decline in their functional status and demonstrates the ability to make significant improvements in function in a reasonable and predictable amount of time.     Equipment Recommendations   None recommended by OT     Recommendations for Other Services         Precautions/Restrictions   Precautions Precautions: Back;Fall Precaution Booklet Issued: Yes (comment) Precaution/Restrictions  Comments: Pt provided with handout of back precautions. Handout was reviewed with pt and wife Restrictions Weight Bearing Restrictions Per Provider Order: No     Mobility Bed Mobility Overal bed mobility: Needs Assistance Bed Mobility: Supine to Sit, Sit to Supine     Supine to sit: HOB elevated, Supervision Sit to supine: HOB elevated, Supervision   General bed mobility comments: Pt provided with education of log roll technique to safely complete bed mobility. Pt completed bed mobility with supervision and verbal cues for sequencing.    Transfers Overall transfer level: Needs assistance Equipment used: Rolling walker (2 wheels) Transfers: Sit to/from Stand Sit to Stand: Supervision           General transfer comment: Pt completed STS from EOB to RW. Pt did not require physical assistance. supervision provided for safety      Balance Overall balance assessment: Needs assistance Sitting-balance support: No upper extremity supported, Feet supported Sitting balance-Leahy Scale: Good Sitting balance - Comments: Pt demonstrated figure 4 while seated EOB. No LOB observed.   Standing balance support: Bilateral upper extremity supported, During functional activity                               ADL either performed or assessed with clinical judgement   ADL Overall ADL's : Needs assistance/impaired Eating/Feeding: Independent   Grooming: Standing;Supervision/safety   Upper Body Bathing: Modified independent;Sitting   Lower Body Bathing: Sit to/from stand;Sitting/lateral leans;Adhering to back precautions;Supervison/ safety   Upper Body Dressing : Modified independent;Sitting   Lower Body Dressing: Sit to/from stand;Adhering to back precautions;Supervision/safety   Toilet Transfer: Ambulation;Regular Toilet;Supervision/safety   Toileting- Clothing Manipulation and  Hygiene: Sit to/from stand;Adhering to back precautions;Supervision/safety       Functional  mobility during ADLs: Supervision/safety General ADL Comments: Pt provided with up to supervision for functional mobility tasks to ensure pt adheres to back precautions. Pt able to achieve figure 4 seated EOB to safely engage in LB ADLs. Pt followed verbal cues without difficulty but required MIN verbal cues to adhere to back precautions functionally.     Vision Baseline Vision/History: 1 Wears glasses Ability to See in Adequate Light: 0 Adequate Patient Visual Report: No change from baseline Vision Assessment?: No apparent visual deficits     Perception Perception: Within Functional Limits       Praxis Praxis: Not tested       Pertinent Vitals/Pain Pain Assessment Pain Assessment: No/denies pain     Extremity/Trunk Assessment Upper Extremity Assessment Upper Extremity Assessment: Overall WFL for tasks assessed   Lower Extremity Assessment Lower Extremity Assessment: Overall WFL for tasks assessed   Cervical / Trunk Assessment Cervical / Trunk Assessment: Back Surgery   Communication Communication Communication: No apparent difficulties   Cognition Arousal: Alert Behavior During Therapy: WFL for tasks assessed/performed Cognition: No apparent impairments             OT - Cognition Comments: Pt followed verbal commands, answered questions appropriately, alert throughout session                 Following commands: Intact       Cueing  General Comments   Cueing Techniques: Verbal cues  VSS on RA   Exercises     Shoulder Instructions      Home Living Family/patient expects to be discharged to:: Private residence Living Arrangements: Spouse/significant other Available Help at Discharge: Family;Available 24 hours/day Type of Home: House Home Access: Stairs to enter [employer] of Steps: 16 to enter through garage. 6-7 to enter through front door Entrance Stairs-Rails: Right;Left;Can reach both Home Layout: One level     Bathroom  Shower/Tub: Tub/shower unit;Walk-in shower;Door   Bathroom Toilet: Standard (Has both standard and comfort height commodes)     Home Equipment: Agricultural consultant (2 wheels);Shower seat   Additional Comments: Pt wife reports grab bars can be installed in shower if needed      Prior Functioning/Environment Prior Level of Function : Independent/Modified Independent;Driving             Mobility Comments: Completed functional ambulation using RW for the last 2-3 months due to pain in LLE. ADLs Comments: Completed ADLs/IADLs (yard work, Education administrator) independently.    OT Problem List: Decreased strength;Impaired balance (sitting and/or standing);Decreased knowledge of precautions   OT Treatment/Interventions:        OT Goals(Current goals can be found in the care plan section)   Acute Rehab OT Goals Patient Stated Goal: to go home OT Goal Formulation: With patient Time For Goal Achievement: 11/21/23 Potential to Achieve Goals: Good   OT Frequency:       Co-evaluation              AM-PAC OT "6 Clicks" Daily Activity     Outcome Measure Help from another person eating meals?: None Help from another person taking care of personal grooming?: None Help from another person toileting, which includes using toliet, bedpan, or urinal?: A Little Help from another person bathing (including washing, rinsing, drying)?: A Little Help from another person to put on and taking off regular upper body clothing?: None Help from another person to put on and  taking off regular lower body clothing?: A Little 6 Click Score: 21   End of Session Equipment Utilized During Treatment: Rolling walker (2 wheels);Gait belt Nurse Communication: Mobility status  Activity Tolerance: Patient tolerated treatment well Patient left: in bed;with call bell/phone within reach;with family/visitor present  OT Visit Diagnosis: Unsteadiness on feet (R26.81);Other abnormalities of gait and mobility  (R26.89);Muscle weakness (generalized) (M62.81);Pain Pain - Right/Left: Left Pain - part of body: Leg                Time: 8295-6213 OT Time Calculation (min): 25 min Charges:  OT General Charges $OT Visit: 1 Visit OT Evaluation $OT Eval Low Complexity: 1 Low OT Treatments $Self Care/Home Management : 8-22 mins  Lynnda Shields 11/21/2023, 5:39 PM

## 2023-11-22 ENCOUNTER — Encounter (HOSPITAL_COMMUNITY): Payer: Self-pay | Admitting: Neurological Surgery

## 2023-12-21 DIAGNOSIS — C61 Malignant neoplasm of prostate: Secondary | ICD-10-CM | POA: Diagnosis not present

## 2023-12-28 DIAGNOSIS — N400 Enlarged prostate without lower urinary tract symptoms: Secondary | ICD-10-CM | POA: Diagnosis not present

## 2023-12-28 DIAGNOSIS — E349 Endocrine disorder, unspecified: Secondary | ICD-10-CM | POA: Diagnosis not present

## 2023-12-28 DIAGNOSIS — Z8546 Personal history of malignant neoplasm of prostate: Secondary | ICD-10-CM | POA: Diagnosis not present

## 2024-01-13 DIAGNOSIS — H26491 Other secondary cataract, right eye: Secondary | ICD-10-CM | POA: Diagnosis not present

## 2024-01-13 DIAGNOSIS — Z961 Presence of intraocular lens: Secondary | ICD-10-CM | POA: Diagnosis not present

## 2024-01-18 ENCOUNTER — Other Ambulatory Visit (HOSPITAL_COMMUNITY): Payer: Medicare Other

## 2024-01-18 ENCOUNTER — Ambulatory Visit: Payer: Medicare Other

## 2024-01-19 ENCOUNTER — Ambulatory Visit (HOSPITAL_COMMUNITY): Payer: Medicare Other | Attending: Cardiology

## 2024-01-19 ENCOUNTER — Encounter: Payer: Self-pay | Admitting: Physician Assistant

## 2024-01-19 ENCOUNTER — Ambulatory Visit: Payer: Medicare Other | Admitting: Physician Assistant

## 2024-01-19 VITALS — BP 168/60 | HR 71 | Ht 70.0 in | Wt 160.0 lb

## 2024-01-19 DIAGNOSIS — Z952 Presence of prosthetic heart valve: Secondary | ICD-10-CM | POA: Diagnosis not present

## 2024-01-19 DIAGNOSIS — I10 Essential (primary) hypertension: Secondary | ICD-10-CM

## 2024-01-19 DIAGNOSIS — I5020 Unspecified systolic (congestive) heart failure: Secondary | ICD-10-CM

## 2024-01-19 DIAGNOSIS — I351 Nonrheumatic aortic (valve) insufficiency: Secondary | ICD-10-CM | POA: Diagnosis not present

## 2024-01-19 LAB — ECHOCARDIOGRAM COMPLETE
AR max vel: 1.65 cm2
AV Area VTI: 1.64 cm2
AV Area mean vel: 1.61 cm2
AV Mean grad: 13.8 mmHg
AV Peak grad: 24.4 mmHg
Ao pk vel: 2.47 m/s
Area-P 1/2: 2.97 cm2
S' Lateral: 2.7 cm

## 2024-01-19 NOTE — Patient Instructions (Signed)
 Medication Instructions:  Your physician has recommended you make the following change in your medication:  RESTART ASPIRIN  81 MG DAILY   *If you need a refill on your cardiac medications before your next appointment, please call your pharmacy*  Lab Work: NONE If you have labs (blood work) drawn today and your tests are completely normal, you will receive your results only by: MyChart Message (if you have MyChart) OR A paper copy in the mail If you have any lab test that is abnormal or we need to change your treatment, we will call you to review the results.  Testing/Procedures: Your physician has requested that you have an echocardiogram. Echocardiography is a painless test that uses sound waves to create images of your heart. It provides your doctor with information about the size and shape of your heart and how well your heart's chambers and valves are working. This procedure takes approximately one hour. There are no restrictions for this procedure. TO BE DONE IN 1 YEAR  Please do NOT wear cologne, perfume, aftershave, or lotions (deodorant is allowed). Please arrive 15 minutes prior to your appointment time.  Please note: We ask at that you not bring children with you during ultrasound (echo/ vascular) testing. Due to room size and safety concerns, children are not allowed in the ultrasound rooms during exams. Our front office staff cannot provide observation of children in our lobby area while testing is being conducted. An adult accompanying a patient to their appointment will only be allowed in the ultrasound room at the discretion of the ultrasound technician under special circumstances. We apologize for any inconvenience.   Follow-Up: At [facility], you and your health needs are our priority.  As part of our continuing mission to provide you with exceptional heart care, our providers are all part of one team.  This team includes your primary Cardiologist (physician) and  Advanced Practice Providers or APPs (Physician Assistants and Nurse Practitioners) who all work together to provide you with the care you need, when you need it.  Your next appointment:   1 year(s)  Provider:   Alyssa Backbone, MD    We recommend signing up for the patient portal called "MyChart".  Sign up information is provided on this After Visit Summary.  MyChart is used to connect with patients for Virtual Visits (Telemedicine).  Patients are able to view lab/test results, encounter notes, upcoming appointments, etc.  Non-urgent messages can be sent to your provider as well.   To learn more about what you can do with MyChart, go to [email or web address].   Other Instructions

## 2024-01-19 NOTE — Progress Notes (Signed)
 [facility]   MULTIDISCIPLINARY HEART VALVE CLINIC                                     Cardiology Office Note:    Date:  01/19/2024   ID:  Omar Haley, DOB 03-Nov-1930, MRN [id number]  PCP:  Barnetta Liberty, MD  [facility] Cardiologist:  Kyra Phy, MD  [facility] Structural heart: Kyra Phy, MD [facility] Electrophysiologist:  None   Referring MD: Barnetta Liberty, MD   1 year s/p TAVR  History of Present Illness:    Omar Haley is a 89 y.o. male with a hx of prostate cancer, LBBB, diet-controlled diabetes, HTN, hypothyroidism, HLD, LV dysfunction and severe aortic stenosis s/p TAVR (08/29/23) who presents to clinic for follow up.    He had a presyncopal episode when walking to the mailbox which prompted cardiology referral. He reported progressive exertional fatigue as well as shortness of breath and chest pain. Echocardiogram on 12/14/2022 showed LV dysfunction (EF 35-40%) and severe AS with mean grad 37 mmHg, peak grad 62.1 mmHg, AVA 0.95 cm2, DVI 0.21, SVI 43 as well as moderate AI/MR. [facility] 01/06/23 showed mild to moderate diffuse nonobstructive coronary artery disease. S/p TAVR with a 26 mm Edwards Sapien 3 THV via the TF approach on 01/18/23. Post operative echo showed EF 55%, normally functioning TAVR with a mean gradient of 10 mmHg and no PVL as well as mild MR. 1 month echo showed EF 50-55%, normally functioning TAVR with a mean gradient of 13.7 mm hg and mild PVL as well as mild to moderate MR. He has had issues with hypertension and LE weakness. Has many medication intolerances.   Had hip pain and underwent L L4-5 microdiskectomy by Dr. Rochelle Chu on 11/21/23. Pt stopped taking aspirin  at that time even though looks like it was continued on discharge summary.   Today the patient presents to clinic for follow up. No CP or SOB. He has chronic mild LE edema that does not bother him. No orthopnea or PND. No dizziness or syncope. No blood in stool or  urine. No palpitations. Had complete resolution of hip pain after microdiskectomy. Still has leg weakness that he attributes to radiation therapy. 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 Lorie Rook & Dr. Sherene Dilling   Unifocal PVCs    Vitamin D deficiency      Current Medications: Current Meds  Medication Sig   acetaminophen  (TYLENOL ) 650 MG CR tablet Take 650 mg by mouth every 8 (eight) hours as needed for pain.   amoxicillin  (AMOXIL ) 500 MG tablet Take 4 tablets by mouth 1 hour prior to  dental procedures and cleanings.   Aspirin  81 MG EC tablet Take 81 mg by mouth daily.   HYDROcodone -acetaminophen  (NORCO/VICODIN) 5-325 MG tablet Take 1 tablet by mouth every 4 (four) hours as needed for severe pain (pain score 7-10).   latanoprost  (XALATAN ) 0.005 % ophthalmic solution Place 1 drop into both eyes at bedtime.   levothyroxine  (SYNTHROID ) 50 MCG tablet Take 50 mcg by mouth daily before breakfast.   losartan  (COZAAR ) 25 MG tablet Take 1 tablet (25 mg total) by mouth at bedtime.      ROS:   Please see the history of present illness.    All other systems reviewed and are negative.  EKGs       Risk Assessment/Calculations:           Physical Exam:    VS:  BP (!) 168/60 (BP Location: Left Arm, Patient Position: Sitting, Cuff Size: Normal)   Pulse 71   Ht 5\' 10"  (1.778  m)   Wt 160 lb (72.6 kg)   SpO2 98%   BMI 22.96 kg/m     Wt Readings from Last 3 Encounters:  01/19/24 160 lb (72.6 kg)  11/21/23 160 lb (72.6 kg)  11/15/23 162 lb 11.2 oz (73.8 kg)     GEN: Well nourished, well developed in no acute distress NECK: No JVD CARDIAC: RRR, no murmurs, rubs, gallops RESPIRATORY:  Clear to auscultation without rales, wheezing or rhonchi  ABDOMEN: Soft, non-tender, non-distended EXTREMITIES:  2+ bilateral LE edema.   ASSESSMENT:    1. S/P TAVR (transcatheter aortic valve replacement)   2. HFrEF (heart failure with reduced ejection fraction) (HCC)   3. Hypertension, unspecified type     PLAN:    In order of problems listed above:  Severe AS s/p TAVR:  -- Echo today shows EF 55%, mod LVH, mild RVE, normally functioning TAVR with a mean gradient of 16 mm hg and mild PVL as well as moderate MAC with mild MR.   -- NYHA class I symptoms.  -- Start back on Aspirin  81mg  daily (stopped after back surgery in March). -- SBE discussed. He has amoxicillin .  -- Continue regular follow up with Dr. Lorie Rook.   HFimpEF:  -- EF normalized. EF 55-60% today.  -- Continue Losartan  25mg  daily.  -- Did not tolerate Coreg  6.5mg  BID or spiro.  -- Has some mild LE edema but this doesn't bother him and has been stable over time. -- Volume status looks good otherwise.    HTN:  -- BP elevated today but better at home per pt and wife. . -- Pt has multiple medication intolerances and says his BP just runs a little high. -- Given age, will be more lenient with BP. He does have LE edema that does not bother him. We could stop Norvasc  or start a diuretic if it ever starts to bother him.  -- Continue Losartan  25mg  daily. -- Continue Norvasc  10mg  daily.    Medication Adjustments/Labs and Tests Ordered: Current medicines are reviewed at length with the patient today.  Concerns regarding medicines are outlined above.  No orders of the defined types were placed in this  encounter.  No orders of the defined types were placed in this encounter.   Patient Instructions  Medication Instructions:  Your physician has recommended you make the following change in your medication:  RESTART ASPIRIN  81 MG DAILY   *If you need a refill on your cardiac medications before your next appointment, please call your pharmacy*  Lab Work: NONE  If you have labs (blood work) drawn today and your tests are completely normal, you will receive your results only by: MyChart Message (if you have MyChart) OR A paper copy in the mail If you have any lab test that is abnormal or we need to change your treatment, we will call you to review the results.  Testing/Procedures: Your physician has requested that you have an echocardiogram. Echocardiography is a painless test that uses sound waves to create images of your heart. It provides your doctor with information about the size and shape of your heart and how well your heart's chambers and valves are working. This procedure takes approximately one hour. There are no restrictions for this procedure. TO BE DONE IN 1 YEAR  Please do NOT wear cologne, perfume, aftershave, or lotions (deodorant is allowed). Please arrive 15 minutes prior to your appointment time.  Please note: We ask at that you not bring children with you during ultrasound (echo/ vascular) testing. Due to room size and safety concerns, children are not allowed in the ultrasound rooms during exams. Our front office staff cannot provide observation of children in our lobby area while testing is being conducted. An adult accompanying a patient to their appointment will only be allowed in the ultrasound room at the discretion of the ultrasound technician under special circumstances. We apologize for any inconvenience.   Follow-Up: At [facility], you and your health needs are our priority.  As part of our continuing mission to provide you with exceptional heart care, our  providers are all part of one team.  This team includes your primary Cardiologist (physician) and Advanced Practice Providers or APPs (Physician Assistants and Nurse Practitioners) who all work together to provide you with the care you need, when you need it.  Your next appointment:   1 year(s)  Provider:   Alyssa Backbone, MD    We recommend signing up for the patient portal called "MyChart".  Sign up information is provided on this After Visit Summary.  MyChart is used to connect with patients for Virtual Visits (Telemedicine).  Patients are able to view lab/test results, encounter notes, upcoming appointments, etc.  Non-urgent messages can be sent to your provider as well.   To learn more about what you can do with MyChart, go to [email or web address].   Other Instructions        Signed, Abagail Hoar, PA-C  01/19/2024 8:34 PM    [facility]

## 2024-01-27 DIAGNOSIS — R599 Enlarged lymph nodes, unspecified: Secondary | ICD-10-CM | POA: Diagnosis not present

## 2024-01-27 DIAGNOSIS — I5022 Chronic systolic (congestive) heart failure: Secondary | ICD-10-CM | POA: Diagnosis not present

## 2024-01-27 DIAGNOSIS — I110 Hypertensive heart disease with heart failure: Secondary | ICD-10-CM | POA: Diagnosis not present

## 2024-01-27 DIAGNOSIS — K869 Disease of pancreas, unspecified: Secondary | ICD-10-CM | POA: Diagnosis not present

## 2024-01-27 DIAGNOSIS — E1169 Type 2 diabetes mellitus with other specified complication: Secondary | ICD-10-CM | POA: Diagnosis not present

## 2024-01-27 DIAGNOSIS — C61 Malignant neoplasm of prostate: Secondary | ICD-10-CM | POA: Diagnosis not present

## 2024-01-27 DIAGNOSIS — I350 Nonrheumatic aortic (valve) stenosis: Secondary | ICD-10-CM | POA: Diagnosis not present

## 2024-01-27 DIAGNOSIS — R29898 Other symptoms and signs involving the musculoskeletal system: Secondary | ICD-10-CM | POA: Diagnosis not present

## 2024-01-27 DIAGNOSIS — Z952 Presence of prosthetic heart valve: Secondary | ICD-10-CM | POA: Diagnosis not present

## 2024-02-15 DIAGNOSIS — Z85820 Personal history of malignant melanoma of skin: Secondary | ICD-10-CM | POA: Diagnosis not present

## 2024-02-15 DIAGNOSIS — C44319 Basal cell carcinoma of skin of other parts of face: Secondary | ICD-10-CM | POA: Diagnosis not present

## 2024-02-15 DIAGNOSIS — X32XXXD Exposure to sunlight, subsequent encounter: Secondary | ICD-10-CM | POA: Diagnosis not present

## 2024-02-15 DIAGNOSIS — Z08 Encounter for follow-up examination after completed treatment for malignant neoplasm: Secondary | ICD-10-CM | POA: Diagnosis not present

## 2024-02-15 DIAGNOSIS — L570 Actinic keratosis: Secondary | ICD-10-CM | POA: Diagnosis not present

## 2024-02-15 DIAGNOSIS — D225 Melanocytic nevi of trunk: Secondary | ICD-10-CM | POA: Diagnosis not present

## 2024-02-15 DIAGNOSIS — Z1283 Encounter for screening for malignant neoplasm of skin: Secondary | ICD-10-CM | POA: Diagnosis not present

## 2024-02-23 DIAGNOSIS — M5416 Radiculopathy, lumbar region: Secondary | ICD-10-CM | POA: Diagnosis not present

## 2024-03-06 ENCOUNTER — Other Ambulatory Visit: Payer: Self-pay | Admitting: Physician Assistant

## 2024-03-08 DIAGNOSIS — H26491 Other secondary cataract, right eye: Secondary | ICD-10-CM | POA: Diagnosis not present

## 2024-04-11 DIAGNOSIS — Z08 Encounter for follow-up examination after completed treatment for malignant neoplasm: Secondary | ICD-10-CM | POA: Diagnosis not present

## 2024-04-11 DIAGNOSIS — X32XXXD Exposure to sunlight, subsequent encounter: Secondary | ICD-10-CM | POA: Diagnosis not present

## 2024-04-11 DIAGNOSIS — Z85828 Personal history of other malignant neoplasm of skin: Secondary | ICD-10-CM | POA: Diagnosis not present

## 2024-04-11 DIAGNOSIS — Z85820 Personal history of malignant melanoma of skin: Secondary | ICD-10-CM | POA: Diagnosis not present

## 2024-04-11 DIAGNOSIS — L570 Actinic keratosis: Secondary | ICD-10-CM | POA: Diagnosis not present

## 2024-04-23 DIAGNOSIS — E785 Hyperlipidemia, unspecified: Secondary | ICD-10-CM | POA: Diagnosis not present

## 2024-04-23 DIAGNOSIS — E039 Hypothyroidism, unspecified: Secondary | ICD-10-CM | POA: Diagnosis not present

## 2024-04-23 DIAGNOSIS — C61 Malignant neoplasm of prostate: Secondary | ICD-10-CM | POA: Diagnosis not present

## 2024-04-23 DIAGNOSIS — E7849 Other hyperlipidemia: Secondary | ICD-10-CM | POA: Diagnosis not present

## 2024-04-23 DIAGNOSIS — E1169 Type 2 diabetes mellitus with other specified complication: Secondary | ICD-10-CM | POA: Diagnosis not present

## 2024-04-23 DIAGNOSIS — I110 Hypertensive heart disease with heart failure: Secondary | ICD-10-CM | POA: Diagnosis not present

## 2024-04-23 DIAGNOSIS — E559 Vitamin D deficiency, unspecified: Secondary | ICD-10-CM | POA: Diagnosis not present

## 2024-04-23 DIAGNOSIS — I5022 Chronic systolic (congestive) heart failure: Secondary | ICD-10-CM | POA: Diagnosis not present

## 2024-04-30 DIAGNOSIS — I878 Other specified disorders of veins: Secondary | ICD-10-CM | POA: Diagnosis not present

## 2024-04-30 DIAGNOSIS — Z1331 Encounter for screening for depression: Secondary | ICD-10-CM | POA: Diagnosis not present

## 2024-04-30 DIAGNOSIS — I350 Nonrheumatic aortic (valve) stenosis: Secondary | ICD-10-CM | POA: Diagnosis not present

## 2024-04-30 DIAGNOSIS — E1169 Type 2 diabetes mellitus with other specified complication: Secondary | ICD-10-CM | POA: Diagnosis not present

## 2024-04-30 DIAGNOSIS — Z0000 Encounter for general adult medical examination without abnormal findings: Secondary | ICD-10-CM | POA: Diagnosis not present

## 2024-04-30 DIAGNOSIS — I5022 Chronic systolic (congestive) heart failure: Secondary | ICD-10-CM | POA: Diagnosis not present

## 2024-04-30 DIAGNOSIS — K869 Disease of pancreas, unspecified: Secondary | ICD-10-CM | POA: Diagnosis not present

## 2024-04-30 DIAGNOSIS — Z1339 Encounter for screening examination for other mental health and behavioral disorders: Secondary | ICD-10-CM | POA: Diagnosis not present

## 2024-04-30 DIAGNOSIS — Z23 Encounter for immunization: Secondary | ICD-10-CM | POA: Diagnosis not present

## 2024-04-30 DIAGNOSIS — C61 Malignant neoplasm of prostate: Secondary | ICD-10-CM | POA: Diagnosis not present

## 2024-04-30 DIAGNOSIS — Z952 Presence of prosthetic heart valve: Secondary | ICD-10-CM | POA: Diagnosis not present

## 2024-04-30 DIAGNOSIS — R82998 Other abnormal findings in urine: Secondary | ICD-10-CM | POA: Diagnosis not present

## 2024-04-30 DIAGNOSIS — E785 Hyperlipidemia, unspecified: Secondary | ICD-10-CM | POA: Diagnosis not present

## 2024-04-30 DIAGNOSIS — E039 Hypothyroidism, unspecified: Secondary | ICD-10-CM | POA: Diagnosis not present

## 2024-04-30 DIAGNOSIS — I110 Hypertensive heart disease with heart failure: Secondary | ICD-10-CM | POA: Diagnosis not present

## 2024-06-05 ENCOUNTER — Other Ambulatory Visit: Payer: Self-pay | Admitting: Internal Medicine

## 2024-06-06 DIAGNOSIS — Z23 Encounter for immunization: Secondary | ICD-10-CM | POA: Diagnosis not present

## 2024-06-12 DIAGNOSIS — L570 Actinic keratosis: Secondary | ICD-10-CM | POA: Diagnosis not present

## 2024-06-12 DIAGNOSIS — C44212 Basal cell carcinoma of skin of right ear and external auricular canal: Secondary | ICD-10-CM | POA: Diagnosis not present

## 2024-06-12 DIAGNOSIS — X32XXXD Exposure to sunlight, subsequent encounter: Secondary | ICD-10-CM | POA: Diagnosis not present

## 2024-07-05 ENCOUNTER — Ambulatory Visit (HOSPITAL_COMMUNITY)
Admission: RE | Admit: 2024-07-05 | Discharge: 2024-07-05 | Disposition: A | Discharge status: Home / Self Care | Source: Ambulatory Visit | Attending: Nurse Practitioner | Admitting: Nurse Practitioner

## 2024-07-05 ENCOUNTER — Other Ambulatory Visit (HOSPITAL_COMMUNITY): Payer: Self-pay | Admitting: Nurse Practitioner

## 2024-07-05 DIAGNOSIS — L03116 Cellulitis of left lower limb: Secondary | ICD-10-CM | POA: Diagnosis not present

## 2024-07-05 DIAGNOSIS — R600 Localized edema: Secondary | ICD-10-CM | POA: Diagnosis not present

## 2024-07-05 DIAGNOSIS — I350 Nonrheumatic aortic (valve) stenosis: Secondary | ICD-10-CM | POA: Diagnosis not present

## 2024-07-05 DIAGNOSIS — I5022 Chronic systolic (congestive) heart failure: Secondary | ICD-10-CM | POA: Diagnosis not present

## 2024-07-05 DIAGNOSIS — Z952 Presence of prosthetic heart valve: Secondary | ICD-10-CM | POA: Diagnosis not present

## 2024-07-05 DIAGNOSIS — I110 Hypertensive heart disease with heart failure: Secondary | ICD-10-CM | POA: Diagnosis not present

## 2024-07-05 DIAGNOSIS — I872 Venous insufficiency (chronic) (peripheral): Secondary | ICD-10-CM | POA: Diagnosis not present

## 2024-07-05 DIAGNOSIS — I878 Other specified disorders of veins: Secondary | ICD-10-CM | POA: Diagnosis not present

## 2024-07-26 DIAGNOSIS — Z952 Presence of prosthetic heart valve: Secondary | ICD-10-CM | POA: Diagnosis not present

## 2024-07-26 DIAGNOSIS — I110 Hypertensive heart disease with heart failure: Secondary | ICD-10-CM | POA: Diagnosis not present

## 2024-07-26 DIAGNOSIS — R600 Localized edema: Secondary | ICD-10-CM | POA: Diagnosis not present

## 2024-07-26 DIAGNOSIS — I350 Nonrheumatic aortic (valve) stenosis: Secondary | ICD-10-CM | POA: Diagnosis not present

## 2024-07-26 DIAGNOSIS — L03116 Cellulitis of left lower limb: Secondary | ICD-10-CM | POA: Diagnosis not present

## 2024-07-26 DIAGNOSIS — I872 Venous insufficiency (chronic) (peripheral): Secondary | ICD-10-CM | POA: Diagnosis not present

## 2024-07-26 DIAGNOSIS — I878 Other specified disorders of veins: Secondary | ICD-10-CM | POA: Diagnosis not present

## 2024-07-26 DIAGNOSIS — I5022 Chronic systolic (congestive) heart failure: Secondary | ICD-10-CM | POA: Diagnosis not present

## 2024-08-01 DIAGNOSIS — L97529 Non-pressure chronic ulcer of other part of left foot with unspecified severity: Secondary | ICD-10-CM | POA: Diagnosis not present

## 2024-08-02 ENCOUNTER — Ambulatory Visit: Admitting: Podiatry

## 2024-08-02 ENCOUNTER — Ambulatory Visit (HOSPITAL_COMMUNITY)
Admission: RE | Admit: 2024-08-02 | Discharge: 2024-08-02 | Disposition: A | Discharge status: Home / Self Care | Source: Ambulatory Visit | Attending: Internal Medicine | Admitting: Internal Medicine

## 2024-08-02 ENCOUNTER — Encounter (HOSPITAL_COMMUNITY): Payer: Self-pay

## 2024-08-02 ENCOUNTER — Telehealth: Payer: Self-pay | Admitting: Internal Medicine

## 2024-08-02 ENCOUNTER — Other Ambulatory Visit (HOSPITAL_COMMUNITY): Payer: Self-pay | Admitting: Internal Medicine

## 2024-08-02 ENCOUNTER — Encounter (HOSPITAL_COMMUNITY): Payer: Self-pay | Admitting: Internal Medicine

## 2024-08-02 ENCOUNTER — Inpatient Hospital Stay (HOSPITAL_COMMUNITY)
Admission: RE | Admit: 2024-08-02 | Discharge: 2024-08-08 | DRG: 617 | Disposition: A | Discharge status: Home / Self Care | Attending: Internal Medicine | Admitting: Internal Medicine

## 2024-08-02 VITALS — Ht 70.0 in | Wt 160.0 lb

## 2024-08-02 DIAGNOSIS — L03032 Cellulitis of left toe: Secondary | ICD-10-CM | POA: Diagnosis present

## 2024-08-02 DIAGNOSIS — Z888 Allergy status to other drugs, medicaments and biological substances status: Secondary | ICD-10-CM | POA: Diagnosis not present

## 2024-08-02 DIAGNOSIS — E1140 Type 2 diabetes mellitus with diabetic neuropathy, unspecified: Secondary | ICD-10-CM | POA: Diagnosis present

## 2024-08-02 DIAGNOSIS — Z952 Presence of prosthetic heart valve: Secondary | ICD-10-CM

## 2024-08-02 DIAGNOSIS — E1169 Type 2 diabetes mellitus with other specified complication: Secondary | ICD-10-CM | POA: Diagnosis not present

## 2024-08-02 DIAGNOSIS — Z79890 Hormone replacement therapy (postmenopausal): Secondary | ICD-10-CM

## 2024-08-02 DIAGNOSIS — Z9889 Other specified postprocedural states: Secondary | ICD-10-CM

## 2024-08-02 DIAGNOSIS — E039 Hypothyroidism, unspecified: Secondary | ICD-10-CM | POA: Diagnosis present

## 2024-08-02 DIAGNOSIS — I10 Essential (primary) hypertension: Secondary | ICD-10-CM | POA: Diagnosis present

## 2024-08-02 DIAGNOSIS — I5022 Chronic systolic (congestive) heart failure: Secondary | ICD-10-CM | POA: Diagnosis present

## 2024-08-02 DIAGNOSIS — Z89422 Acquired absence of other left toe(s): Secondary | ICD-10-CM | POA: Diagnosis not present

## 2024-08-02 DIAGNOSIS — Z79899 Other long term (current) drug therapy: Secondary | ICD-10-CM

## 2024-08-02 DIAGNOSIS — M86172 Other acute osteomyelitis, left ankle and foot: Secondary | ICD-10-CM | POA: Diagnosis present

## 2024-08-02 DIAGNOSIS — L03116 Cellulitis of left lower limb: Secondary | ICD-10-CM

## 2024-08-02 DIAGNOSIS — I2510 Atherosclerotic heart disease of native coronary artery without angina pectoris: Secondary | ICD-10-CM | POA: Diagnosis not present

## 2024-08-02 DIAGNOSIS — E11621 Type 2 diabetes mellitus with foot ulcer: Secondary | ICD-10-CM | POA: Diagnosis present

## 2024-08-02 DIAGNOSIS — I70201 Unspecified atherosclerosis of native arteries of extremities, right leg: Secondary | ICD-10-CM | POA: Diagnosis not present

## 2024-08-02 DIAGNOSIS — L97529 Non-pressure chronic ulcer of other part of left foot with unspecified severity: Secondary | ICD-10-CM | POA: Insufficient documentation

## 2024-08-02 DIAGNOSIS — E785 Hyperlipidemia, unspecified: Secondary | ICD-10-CM | POA: Diagnosis present

## 2024-08-02 DIAGNOSIS — Z85820 Personal history of malignant melanoma of skin: Secondary | ICD-10-CM

## 2024-08-02 DIAGNOSIS — M19072 Primary osteoarthritis, left ankle and foot: Secondary | ICD-10-CM | POA: Diagnosis not present

## 2024-08-02 DIAGNOSIS — Z8249 Family history of ischemic heart disease and other diseases of the circulatory system: Secondary | ICD-10-CM

## 2024-08-02 DIAGNOSIS — E1151 Type 2 diabetes mellitus with diabetic peripheral angiopathy without gangrene: Secondary | ICD-10-CM | POA: Diagnosis present

## 2024-08-02 DIAGNOSIS — Z8619 Personal history of other infectious and parasitic diseases: Secondary | ICD-10-CM

## 2024-08-02 DIAGNOSIS — R600 Localized edema: Secondary | ICD-10-CM | POA: Diagnosis not present

## 2024-08-02 DIAGNOSIS — M869 Osteomyelitis, unspecified: Secondary | ICD-10-CM | POA: Diagnosis not present

## 2024-08-02 DIAGNOSIS — I5021 Acute systolic (congestive) heart failure: Secondary | ICD-10-CM | POA: Diagnosis not present

## 2024-08-02 DIAGNOSIS — I110 Hypertensive heart disease with heart failure: Secondary | ICD-10-CM | POA: Diagnosis present

## 2024-08-02 DIAGNOSIS — Z7982 Long term (current) use of aspirin: Secondary | ICD-10-CM | POA: Diagnosis not present

## 2024-08-02 DIAGNOSIS — L089 Local infection of the skin and subcutaneous tissue, unspecified: Secondary | ICD-10-CM | POA: Diagnosis not present

## 2024-08-02 DIAGNOSIS — I70245 Atherosclerosis of native arteries of left leg with ulceration of other part of foot: Secondary | ICD-10-CM | POA: Diagnosis present

## 2024-08-02 DIAGNOSIS — Z8546 Personal history of malignant neoplasm of prostate: Secondary | ICD-10-CM | POA: Diagnosis not present

## 2024-08-02 DIAGNOSIS — E038 Other specified hypothyroidism: Secondary | ICD-10-CM

## 2024-08-02 DIAGNOSIS — E7849 Other hyperlipidemia: Secondary | ICD-10-CM | POA: Diagnosis not present

## 2024-08-02 DIAGNOSIS — M86672 Other chronic osteomyelitis, left ankle and foot: Secondary | ICD-10-CM | POA: Diagnosis not present

## 2024-08-02 DIAGNOSIS — I5020 Unspecified systolic (congestive) heart failure: Secondary | ICD-10-CM | POA: Insufficient documentation

## 2024-08-02 LAB — COMPREHENSIVE METABOLIC PANEL WITH GFR
ALT: 14 U/L (ref 0–44)
AST: 17 U/L (ref 15–41)
Albumin: 3.4 g/dL — ABNORMAL LOW (ref 3.5–5.0)
Alkaline Phosphatase: 72 U/L (ref 38–126)
Anion gap: 12 (ref 5–15)
BUN: 13 mg/dL (ref 8–23)
CO2: 24 mmol/L (ref 22–32)
Calcium: 8.7 mg/dL — ABNORMAL LOW (ref 8.9–10.3)
Chloride: 99 mmol/L (ref 98–111)
Creatinine, Ser: 0.91 mg/dL (ref 0.61–1.24)
GFR, Estimated: >60 mL/min (ref >60)
Glucose, Bld: 231 mg/dL — ABNORMAL HIGH (ref 70–99)
Potassium: 3.6 mmol/L (ref 3.5–5.1)
Sodium: 135 mmol/L (ref 135–145)
Total Bilirubin: 0.6 mg/dL (ref 0.0–1.2)
Total Protein: 6.2 g/dL — ABNORMAL LOW (ref 6.5–8.1)

## 2024-08-02 LAB — CBC
HCT: 33.7 % — ABNORMAL LOW (ref 39.0–52.0)
Hemoglobin: 11.1 g/dL — ABNORMAL LOW (ref 13.0–17.0)
MCH: 27.5 pg (ref 26.0–34.0)
MCHC: 32.9 g/dL (ref 30.0–36.0)
MCV: 83.4 fL (ref 80.0–100.0)
Platelets: 187 K/uL (ref 150–400)
RBC: 4.04 MIL/uL — ABNORMAL LOW (ref 4.22–5.81)
RDW: 15.4 % (ref 11.5–15.5)
WBC: 4.8 K/uL (ref 4.0–10.5)
nRBC: 0 % (ref 0.0–0.2)

## 2024-08-02 MED ORDER — AMLODIPINE BESYLATE 10 MG PO TABS
10.0000 mg | ORAL_TABLET | Freq: Every day | ORAL | Status: DC
  Administered 2024-08-03 – 2024-08-08 (×5): 10 mg via ORAL
  Filled 2024-08-02 (×2): qty 1
  Filled 2024-08-02: qty 2
  Filled 2024-08-02 (×2): qty 1

## 2024-08-02 MED ORDER — LEVOTHYROXINE SODIUM 50 MCG PO TABS
50.0000 ug | ORAL_TABLET | Freq: Every day | ORAL | Status: DC
  Administered 2024-08-03 – 2024-08-08 (×5): 50 ug via ORAL
  Filled 2024-08-02 (×7): qty 1

## 2024-08-02 MED ORDER — DOCUSATE SODIUM 100 MG PO CAPS
100.0000 mg | ORAL_CAPSULE | Freq: Two times a day (BID) | ORAL | Status: DC
  Administered 2024-08-02 – 2024-08-07 (×9): 100 mg via ORAL
  Filled 2024-08-02 (×11): qty 1

## 2024-08-02 MED ORDER — SODIUM CHLORIDE 0.9% FLUSH
3.0000 mL | Freq: Two times a day (BID) | INTRAVENOUS | Status: DC
  Administered 2024-08-02 – 2024-08-08 (×9): 3 mL via INTRAVENOUS

## 2024-08-02 MED ORDER — SODIUM CHLORIDE 0.9 % IV SOLN
2.0000 g | Freq: Two times a day (BID) | INTRAVENOUS | Status: DC
  Administered 2024-08-02 – 2024-08-08 (×12): 2 g via INTRAVENOUS
  Filled 2024-08-02 (×12): qty 12.5

## 2024-08-02 MED ORDER — LOSARTAN POTASSIUM 25 MG PO TABS
25.0000 mg | ORAL_TABLET | Freq: Every day | ORAL | Status: DC
  Administered 2024-08-03 – 2024-08-07 (×4): 25 mg via ORAL
  Filled 2024-08-02 (×5): qty 1

## 2024-08-02 MED ORDER — ONDANSETRON HCL 4 MG PO TABS: 4.0000 mg | ORAL_TABLET | Freq: Four times a day (QID) | ORAL | Status: DC | PRN

## 2024-08-02 MED ORDER — VANCOMYCIN HCL 1250 MG/250ML IV SOLN
1250.0000 mg | INTRAVENOUS | Status: DC
  Administered 2024-08-03 – 2024-08-06 (×4): 1250 mg via INTRAVENOUS
  Filled 2024-08-02 (×5): qty 250

## 2024-08-02 MED ORDER — VANCOMYCIN HCL 1250 MG/250ML IV SOLN
1250.0000 mg | Freq: Once | INTRAVENOUS | Status: AC
  Administered 2024-08-02: 1250 mg via INTRAVENOUS
  Filled 2024-08-02: qty 250

## 2024-08-02 MED ORDER — ASPIRIN 81 MG PO TBEC
81.0000 mg | DELAYED_RELEASE_TABLET | Freq: Every day | ORAL | Status: DC
  Administered 2024-08-03 – 2024-08-08 (×5): 81 mg via ORAL
  Filled 2024-08-02 (×5): qty 1

## 2024-08-02 MED ORDER — SODIUM CHLORIDE 0.9% FLUSH: 3.0000 mL | INTRAVENOUS | Status: DC | PRN

## 2024-08-02 MED ORDER — ACETAMINOPHEN 650 MG RE SUPP: 650.0000 mg | Freq: Four times a day (QID) | RECTAL | Status: DC | PRN

## 2024-08-02 MED ORDER — HEPARIN SODIUM (PORCINE) 5000 UNIT/ML IJ SOLN
5000.0000 [IU] | Freq: Three times a day (TID) | INTRAMUSCULAR | Status: DC
  Administered 2024-08-02 – 2024-08-05 (×10): 5000 [IU] via SUBCUTANEOUS
  Filled 2024-08-02 (×11): qty 1

## 2024-08-02 MED ORDER — ONDANSETRON HCL 4 MG/2ML IJ SOLN: 4.0000 mg | Freq: Four times a day (QID) | INTRAMUSCULAR | Status: DC | PRN

## 2024-08-02 MED ORDER — ACETAMINOPHEN 325 MG PO TABS
650.0000 mg | ORAL_TABLET | Freq: Four times a day (QID) | ORAL | Status: DC | PRN
  Administered 2024-08-04: 650 mg via ORAL
  Filled 2024-08-02: qty 2

## 2024-08-02 MED ORDER — SODIUM CHLORIDE 0.9% FLUSH
3.0000 mL | Freq: Two times a day (BID) | INTRAVENOUS | Status: DC
  Administered 2024-08-03 – 2024-08-08 (×7): 3 mL via INTRAVENOUS

## 2024-08-02 MED ORDER — SODIUM CHLORIDE 0.9 % IV SOLN: 250.0000 mL | INTRAVENOUS | Status: AC | PRN

## 2024-08-02 NOTE — Progress Notes (Signed)
 Pharmacy Antibiotic Note  Omar Haley is a 89 y.o. male admitted on 08/02/2024 with osteomyelitis .  Pharmacy has been consulted for Vancomycin  / Cefepime dosing.  Plan: Vancomycin 1250 mg iv Q 24 hours (AUC of 512 using Scr 1) Cefepime 2 grams iv Q 12 hours  Follow Scr, LOT, cultures.     Temp (24hrs), Avg:98.8 F (37.1 C), Min:98.8 F (37.1 C), Max:98.8 F (37.1 C)  No results for input(s): WBC, CREATININE, LATICACIDVEN, VANCOTROUGH, VANCOPEAK, VANCORANDOM, GENTTROUGH, GENTPEAK, GENTRANDOM, TOBRATROUGH, TOBRAPEAK, TOBRARND, AMIKACINPEAK, AMIKACINTROU, AMIKACIN in the last 168 hours.  CrCl cannot be calculated (Patient's most recent lab result is older than the maximum 21 days allowed.).    Allergies  Allergen Reactions   Enalapril Cough   Gemfibrozil Other (See Comments)    Unknown   Micardis [Telmisartan] Diarrhea   Spironolactone  Itching    Scalp itching   Zetia [Ezetimibe]     laryngitis   Coreg  [Carvedilol ] Rash   Metformin Hcl Rash     Thank you. Olam Monte, PharmD 08/02/2024 8:09 PM

## 2024-08-02 NOTE — Progress Notes (Signed)
  Subjective:  Patient ID: Omar Haley, male    DOB: 1931-04-04,  MRN: [id number]  Chief Complaint  Patient presents with   Wound Check    RM 10 *urgent referral* new patient-ulcer left foot;L 3rd digit, toenail trauma-Scott Holwerda, MD refer. Pt states no injury ot he third left. Pt states swelling in 3rd left toe and left leg. Pt states swelling in toe began 2 months ago. Pt is currently on doxycycline.    Discussed the use of AI scribe software for clinical note transcription with the patient, who gave verbal consent to proceed.  History of Present Illness Omar Haley is a 89 year old male who presents with a persistent infection in the left third toe.  He has been experiencing issues with his left third toe for the past couple of months. The condition has been intermittent, with periods of apparent healing followed by worsening. He noticed the nail detaching and soreness in the toe, especially when pressure is applied, with variable redness. An MRI was conducted earlier today.  He is currently on doxycycline, having completed a course a couple of weeks ago and restarted it yesterday after a brief hiatus.  He has a history of borderline diabetes managed through diet and underwent heart surgery in May of the previous year, where an artificial valve was placed. He is not on blood thinners and reports no restrictions or issues with his heart since the procedure.  No diabetes requiring medication. He reports swelling in the leg, though he is unsure if it is related to the toe issue.      Objective:    Physical Exam VASCULAR: DP and PT pulse nonpalpable d/t edema. Foot is warm and well-perfused.   DERMATOLOGIC: Normal skin turgor, texture, and temperature. No open lesions or rashes. Full thickness ulceration on distal tip of left third toe. NEUROLOGIC: Abnormal sensation to light touch and pressure.Peripheral neuropathy ORTHOPEDIC: No pain to palpation. Erythema to metatarsal  phalangeal joint with significant swelling of left lower extremity. Negative Hohmann and Pratt sign, negative DVT signs.         Results RADIOLOGY Left third toe MRI: Osteomyelitis of the distal phalanx 3rd toe (08/02/2024)   Assessment:   1. Acute osteomyelitis of left foot (HCC)   2. Cellulitis of left leg      Plan:  Patient was evaluated and treated and all questions answered.  Assessment and Plan Assessment & Plan Osteomyelitis and chronic ulcer of distal phalanx, left third toe with persistent cellulitis Chronic ulceration and osteomyelitis of the distal phalanx of the left third toe with persistent cellulitis. The infection is resistant to oral antibiotics, as evidenced by ongoing erythema and swelling despite doxycycline treatment. MRI confirms osteomyelitis of the distal phalanx. The condition has been present for a couple of months, with intermittent improvement and worsening. The risk of sepsis and potential bacterial endocarditis due to the presence of TAVR valve necessitates urgent intervention. - Admitted to hospital for evaluation and management. Signout to [employer] for DA given - Initiated IV broad-spectrum antibiotic therapy. - Ordered noninvasive vascular testing with ABIs. - Consulted vascular surgery if vascular testing is abnormal. - Planned for partial toe amputation if vascular testing is satisfactory. Our team is aware and will follow as consulting service  History of prosthetic heart valve Prosthetic heart valve placed in May 2024. No current anticoagulation therapy. Risk of bacterial endocarditis due to potential bloodstream infection from toe osteomyelitis.      No follow-ups on file.

## 2024-08-02 NOTE — H&P (Signed)
 History and Physical    Omar Haley FMW:[id number] DOB: 01-03-1931 DOA: 08/02/2024  PCP: Larnell Hamilton, MD   Patient coming from: Clinic   Chief Complaint: Left foot osteomyelitis  HPI:  Omar Haley is a 89 y.o. male with medical history significant of  HTN, aortic stenosis, HFrEF, prostate cancer, HLD, TAVR, lumbar laminectomy and hypothyroidism present to the hospital with complaints of longstanding swelling of his left foot with infection. Received a call from podiatry Dr. Juliene Medicine about this patient.  Seen in the clinic on 11/20.  Referred to him for an MRI that was ordered by PCP of his left foot which is positive for osteomyelitis of the distal third phalanx and cellulitis which appears to be progressively worsening. Per Dr. Medicine patient has been on multiple rounds of oral doxycycline for his cellulitis but shows no significant improvement and has progressive worsening. - Lower extremity Doppler of the same foot in 07/05/2024 which was negative for DVT. Patient is reportedly currently hemodynamically stable for admission to MedSurg unit. Echocardiogram 5/25 shows EF of 55% Will require blood cultures and IV antibiotics given history of prosthetic valve. Will also require vascular workup to evaluate for circulation. At present patient accepted for MedSurg unit inpatient for left foot osteomyelitis.  At presentation to hospital patient is hemodynamically stable.  Afebrile.    Significant labs in the ED: Lab Orders         Culture, blood (Routine X 2) w Reflex to ID Panel         CBC         Comprehensive metabolic panel         CBC         Basic metabolic panel        Review of Systems:  Review of Systems  Constitutional:  Negative for chills, fever and weight loss.  Respiratory:  Negative for cough, shortness of breath and wheezing.   Cardiovascular:  Negative for chest pain and leg swelling.  Gastrointestinal:  Negative for abdominal pain, heartburn,  nausea and vomiting.  Genitourinary:  Negative for dysuria.  Musculoskeletal:  Negative for falls, joint pain and myalgias.  Neurological:  Negative for dizziness.  Psychiatric/Behavioral:  The patient is not nervous/anxious.     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 Wendel & Dr. Lucas   Unifocal PVCs    Vitamin D deficiency     Past Surgical History:  Procedure Laterality Date   APPENDECTOMY     COLON SURGERY  09/13/1989   rt hemicolectomy   HERNIA REPAIR     INTRAOPERATIVE TRANSTHORACIC ECHOCARDIOGRAM N/A 01/18/2023   Procedure: INTRAOPERATIVE TRANSTHORACIC ECHOCARDIOGRAM;  Surgeon: Wendel Lurena POUR, MD;  Location: [facility];  Service: Open Heart Surgery;  Laterality: N/A;   LUMBAR LAMINECTOMY/DECOMPRESSION MICRODISCECTOMY Left 11/21/2023  Procedure: Left Lumbar Four-Five Microdiscectomy;  Surgeon: Joshua Alm Hamilton, MD;  Location: [facility] OR;  Service: Neurosurgery;  Laterality: Left;   MELANOMA EXCISION  11/2005&04/2011   skin   RIGHT/LEFT HEART CATH AND CORONARY ANGIOGRAPHY N/A 01/06/2023   Procedure: RIGHT/LEFT HEART CATH AND CORONARY ANGIOGRAPHY;  Surgeon: Wendel Lurena POUR, MD;  Location: [facility];  Service: Cardiovascular;  Laterality: N/A;   TRANSCATHETER AORTIC VALVE REPLACEMENT, TRANSFEMORAL N/A 01/18/2023   Procedure: Transcatheter Aortic Valve Replacement, Transfemoral;  Surgeon:  Thukkani, Arun K, MD;  Location: [facility];  Service: Open Heart Surgery;  Laterality: N/A;     reports that he has never smoked. He has never used smokeless tobacco. He reports that he does not drink alcohol  and does not use drugs.  Allergies  Allergen Reactions   Enalapril Cough   Gemfibrozil Other (See Comments)    Unknown   Micardis [Telmisartan] Diarrhea   Spironolactone  Itching    Scalp itching   Zetia [Ezetimibe]     laryngitis   Coreg  [Carvedilol ] Rash   Metformin Hcl Rash    Family History  Problem Relation Age of Onset   Heart failure Mother    Cerebral aneurysm Father     Prior to Admission medications   Medication Sig Start Date End Date Taking? Authorizing Provider  acetaminophen  (TYLENOL ) 650 MG CR tablet Take 650 mg by mouth every 8 (eight) hours as needed for pain.    [provider]  amLODipine  (NORVASC ) 10 MG tablet TAKE 1 TABLET(10 MG) BY MOUTH DAILY 03/07/24   Omar Lamarr SAUNDERS, PA-C  amoxicillin  (AMOXIL ) 500 MG tablet Take 4 tablets by mouth 1 hour prior to dental procedures and cleanings. Patient not taking: Reported on 08/02/2024 01/26/23   Omar Lamarr SAUNDERS, PA-C  Aspirin  81 MG EC tablet Take 81 mg by mouth daily.    [provider]  doxycycline (VIBRAMYCIN) 100 MG capsule 1 capsule. 07/05/24   [provider]  HYDROcodone -acetaminophen  (NORCO/VICODIN) 5-325 MG tablet Take 1 tablet by mouth every 4 (four) hours as needed for severe pain (pain score 7-10). Patient not taking: Reported on 08/02/2024 11/21/23   Joshua Alm Hamilton, MD  latanoprost  (XALATAN ) 0.005 % ophthalmic solution Place 1 drop into both eyes at bedtime.    [provider]  levothyroxine  (SYNTHROID ) 50 MCG tablet Take 50 mcg by mouth daily before breakfast. 04/05/18   [provider]  losartan  (COZAAR ) 25 MG tablet TAKE 1 TABLET(25 MG) BY MOUTH AT BEDTIME 06/05/24   Thukkani, Arun K, MD  triamcinolone cream (KENALOG) 0.1 % 1 Application.  07/05/24   [provider]     Physical Exam: Vitals:   08/02/24 1940 08/02/24 2011 08/02/24 2324  BP: (!) 159/56  (!) 165/56  Pulse: 61  63  Resp: 15  17  Temp: 98.8 F (37.1 C)  98.8 F (37.1 C)  TempSrc: Oral  Oral  SpO2: 95% 95% 94%    Physical Exam Vitals and nursing note reviewed.  Constitutional:      General: He is not in acute distress.    Appearance: He is not ill-appearing.  Eyes:     Pupils: Pupils are equal, round, and reactive to light.  Cardiovascular:     Rate and Rhythm: Normal rate and regular rhythm.     Pulses: Normal pulses.     Heart sounds: Normal heart sounds.  Pulmonary:     Effort: Pulmonary effort is normal.     Breath sounds: Normal breath sounds.  Musculoskeletal:        General: Tenderness present. No swelling.     Cervical back: Neck supple.  Skin:    Capillary Refill: Capillary refill takes less than 2 seconds.  Neurological:     Mental Status: He is alert and oriented to person, place, and time.  Psychiatric:        Mood and Affect: Mood normal.      Labs on Admission: I have personally reviewed following labs and imaging studies  CBC: Recent Labs  Lab 08/02/24 2036  WBC 4.8  HGB 11.1*  HCT 33.7*  MCV 83.4  PLT 187   Basic Metabolic Panel: Recent Labs  Lab 08/02/24 2036  NA 135  K 3.6  CL 99  CO2 24  GLUCOSE 231*  BUN 13  CREATININE 0.91  CALCIUM  8.7*   GFR: Estimated Creatinine Clearance: 55.4 mL/min (by C-G formula based on SCr of 0.91 mg/dL). Liver Function Tests: Recent Labs  Lab 08/02/24 2036  AST 17  ALT 14  ALKPHOS 72  BILITOT 0.6  PROT 6.2*  ALBUMIN 3.4*   No results for input(s): LIPASE, AMYLASE in the last 168 hours. No results for input(s): AMMONIA in the last 168 hours. Coagulation Profile: No results for input(s): INR, PROTIME in the last 168 hours. Cardiac Enzymes: No results for input(s): CKTOTAL, CKMB, CKMBINDEX, TROPONINI, TROPONINIHS in the last 168  hours. BNP (last 3 results) No results for input(s): BNP in the last 8760 hours. HbA1C: No results for input(s): HGBA1C in the last 72 hours. CBG: No results for input(s): GLUCAP in the last 168 hours. Lipid Profile: No results for input(s): CHOL, HDL, LDLCALC, TRIG, CHOLHDL, LDLDIRECT in the last 72 hours. Thyroid  Function Tests: No results for input(s): TSH, T4TOTAL, FREET4, T3FREE, THYROIDAB in the last 72 hours. Anemia Panel: No results for input(s): VITAMINB12, FOLATE, FERRITIN, TIBC, IRON, RETICCTPCT in the last 72 hours. Urine analysis:    Component Value Date/Time   COLORURINE STRAW (A) 01/14/2023 0845   APPEARANCEUR CLEAR 01/14/2023 0845   LABSPEC 1.009 01/14/2023 0845   PHURINE 6.0 01/14/2023 0845   GLUCOSEU 50 (A) 01/14/2023 0845   HGBUR NEGATIVE 01/14/2023 0845   BILIRUBINUR NEGATIVE 01/14/2023 0845   KETONESUR NEGATIVE 01/14/2023 0845   PROTEINUR NEGATIVE 01/14/2023 0845   UROBILINOGEN 0.2 04/28/2011 1634   NITRITE NEGATIVE 01/14/2023 0845   LEUKOCYTESUR NEGATIVE 01/14/2023 0845    Radiological Exams on Admission: I have personally reviewed images MR FOOT LEFT WO CONTRAST Result Date: 08/02/2024 CLINICAL DATA:  Sore on the third toe. EXAM: MRI OF THE LEFT FOOT WITHOUT CONTRAST TECHNIQUE: Multiplanar, multisequence MR imaging of the left forefoot was performed. No intravenous contrast was administered. COMPARISON:  None Available. FINDINGS: Bones/Joint/Cartilage Marrow edema and confluent T1 hypointensity of the third distal phalanx, compatible with osteomyelitis. No marrow signal abnormality identified elsewhere to suggest osteomyelitis. No fracture or dislocation. No joint effusion. Mild degenerative changes of the first MTP joint with joint space narrowing and osteophytosis. Ligaments Collateral ligaments are intact. Muscles and Tendons No significant tenosynovitis. Mild fatty infiltration and increased T2 signal of the intrinsic  foot musculature, may reflect chronic denervation change. Soft tissue Cutaneous irregularity and thickening of the distal third toe. No loculated fluid collection. Subcutaneous edema extending along the dorsal forefoot. IMPRESSION: 1. Findings compatible with osteomyelitis of the third distal phalanx. Overlying cutaneous irregularity and thickening. No loculated fluid collection. 2. Mild osteoarthritis of the first MTP joint. 3. Subcutaneous edema extending along the dorsal forefoot. Electronically Signed  By: Harrietta Sherry M.D.   On: 08/02/2024 11:18      Assessment/Plan: Principal Problem:   Acute osteomyelitis of left foot (HCC) Active Problems:   Essential hypertension   Hypothyroidism   Hyperlipidemia   History of transcatheter aortic valve replacement (TAVR)   History of lumbar laminectomy   HFrEF (heart failure with reduced ejection fraction) (HCC)   History of prostate cancer    Assessment and Plan: Acute osteomyelitis of the left foot -Patient is presented to hospital direct admit from prior footcare for management of left foot osteomyelitis. -MRI of the left foot showing osteomyelitis of the third distal  phalanx. Overlying cutaneous irregularity and thickening. No  loculated fluid collection.  -Patient failed outpatient treatment with doxycycline multiple courses. -Referred to hospital by Dr. Vivi recommended to obtain ABI index of the lower extremities, blood cultures and initiate IV antibiotic given patient has a prosthetic heart valve. - Obtaining blood cultures, checking CBC and CMP.  Pending ABI index. - Initiating IV vancomycin  and cefepime  with pharmacy consult - Need to follow-up with blood culture result. - Patient is hemodynamically stable.  Informed on-call podiatry Dr. Malvin will evaluate patient tomorrow.  Essential hypertension -Continue amlodipine  and losartan .  Hypothyroidism -Continue levothyroxine    History of TAVR -Patient is on  amoxicillin  for chronic prophylaxis.  Currently holding amoxicillin  given treating with vancomycin  and cefepime .  HFrEF Hypertension - Continue amlodipine  and losartan .   History of lumbar laminectomy   DVT prophylaxis:  SQ Heparin  Code Status:  Full Code Diet: Heart healthy diet. Family Communication:   Family was present at bedside, at the time of interview. Opportunity was given to ask question and all questions were answered satisfactorily.  Disposition Plan: Need to follow-up with blood culture result and ABI index. Consults: Podiatry. Admission status:   Inpatient, Med-Surg  Severity of Illness: The appropriate patient status for this patient is INPATIENT. Inpatient status is judged to be reasonable and necessary in order to provide the required intensity of service to ensure the patient's safety. The patient's presenting symptoms, physical exam findings, and initial radiographic and laboratory data in the context of their chronic comorbidities is felt to place them at high risk for further clinical deterioration. Furthermore, it is not anticipated that the patient will be medically stable for discharge from the hospital within 2 midnights of admission.   * I certify that at the point of admission it is my clinical judgment that the patient will require inpatient hospital care spanning beyond 2 midnights from the point of admission due to high intensity of service, high risk for further deterioration and high frequency of surveillance required.DEWAINE    Myrikal Messmer, MD [facility]  How to contact the [facility] Attending or Consulting provider 7A - 7P or covering provider during after hours 7P -7A, for this patient.  Check the care team in [facility] and look for a) attending/consulting [facility] provider listed and b) the [facility] team listed Log into [email or web address] and use [place]'s universal password to access. If you do not have the password, please contact the hospital operator. Locate the  [facility] provider you are looking for under [facility] and page to a number that you can be directly reached. If you still have difficulty reaching the provider, please page the [facility] (Director on Call) for the Hospitalists listed on amion for assistance.  08/03/2024, 2:30 AM

## 2024-08-02 NOTE — Telephone Encounter (Signed)
 [facility] TELEPHONE ENCOUNTER NOTE  Patient: Omar Haley FMW:[id number]   PCP: Larnell Hamilton, MD DOB: 09/17/1930   DOS: 08/02/2024     Patient with PMH of HTN, aortic stenosis, HFrEF, prostate cancer, HLD, TAVR, lumbar laminectomy present to the hospital with complaints of longstanding swelling of his left foot with infection. Received a call from podiatry Dr. Juliene Medicine about this patient.  Seen in the clinic on 11/20.  Referred to him for an MRI that was ordered by PCP of his left foot which is positive for osteomyelitis of the distal third phalanx and cellulitis which appears to be progressively worsening. Per Dr. Medicine patient has been on multiple rounds of oral doxycycline for his cellulitis but shows no significant improvement and has progressive worsening. - Lower extremity Doppler of the same foot in 07/05/2024 which was negative for DVT. Patient is reportedly currently hemodynamically stable for admission to MedSurg unit. Echocardiogram 5/25 shows EF of 55% Will require blood cultures and IV antibiotics given history of prosthetic valve. Will also require vascular workup to evaluate for circulation. At present patient accepted for MedSurg unit inpatient for left foot osteomyelitis.  Author: Yetta Blanch, MD [facility] 08/02/2024

## 2024-08-03 ENCOUNTER — Inpatient Hospital Stay (HOSPITAL_COMMUNITY)

## 2024-08-03 ENCOUNTER — Other Ambulatory Visit: Payer: Self-pay

## 2024-08-03 DIAGNOSIS — M86172 Other acute osteomyelitis, left ankle and foot: Secondary | ICD-10-CM | POA: Diagnosis not present

## 2024-08-03 DIAGNOSIS — M869 Osteomyelitis, unspecified: Secondary | ICD-10-CM

## 2024-08-03 DIAGNOSIS — L97529 Non-pressure chronic ulcer of other part of left foot with unspecified severity: Secondary | ICD-10-CM | POA: Diagnosis not present

## 2024-08-03 LAB — BASIC METABOLIC PANEL WITH GFR
Anion gap: 14 (ref 5–15)
BUN: 11 mg/dL (ref 8–23)
CO2: 23 mmol/L (ref 22–32)
Calcium: 8.5 mg/dL — ABNORMAL LOW (ref 8.9–10.3)
Chloride: 100 mmol/L (ref 98–111)
Creatinine, Ser: 0.78 mg/dL (ref 0.61–1.24)
GFR, Estimated: >60 mL/min (ref >60)
Glucose, Bld: 155 mg/dL — ABNORMAL HIGH (ref 70–99)
Potassium: 3.5 mmol/L (ref 3.5–5.1)
Sodium: 137 mmol/L (ref 135–145)

## 2024-08-03 LAB — CBC
HCT: 35 % — ABNORMAL LOW (ref 39.0–52.0)
Hemoglobin: 11.4 g/dL — ABNORMAL LOW (ref 13.0–17.0)
MCH: 27.2 pg (ref 26.0–34.0)
MCHC: 32.6 g/dL (ref 30.0–36.0)
MCV: 83.5 fL (ref 80.0–100.0)
Platelets: 178 K/uL (ref 150–400)
RBC: 4.19 MIL/uL — ABNORMAL LOW (ref 4.22–5.81)
RDW: 15.3 % (ref 11.5–15.5)
WBC: 4.6 K/uL (ref 4.0–10.5)
nRBC: 0 % (ref 0.0–0.2)

## 2024-08-03 NOTE — Consult Note (Signed)
 PODIATRY CONSULTATION  NAME Omar Haley MRN [id number] DOB 02/04/1931 DOA 08/02/2024   Reason for consult: Toe ulcer left foot  Attending/Consulting physician: MYRTIS Dawley  History of present illness: Omar Haley is a 89 y.o. male with medical history significant of  HTN, aortic stenosis, HFrEF, prostate cancer, HLD, TAVR, lumbar laminectomy and hypothyroidism present to the hospital with complaints of longstanding swelling of his left foot with infection.   Pt direct admitted after apt with Dr. Silva yesterday - concern for OM of L third toe distal phalanx on MRI with cellulitis. Also concern for pvd. Discussed plans for vascular studies and eventual partial amputation of the left third toe pending these results possibly tmrw or Monday.   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 Wendel & Dr. Lucas   Unifocal PVCs    Vitamin D deficiency        Latest Ref Rng & Units 08/03/2024    4:17 AM 08/02/2024    8:36 PM 11/15/2023    9:20 AM  CBC  WBC 4.0 - 10.5 K/uL 4.6  4.8  4.6   Hemoglobin 13.0 - 17.0 g/dL 88.5  88.8  86.7   Hematocrit 39.0 - 52.0 % 35.0  33.7  40.1   Platelets 150 - 400 K/uL 178  187  156        Latest Ref Rng & Units 08/03/2024    4:17 AM 08/02/2024    8:36 PM 11/15/2023    9:20 AM  BMP  Glucose 70 - 99 mg/dL 844  768  784   BUN 8 - 23 mg/dL 11  13  11    Creatinine 0.61 - 1.24 mg/dL  9.21  9.08  9.20   Sodium 135 - 145 mmol/L 137  135  139   Potassium 3.5 - 5.1 mmol/L 3.5  3.6  4.3   Chloride 98 - 111 mmol/L 100  99  101   CO2 22 - 32 mmol/L 23  24  24    Calcium  8.9 - 10.3 mg/dL 8.5  8.7  9.9       Physical Exam: Lower Extremity Exam  DP and PT pulses non palpable left foot  Ulceration distlal plantar aspect of left third toe with erythema and edema distally  Sensation diminished to light touch  Edema of left ankle       ASSESSMENT/PLAN OF CARE 89 y.o. male with PMHx significant for  HTN, aortic stenosis, HFrEF, prostate cancer, HLD, TAVR, lumbar laminectomy and hypothyroidism  with osteomyelitis of left third toe distal phalanx.  - NPO p MN for possible OR tomorrow for partial left third toe amp. This is pending ABI / PVR results as well as on call availability, may also happen Monday. Will discus with Dr. Janit who is covering this weekend.  Pt in  agreement with plan.  - ABI ordered for today will follow up results after completion - Continue IV abx broad spectrum pending further culture data - Anticoagulation: ok to continue per primary - Wound care: none needed at this time - WB status: WBAT in post op shoe following surgery  - Will continue to follow   Thank you for the consult.  Please contact me directly with any questions or concerns.           Marolyn JULIANNA Honour, DPM [facility] / [facility]    [address] [place], [place] [address]                Office [phone]  Fax [phone]

## 2024-08-03 NOTE — Progress Notes (Signed)
 ABI exam has been completed.   Results can be found under chart review under CV PROC. 08/03/2024 4:00 PM Oisin Yoakum RVT, RDMS

## 2024-08-03 NOTE — H&P (View-Only) (Signed)
 Hospital Consult    Reason for Consult: Left third toe osteomyelitis Requesting Physician: [facility], podiatry MRN #:  [id number]  History of Present Illness: This is a 89 y.o. male with past medical history significant for hypertension, hyperlipidemia, diabetes mellitus, CHF.  He is being seen in consultation for evaluation of left third toe osteomyelitis in the presence of PAD.  Workup included ABI/TBI demonstrating a left ABI of 0.6 with a toe pressure of 56 mmHg.  He denies claudication or rest pain.  He states the wound started on the toe tip about 2 to 3 months ago.  He states he wound slowly worsened despite using Neosporin.  He was scheduled for a third toe amputation tomorrow with podiatry.  He has been started on broad-spectrum antibiotics.  He denies any fevers, chills, nausea/vomiting.  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 Wendel & Dr. Lucas   Unifocal PVCs    Vitamin D deficiency     Past Surgical History:  Procedure Laterality Date   APPENDECTOMY     COLON SURGERY  09/13/1989   rt hemicolectomy   HERNIA REPAIR     INTRAOPERATIVE TRANSTHORACIC ECHOCARDIOGRAM N/A 01/18/2023   Procedure: INTRAOPERATIVE TRANSTHORACIC ECHOCARDIOGRAM;  Surgeon: Wendel Lurena POUR, MD;  Location: [facility];  Service: Open Heart Surgery;  Laterality: N/A;   LUMBAR LAMINECTOMY/DECOMPRESSION MICRODISCECTOMY Left 11/21/2023    Procedure: Left Lumbar Four-Five Microdiscectomy;  Surgeon: Albertia Carvin Alm Hamilton, MD;  Location: [facility] OR;  Service: Neurosurgery;  Laterality: Left;   MELANOMA EXCISION  11/2005&04/2011   skin   RIGHT/LEFT HEART CATH AND CORONARY ANGIOGRAPHY N/A 01/06/2023   Procedure: RIGHT/LEFT HEART CATH AND CORONARY ANGIOGRAPHY;  Surgeon: Wendel Lurena POUR, MD;  Location: [facility];  Service: Cardiovascular;  Laterality: N/A;   TRANSCATHETER AORTIC VALVE REPLACEMENT, TRANSFEMORAL N/A 01/18/2023   Procedure: Transcatheter Aortic Valve Replacement, Transfemoral;  Surgeon: Thukkani, Arun K, MD;  Location: [facility];  Service: Open Heart Surgery;  Laterality: N/A;    Allergies  Allergen Reactions   Aldactone  [Spironolactone ] Itching    Scalp itching   Lopid [Gemfibrozil] Other (See Comments)    Unknown reaction   Micardis [Telmisartan] Diarrhea   Vasotec [Enalapril] Cough   Zetia [Ezetimibe] Other (See Comments)    Laryngitis    Coreg  [Carvedilol ] Rash   Glucophage [Metformin] Rash    Prior to Admission medications   Medication Sig Start Date End Date Taking? Authorizing Provider  acetaminophen  (TYLENOL ) 650 MG CR tablet Take 650 mg by mouth every 8 (eight) hours as needed for pain.   Yes [provider]  amLODipine  (NORVASC ) 10 MG tablet TAKE 1 TABLET(10 MG) BY MOUTH DAILY 03/07/24  Yes Sebastian Lamarr SAUNDERS, PA-C  Aspirin  81 MG EC tablet Take 81 mg by mouth daily.   Yes [provider]  doxycycline (VIBRAMYCIN) 100 MG capsule Take 100 mg by mouth 2 (two) times daily. 10 day course. 07/05/24  Yes [provider]  latanoprost  (XALATAN ) 0.005 % ophthalmic solution Place 1 drop into both eyes at bedtime.   Yes [provider]  levothyroxine  (SYNTHROID ) 50 MCG tablet Take 50 mcg by mouth daily before breakfast. 04/05/18  Yes [provider]  losartan  (COZAAR ) 25 MG tablet TAKE 1 TABLET(25 MG) BY MOUTH AT BEDTIME 06/05/24  Yes Thukkani, Arun K, MD   triamcinolone cream (KENALOG) 0.1 % Apply 1 Application topically 2 (two) times daily as needed (skin irritation). 07/05/24  Yes [provider]  amoxicillin  (AMOXIL ) 500 MG tablet Take 4 tablets by mouth 1 hour prior to dental procedures and cleanings. Patient not taking: No sig reported 01/26/23   Sebastian Lamarr SAUNDERS, PA-C    Social History   Socioeconomic History   Marital status: Married    Spouse name: Not on file   Number of children: Not on file   Years of education: Not on file   Highest education level: Not on file  Occupational History   Not on file  Tobacco Use   Smoking status: Never   Smokeless tobacco: Never  Vaping Use   Vaping status: Never Used  Substance and Sexual Activity   Alcohol  use: No   Drug use: No   Sexual activity: Not on file  Other Topics Concern   Not on file  Social History Narrative   Not on file   Social Drivers of Health   Financial Resource Strain: Not on file  Food Insecurity: No Food Insecurity (08/02/2024)   Hunger Vital Sign    Worried About Running Out of Food in the Last Year: Never true    Ran Out of Food in the Last Year: Never true  Transportation Needs: No Transportation Needs (08/02/2024)   PRAPARE - Administrator, Civil Service (Medical): No    Lack of Transportation (Non-Medical): No  Physical Activity: Not on file  Stress: Not on file  Social Connections: Moderately Integrated (08/02/2024)   Social Connection and Isolation Panel    Frequency of Communication with Friends and Family: Twice a week    Frequency of Social Gatherings with Friends and Family: Twice a week    Attends Religious Services: More than 4 times per year    Active Member of [employer] or Organizations: No    Attends Banker Meetings: Never    Marital Status: Married  Catering Manager Violence: Not At Risk (08/02/2024)   Humiliation, Afraid, Rape, and Kick questionnaire    Fear of Current or Ex-Partner: No     Emotionally Abused: No    Physically Abused: No    Sexually Abused: No    Family History  Problem Relation Age of Onset   Heart failure Mother    Cerebral aneurysm Father     ROS: Otherwise negative unless mentioned in HPI  Physical Examination  Vitals:   08/03/24 0805 08/03/24 1445  BP: (!) 169/56 (!) 162/61  Pulse: 64 65  Resp: 18 18  Temp: 98.4 F (36.9 C) 98.4 F (36.9 C)  SpO2: 97% 97%   There is no height or weight on file to calculate BMI.  General:  WDWN in NAD Gait: Not observed HENT: WNL, normocephalic Pulmonary: normal non-labored breathing Cardiac: regular Abdomen:  soft, NT/ND, no masses Skin: without rashes Vascular Exam/Pulses: Right femoral 1+, left femoral 2+; absent pedal pulses Extremities: Left  third toe wound pictured below Musculoskeletal: no muscle wasting or atrophy  Neurologic: A&O X 3;  No focal weakness or paresthesias are detected; speech is fluent/normal Psychiatric:  The pt has Normal affect. Lymph:  Unremarkable       CBC    Component Value Date/Time   WBC 4.6 08/03/2024 0417   RBC 4.19 (L) 08/03/2024 0417   HGB 11.4 (L) 08/03/2024 0417   HGB 12.6 (L) 12/21/2022 1021   HCT 35.0 (L) 08/03/2024 0417   HCT 38.5 12/21/2022 1021   PLT 178 08/03/2024 0417   PLT 161 12/21/2022 1021   MCV 83.5 08/03/2024 0417   MCV 88 12/21/2022 1021   MCH 27.2 08/03/2024 0417   MCHC 32.6 08/03/2024 0417   RDW 15.3 08/03/2024 0417   RDW 13.7 12/21/2022 1021   LYMPHSABS 1.1 04/28/2011 1440   MONOABS 0.6 04/28/2011 1440   EOSABS 0.1 04/28/2011 1440   BASOSABS 0.0 04/28/2011 1440    BMET    Component Value Date/Time   NA 137 08/03/2024 0417   NA 138 12/21/2022 1021   K 3.5 08/03/2024 0417   CL 100 08/03/2024 0417   CO2 23 08/03/2024 0417   GLUCOSE 155 (H) 08/03/2024 0417   BUN 11 08/03/2024 0417   BUN 13 12/21/2022 1021   CREATININE 0.78 08/03/2024 0417   CALCIUM  8.5 (L) 08/03/2024 0417   GFRNONAA >60 08/03/2024 0417   GFRAA >60  04/28/2011 1440    COAGS: Lab Results  Component Value Date   INR 1.0 11/15/2023   INR 1.1 01/14/2023   INR 1.01 04/29/2011     Non-Invasive Vascular Imaging:    MRI left foot positive for third toe osteomyelitis  Left ABI 0.6 with a toe pressure of 56 mmHg     ASSESSMENT/PLAN: This is a 89 y.o. male with left third toe osteomyelitis in the presence of PAD  [name] is a 89 year old male being seen in consultation for PAD with left third toe osteomyelitis.  Workup included MRI which was positive for osteomyelitis.  Workup also included left ABI of 0.6 with a toe pressure of 56 mmHg.  He does not have adequate circulation for healing left toe wound or a toe amputation.  Plan will be for LLE angiogram via right common femoral artery on Monday with Dr. Sheree.  Case was discussed in detail with the patient and his wife and they are agreeable to proceed.  Continue broad-spectrum IV antibiotics for now.  On-call vascular surgeon Dr. Lanis was also involved in the evaluation and management plan of this patient today.   Omar Sender PA-C [facility] [phone]  VASCULAR STAFF ADDENDUM: I have independently interviewed and examined the patient. I agree with the above.  Patient with left third toe osteomyelitis and nonpalpable pulse in the foot.  ABIs and TBI's are depressed.  I had a long conversation with both he and his wife regarding his diagnosis of critical limb ischemia with tissue loss in the foot.  We discussed that he would benefit from left lower extremity angiogram in effort to define improve distal perfusion to promote wound healing.  After discussing these risks and benefits, they elected to proceed.  Will plan for Monday. Please make n.p.o. Sunday night.  Fonda FORBES Lanis MD [facility] Phone Number: [phone] 08/03/2024 7:43 PM

## 2024-08-03 NOTE — Progress Notes (Signed)
 PROGRESS NOTE    Omar Haley  FMW:[id number] DOB: [birth year] DOA: 08/02/2024 PCP: Larnell Hamilton, MD    Brief Narrative:  This [name] with medical history significant of  HTN, Aortic stenosis, HFrEF, prostate cancer, HLD, TAVR, lumbar laminectomy and hypothyroidism presents to the ED with complaints of longstanding swelling of his left foot with infection.  He is following up with podiatrist and had an MRI of his left foot which is positive for osteomyelitis of the distal third phalanx and cellulitis appears to be progressively getting worse.  Patient has taken multiple courses of oral doxycycline for his cellulitis but shows no significant improvement.  Lower extremity Doppler is negative for DVT.  Patient was directly admitted from podiatry office for osteomyelitis.  Patient is scheduled for partial left third toe amputation tomorrow  Assessment & Plan:   Principal Problem:   Acute osteomyelitis of left foot (HCC) Active Problems:   Essential hypertension   Hypothyroidism   Hyperlipidemia   History of transcatheter aortic valve replacement (TAVR)   History of lumbar laminectomy   HFrEF (heart failure with reduced ejection fraction) (HCC)   History of prostate cancer   Acute osteomyelitis of the left foot: Patient is direct admit from podiatrist office for management of left foot osteomyelitis. MRI of the left foot showing osteomyelitis of the third distal  phalanx. Overlying cutaneous irregularity and thickening. No  loculated fluid collection.  Patient failed outpatient treatment with doxycycline multiple courses. Obtain ABI index of the lower extremities, blood cultures and initiate IV antibiotics given patient has a prosthetic heart valve. Follow up cultures. Initiated on IV vancomycin  and cefepime . Patient is hemodynamically stable.  Dr. Malvin has evaluated the patient and scheduled patient tomorrow for partial left third toe amputation.   Essential  hypertension: Continue amlodipine  and losartan .   Hypothyroidism: Continue levothyroxine    History of TAVR: Patient is on amoxicillin  for chronic prophylaxis.   Currently holding amoxicillin  given treating with vancomycin  and cefepime .   HFrEF Hypertension Continue amlodipine  and losartan .     History of lumbar laminectomy: Continue adequate pain control.   DVT prophylaxis: Heparin  sq Code Status: Full code Family Communication:Wife at bed side. Disposition Plan: Status is: Inpatient Remains inpatient appropriate because: Admitted for osteomyelitis of the left third phalanx.  Podiatry is consulted and scheduled for OR tomorrow.    Consultants:  Podiatry  Procedures:  Antimicrobials:  Anti-infectives (From admission, onward)    Start     Dose/Rate Route Frequency Ordered Stop   08/03/24 2000  vancomycin  (VANCOREADY) IVPB 1250 mg/250 mL        1,250 mg 166.7 mL/hr over 90 Minutes Intravenous Every 24 hours 08/02/24 2011     08/02/24 2100  ceFEPIme  (MAXIPIME ) 2 g in sodium chloride  0.9 % 100 mL IVPB        2 g 200 mL/hr over 30 Minutes Intravenous Every 12 hours 08/02/24 2005     08/02/24 2100  vancomycin  (VANCOREADY) IVPB 1250 mg/250 mL        1,250 mg 166.7 mL/hr over 90 Minutes Intravenous  Once 08/02/24 2006 08/02/24 2359      Subjective: Patient was seen and examined at bedside.  Overnight events noted. Patient did have a chronic wound but denies any pain due to neuropathy.  Objective: Vitals:   08/02/24 2011 08/02/24 2324 08/03/24 0402 08/03/24 0805  BP:  (!) 165/56 (!) 160/57 (!) 169/56  Pulse:  63 60 64  Resp:  17 17 18   Temp:  98.8 F (  37.1 C) 98.6 F (37 C) 98.4 F (36.9 C)  TempSrc:  Oral Oral Oral  SpO2: 95% 94% 95% 97%   No intake or output data in the 24 hours ending 08/03/24 1414 There were no vitals filed for this visit.  Examination:  General exam: Appears calm and comfortable, deconditioned, not in any acute distress.   Respiratory  system: CTA Bilaterally. Respiratory effort normal.  RR 14 Cardiovascular system: S1 & S2 heard, RRR. No JVD, murmurs, rubs, gallops or clicks.  Gastrointestinal system: Abdomen is non distended, soft and non tender. Normal bowel sounds heard. Central nervous system: Alert and oriented x 3. No focal neurological deficits. Extremities: No edema, no cyanosis, no clubbing.  Left third toe redness and erythema noted Skin: No rashes, lesions or ulcers Psychiatry: Judgement and insight appear normal. Mood & affect appropriate.     Data Reviewed: I have personally reviewed following labs and imaging studies  CBC: Recent Labs  Lab 08/02/24 2036 08/03/24 0417  WBC 4.8 4.6  HGB 11.1* 11.4*  HCT 33.7* 35.0*  MCV 83.4 83.5  PLT 187 178   Basic Metabolic Panel: Recent Labs  Lab 08/02/24 2036 08/03/24 0417  NA 135 137  K 3.6 3.5  CL 99 100  CO2 24 23  GLUCOSE 231* 155*  BUN 13 11  CREATININE 0.91 0.78  CALCIUM  8.7* 8.5*   GFR: Estimated Creatinine Clearance: 63 mL/min (by C-G formula based on SCr of 0.78 mg/dL). Liver Function Tests: Recent Labs  Lab 08/02/24 2036  AST 17  ALT 14  ALKPHOS 72  BILITOT 0.6  PROT 6.2*  ALBUMIN 3.4*   No results for input(s): LIPASE, AMYLASE in the last 168 hours. No results for input(s): AMMONIA in the last 168 hours. Coagulation Profile: No results for input(s): INR, PROTIME in the last 168 hours. Cardiac Enzymes: No results for input(s): CKTOTAL, CKMB, CKMBINDEX, TROPONINI in the last 168 hours. BNP (last 3 results) No results for input(s): PROBNP in the last 8760 hours. HbA1C: No results for input(s): HGBA1C in the last 72 hours. CBG: No results for input(s): GLUCAP in the last 168 hours. Lipid Profile: No results for input(s): CHOL, HDL, LDLCALC, TRIG, CHOLHDL, LDLDIRECT in the last 72 hours. Thyroid  Function Tests: No results for input(s): TSH, T4TOTAL, FREET4, T3FREE, THYROIDAB in the  last 72 hours. Anemia Panel: No results for input(s): VITAMINB12, FOLATE, FERRITIN, TIBC, IRON, RETICCTPCT in the last 72 hours. Sepsis Labs: No results for input(s): PROCALCITON, LATICACIDVEN in the last 168 hours.  Recent Results (from the past 240 hours)  Culture, blood (Routine X 2) w Reflex to ID Panel     Status: None (Preliminary result)   Collection Time: 08/02/24  8:30 PM   Specimen: BLOOD  Result Value Ref Range Status   Specimen Description BLOOD SITE NOT SPECIFIED  Final   Special Requests   Final    BOTTLES DRAWN AEROBIC AND ANAEROBIC Blood Culture adequate volume   Culture   Final    NO GROWTH < 12 HOURS Performed at [facility], [address] [place], [place] [address]    Report Status PENDING  Incomplete  Culture, blood (Routine X 2) w Reflex to ID Panel     Status: None (Preliminary result)   Collection Time: 08/02/24  8:36 PM   Specimen: BLOOD  Result Value Ref Range Status   Specimen Description BLOOD SITE NOT SPECIFIED  Final   Special Requests   Final    BOTTLES DRAWN AEROBIC AND  ANAEROBIC Blood Culture adequate volume   Culture   Final    NO GROWTH < 12 HOURS Performed at [facility], [address], [place], [place] [address]    Report Status PENDING  Incomplete         Radiology Studies: MR FOOT LEFT WO CONTRAST Result Date: 08/02/2024 CLINICAL DATA:  Sore on the third toe. EXAM: MRI OF THE LEFT FOOT WITHOUT CONTRAST TECHNIQUE: Multiplanar, multisequence MR imaging of the left forefoot was performed. No intravenous contrast was administered. COMPARISON:  None Available. FINDINGS: Bones/Joint/Cartilage Marrow edema and confluent T1 hypointensity of the third distal phalanx, compatible with osteomyelitis. No marrow signal abnormality identified elsewhere to suggest osteomyelitis. No fracture or dislocation. No joint effusion. Mild degenerative changes of the first MTP joint with joint space narrowing and osteophytosis.  Ligaments Collateral ligaments are intact. Muscles and Tendons No significant tenosynovitis. Mild fatty infiltration and increased T2 signal of the intrinsic foot musculature, may reflect chronic denervation change. Soft tissue Cutaneous irregularity and thickening of the distal third toe. No loculated fluid collection. Subcutaneous edema extending along the dorsal forefoot. IMPRESSION: 1. Findings compatible with osteomyelitis of the third distal phalanx. Overlying cutaneous irregularity and thickening. No loculated fluid collection. 2. Mild osteoarthritis of the first MTP joint. 3. Subcutaneous edema extending along the dorsal forefoot. Electronically Signed   By: Harrietta Sherry M.D.   On: 08/02/2024 11:18   Scheduled Meds:  amLODipine   10 mg Oral Daily   aspirin  EC  81 mg Oral Daily   docusate sodium   100 mg Oral BID   heparin   5,000 Units Subcutaneous Q8H   levothyroxine   50 mcg Oral Q0600   losartan   25 mg Oral Daily   sodium chloride  flush  3 mL Intravenous Q12H   sodium chloride  flush  3 mL Intravenous Q12H   Continuous Infusions:  sodium chloride      ceFEPime  (MAXIPIME ) IV 2 g (08/03/24 1002)   vancomycin        LOS: 1 day    Time spent: 50 mins    Darcel Dawley, MD [facility]   If 7PM-7AM, please contact night-coverage

## 2024-08-03 NOTE — Consult Note (Addendum)
 Hospital Consult    Reason for Consult: Left third toe osteomyelitis Requesting Physician: [facility], podiatry MRN #:  [id number]  History of Present Illness: This is a 89 y.o. male with past medical history significant for hypertension, hyperlipidemia, diabetes mellitus, CHF.  He is being seen in consultation for evaluation of left third toe osteomyelitis in the presence of PAD.  Workup included ABI/TBI demonstrating a left ABI of 0.6 with a toe pressure of 56 mmHg.  He denies claudication or rest pain.  He states the wound started on the toe tip about 2 to 3 months ago.  He states he wound slowly worsened despite using Neosporin.  He was scheduled for a third toe amputation tomorrow with podiatry.  He has been started on broad-spectrum antibiotics.  He denies any fevers, chills, nausea/vomiting.  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 Wendel & Dr. Lucas   Unifocal PVCs    Vitamin D deficiency     Past Surgical History:  Procedure Laterality Date   APPENDECTOMY     COLON SURGERY  09/13/1989   rt hemicolectomy   HERNIA REPAIR     INTRAOPERATIVE TRANSTHORACIC ECHOCARDIOGRAM N/A 01/18/2023   Procedure: INTRAOPERATIVE TRANSTHORACIC ECHOCARDIOGRAM;  Surgeon: Omar Lurena POUR, MD;  Location: [facility];  Service: Open Heart Surgery;  Laterality: N/A;   LUMBAR LAMINECTOMY/DECOMPRESSION MICRODISCECTOMY Left 11/21/2023    Procedure: Left Lumbar Four-Five Microdiscectomy;  Surgeon: Omar Repass Alm Hamilton, MD;  Location: [facility] OR;  Service: Neurosurgery;  Laterality: Left;   MELANOMA EXCISION  11/2005&04/2011   skin   RIGHT/LEFT HEART CATH AND CORONARY ANGIOGRAPHY N/A 01/06/2023   Procedure: RIGHT/LEFT HEART CATH AND CORONARY ANGIOGRAPHY;  Surgeon: Omar Lurena POUR, MD;  Location: [facility];  Service: Cardiovascular;  Laterality: N/A;   TRANSCATHETER AORTIC VALVE REPLACEMENT, TRANSFEMORAL N/A 01/18/2023   Procedure: Transcatheter Aortic Valve Replacement, Transfemoral;  Surgeon: Haley, Omar K, MD;  Location: [facility];  Service: Open Heart Surgery;  Laterality: N/A;    Allergies  Allergen Reactions   Aldactone  [Spironolactone ] Itching    Scalp itching   Lopid [Gemfibrozil] Other (See Comments)    Unknown reaction   Micardis [Telmisartan] Diarrhea   Vasotec [Enalapril] Cough   Zetia [Ezetimibe] Other (See Comments)    Laryngitis    Coreg  [Carvedilol ] Rash   Glucophage [Metformin] Rash    Prior to Admission medications   Medication Sig Start Date End Date Taking? Authorizing Provider  acetaminophen  (TYLENOL ) 650 MG CR tablet Take 650 mg by mouth every 8 (eight) hours as needed for pain.   Yes [provider]  amLODipine  (NORVASC ) 10 MG tablet TAKE 1 TABLET(10 MG) BY MOUTH DAILY 03/07/24  Yes Omar Lamarr SAUNDERS, PA-C  Aspirin  81 MG EC tablet Take 81 mg by mouth daily.   Yes [provider]  doxycycline (VIBRAMYCIN) 100 MG capsule Take 100 mg by mouth 2 (two) times daily. 10 day course. 07/05/24  Yes [provider]  latanoprost  (XALATAN ) 0.005 % ophthalmic solution Place 1 drop into both eyes at bedtime.   Yes [provider]  levothyroxine  (SYNTHROID ) 50 MCG tablet Take 50 mcg by mouth daily before breakfast. 04/05/18  Yes [provider]  losartan  (COZAAR ) 25 MG tablet TAKE 1 TABLET(25 MG) BY MOUTH AT BEDTIME 06/05/24  Yes Haley, Omar K, MD   triamcinolone cream (KENALOG) 0.1 % Apply 1 Application topically 2 (two) times daily as needed (skin irritation). 07/05/24  Yes [provider]  amoxicillin  (AMOXIL ) 500 MG tablet Take 4 tablets by mouth 1 hour prior to dental procedures and cleanings. Patient not taking: No sig reported 01/26/23   Omar Lamarr SAUNDERS, PA-C    Social History   Socioeconomic History   Marital status: Married    Spouse name: Not on file   Number of children: Not on file   Years of education: Not on file   Highest education level: Not on file  Occupational History   Not on file  Tobacco Use   Smoking status: Never   Smokeless tobacco: Never  Vaping Use   Vaping status: Never Used  Substance and Sexual Activity   Alcohol  use: No   Drug use: No   Sexual activity: Not on file  Other Topics Concern   Not on file  Social History Narrative   Not on file   Social Drivers of Health   Financial Resource Strain: Not on file  Food Insecurity: No Food Insecurity (08/02/2024)   Hunger Vital Sign    Worried About Running Out of Food in the Last Year: Never true    Ran Out of Food in the Last Year: Never true  Transportation Needs: No Transportation Needs (08/02/2024)   PRAPARE - Administrator, Civil Service (Medical): No    Lack of Transportation (Non-Medical): No  Physical Activity: Not on file  Stress: Not on file  Social Connections: Moderately Integrated (08/02/2024)   Social Connection and Isolation Panel    Frequency of Communication with Friends and Family: Twice a week    Frequency of Social Gatherings with Friends and Family: Twice a week    Attends Religious Services: More than 4 times per year    Active Member of [employer] or Organizations: No    Attends Banker Meetings: Never    Marital Status: Married  Catering Manager Violence: Not At Risk (08/02/2024)   Humiliation, Afraid, Rape, and Kick questionnaire    Fear of Current or Ex-Partner: No     Emotionally Abused: No    Physically Abused: No    Sexually Abused: No    Family History  Problem Relation Age of Onset   Heart failure Mother    Cerebral aneurysm Father     ROS: Otherwise negative unless mentioned in HPI  Physical Examination  Vitals:   08/03/24 0805 08/03/24 1445  BP: (!) 169/56 (!) 162/61  Pulse: 64 65  Resp: 18 18  Temp: 98.4 F (36.9 C) 98.4 F (36.9 C)  SpO2: 97% 97%   There is no height or weight on file to calculate BMI.  General:  WDWN in NAD Gait: Not observed HENT: WNL, normocephalic Pulmonary: normal non-labored breathing Cardiac: regular Abdomen:  soft, NT/ND, no masses Skin: without rashes Vascular Exam/Pulses: Right femoral 1+, left femoral 2+; absent pedal pulses Extremities: Left  third toe wound pictured below Musculoskeletal: no muscle wasting or atrophy  Neurologic: A&O X 3;  No focal weakness or paresthesias are detected; speech is fluent/normal Psychiatric:  The pt has Normal affect. Lymph:  Unremarkable       CBC    Component Value Date/Time   WBC 4.6 08/03/2024 0417   RBC 4.19 (L) 08/03/2024 0417   HGB 11.4 (L) 08/03/2024 0417   HGB 12.6 (L) 12/21/2022 1021   HCT 35.0 (L) 08/03/2024 0417   HCT 38.5 12/21/2022 1021   PLT 178 08/03/2024 0417   PLT 161 12/21/2022 1021   MCV 83.5 08/03/2024 0417   MCV 88 12/21/2022 1021   MCH 27.2 08/03/2024 0417   MCHC 32.6 08/03/2024 0417   RDW 15.3 08/03/2024 0417   RDW 13.7 12/21/2022 1021   LYMPHSABS 1.1 04/28/2011 1440   MONOABS 0.6 04/28/2011 1440   EOSABS 0.1 04/28/2011 1440   BASOSABS 0.0 04/28/2011 1440    BMET    Component Value Date/Time   NA 137 08/03/2024 0417   NA 138 12/21/2022 1021   K 3.5 08/03/2024 0417   CL 100 08/03/2024 0417   CO2 23 08/03/2024 0417   GLUCOSE 155 (H) 08/03/2024 0417   BUN 11 08/03/2024 0417   BUN 13 12/21/2022 1021   CREATININE 0.78 08/03/2024 0417   CALCIUM  8.5 (L) 08/03/2024 0417   GFRNONAA >60 08/03/2024 0417   GFRAA >60  04/28/2011 1440    COAGS: Lab Results  Component Value Date   INR 1.0 11/15/2023   INR 1.1 01/14/2023   INR 1.01 04/29/2011     Non-Invasive Vascular Imaging:    MRI left foot positive for third toe osteomyelitis  Left ABI 0.6 with a toe pressure of 56 mmHg     ASSESSMENT/PLAN: This is a 89 y.o. male with left third toe osteomyelitis in the presence of PAD  [name] is a 89 year old male being seen in consultation for PAD with left third toe osteomyelitis.  Workup included MRI which was positive for osteomyelitis.  Workup also included left ABI of 0.6 with a toe pressure of 56 mmHg.  He does not have adequate circulation for healing left toe wound or a toe amputation.  Plan will be for LLE angiogram via right common femoral artery on Monday with Dr. Sheree.  Case was discussed in detail with the patient and his wife and they are agreeable to proceed.  Continue broad-spectrum IV antibiotics for now.  On-call vascular surgeon Dr. Lanis was also involved in the evaluation and management plan of this patient today.   Omar Sender PA-C [facility] [phone]  VASCULAR STAFF ADDENDUM: I have independently interviewed and examined the patient. I agree with the above.  Patient with left third toe osteomyelitis and nonpalpable pulse in the foot.  ABIs and TBI's are depressed.  I had a long conversation with both he and his wife regarding his diagnosis of critical limb ischemia with tissue loss in the foot.  We discussed that he would benefit from left lower extremity angiogram in effort to define improve distal perfusion to promote wound healing.  After discussing these risks and benefits, they elected to proceed.  Will plan for Monday. Please make n.p.o. Sunday night.  Omar FORBES Lanis MD [facility] Phone Number: [phone] 08/03/2024 7:43 PM

## 2024-08-04 DIAGNOSIS — M86172 Other acute osteomyelitis, left ankle and foot: Secondary | ICD-10-CM | POA: Diagnosis not present

## 2024-08-04 LAB — HEMOGLOBIN A1C
Hgb A1c MFr Bld: 8 % — ABNORMAL HIGH (ref 4.8–5.6)
Mean Plasma Glucose: 182.9 mg/dL

## 2024-08-04 MED ORDER — INSULIN ASPART 100 UNIT/ML IJ SOLN
0.0000 [IU] | Freq: Three times a day (TID) | INTRAMUSCULAR | Status: DC
  Administered 2024-08-04: 5 [IU] via SUBCUTANEOUS
  Administered 2024-08-04 – 2024-08-05 (×3): 1 [IU] via SUBCUTANEOUS
  Administered 2024-08-06 – 2024-08-07 (×4): 2 [IU] via SUBCUTANEOUS
  Administered 2024-08-07: 1 [IU] via SUBCUTANEOUS
  Filled 2024-08-04 (×4): qty 1
  Filled 2024-08-04: qty 5
  Filled 2024-08-04 (×4): qty 2

## 2024-08-04 NOTE — Plan of Care (Signed)

## 2024-08-04 NOTE — Plan of Care (Signed)
   Problem: Health Behavior/Discharge Planning: Goal: Ability to manage health-related needs will improve Outcome: Progressing   Problem: Clinical Measurements: Goal: Ability to maintain clinical measurements within normal limits will improve Outcome: Progressing Goal: Will remain free from infection Outcome: Progressing

## 2024-08-04 NOTE — Progress Notes (Signed)
 PROGRESS NOTE    Omar Haley  FMW:[id number] DOB: 09-May-1931 DOA: 08/02/2024 PCP: Larnell Hamilton, MD    Brief Narrative:  This [name] with medical history significant of  HTN, Aortic stenosis, HFrEF, prostate cancer, HLD, TAVR, lumbar laminectomy and hypothyroidism presents to the ED with complaints of longstanding swelling of his left foot with infection.  He is following up with podiatrist and had an MRI of his left foot which is positive for osteomyelitis of the distal third phalanx and cellulitis appears to be progressively getting worse.  Patient has taken multiple courses of oral doxycycline for his cellulitis but shows no significant improvement.  Lower extremity Doppler is negative for DVT.  Patient was directly admitted from podiatry office for osteomyelitis.  Patient has abnormal ABI on the left side.  Vascular surgery is consulted.  Patient is scheduled for angiogram on Monday.  Assessment & Plan:   Principal Problem:   Acute osteomyelitis of left foot (HCC) Active Problems:   Essential hypertension   Hypothyroidism   Hyperlipidemia   History of transcatheter aortic valve replacement (TAVR)   History of lumbar laminectomy   HFrEF (heart failure with reduced ejection fraction) (HCC)   History of prostate cancer   Acute osteomyelitis of the left foot: Critical left lower extremity ischemia: Patient is a direct admit from podiatrist office for management of left foot osteomyelitis. MRI of the left foot showing osteomyelitis of the third distal phalanx. Overlying cutaneous irregularity and thickening. No  loculated fluid collection.  Patient failed outpatient treatment with doxycycline multiple courses. Initiated on  IV antibiotics given patient has a prosthetic heart valve. Initiated on IV vancomycin  and cefepime . Patient has abnormal ABI on the left side.  Vascular surgery is consulted. Patient is now scheduled for left lower extremity angiogram on Monday in an  effort to improve distal perfusion to promote wound healing. Patient is hemodynamically stable.  Dr. Malvin will consider possible amputation based on angiogram on Monday.   Essential hypertension: Continue amlodipine  and losartan .   Hypothyroidism: Continue levothyroxine    History of TAVR: Patient is on amoxicillin  for chronic prophylaxis.   Currently holding amoxicillin  given treating with vancomycin  and cefepime .   HFrEF Continue amlodipine  and losartan .     History of lumbar laminectomy: Continue adequate pain control.   DVT prophylaxis: Heparin  sq Code Status: Full code Family Communication:Wife at bed side. Disposition Plan: Status is: Inpatient Remains inpatient appropriate because: Admitted for osteomyelitis of the left third phalanx.   Podiatry is consulted.  Vascular is consulted for abnormal ABI.  Now scheduled for  LLE angiogram on Monday   Consultants:  Podiatry Vascular surgery  Procedures:  Antimicrobials:  Anti-infectives (From admission, onward)    Start     Dose/Rate Route Frequency Ordered Stop   08/03/24 2000  vancomycin  (VANCOREADY) IVPB 1250 mg/250 mL        1,250 mg 166.7 mL/hr over 90 Minutes Intravenous Every 24 hours 08/02/24 2011     08/02/24 2100  ceFEPIme  (MAXIPIME ) 2 g in sodium chloride  0.9 % 100 mL IVPB        2 g 200 mL/hr over 30 Minutes Intravenous Every 12 hours 08/02/24 2005     08/02/24 2100  vancomycin  (VANCOREADY) IVPB 1250 mg/250 mL        1,250 mg 166.7 mL/hr over 90 Minutes Intravenous  Once 08/02/24 2006 08/02/24 2359      Subjective: Patient was seen and examined at bedside.  Overnight events noted. Patient did have a chronic  wound but denies any pain due to neuropathy. Patient is scheduled for left lower extremity angiogram on Monday.  Objective: Vitals:   08/03/24 1445 08/03/24 1944 08/04/24 0345 08/04/24 0815  BP: (!) 162/61 (!) 158/58 (!) 155/54 (!) 172/56  Pulse: 65 64 (!) 57 (!) 57  Resp: 18 18 18 18    Temp: 98.4 F (36.9 C) 98.7 F (37.1 C) 97.8 F (36.6 C) 98.2 F (36.8 C)  TempSrc: Oral     SpO2: 97% 95% 93% 96%    Intake/Output Summary (Last 24 hours) at 08/04/2024 1118 Last data filed at 08/04/2024 0700 Gross per 24 hour  Intake 200 ml  Output 100 ml  Net 100 ml   There were no vitals filed for this visit.  Examination:  General exam: Appears calm and comfortable, deconditioned, not in any acute distress.   Respiratory system: CTA Bilaterally. Respiratory effort normal.  RR 13 Cardiovascular system: S1 & S2 heard, RRR. No JVD, murmurs, rubs, gallops or clicks.  Gastrointestinal system: Abdomen is non distended, soft and non tender. Normal bowel sounds heard. Central nervous system: Alert and oriented x 3. No focal neurological deficits. Extremities: No edema, no cyanosis, no clubbing.  Left third toe redness and erythema noted Skin: No rashes, lesions or ulcers Psychiatry: Judgement and insight appear normal. Mood & affect appropriate.     Data Reviewed: I have personally reviewed following labs and imaging studies  CBC: Recent Labs  Lab 08/02/24 2036 08/03/24 0417  WBC 4.8 4.6  HGB 11.1* 11.4*  HCT 33.7* 35.0*  MCV 83.4 83.5  PLT 187 178   Basic Metabolic Panel: Recent Labs  Lab 08/02/24 2036 08/03/24 0417  NA 135 137  K 3.6 3.5  CL 99 100  CO2 24 23  GLUCOSE 231* 155*  BUN 13 11  CREATININE 0.91 0.78  CALCIUM  8.7* 8.5*   GFR: Estimated Creatinine Clearance: 63 mL/min (by C-G formula based on SCr of 0.78 mg/dL). Liver Function Tests: Recent Labs  Lab 08/02/24 2036  AST 17  ALT 14  ALKPHOS 72  BILITOT 0.6  PROT 6.2*  ALBUMIN 3.4*   No results for input(s): LIPASE, AMYLASE in the last 168 hours. No results for input(s): AMMONIA in the last 168 hours. Coagulation Profile: No results for input(s): INR, PROTIME in the last 168 hours. Cardiac Enzymes: No results for input(s): CKTOTAL, CKMB, CKMBINDEX, TROPONINI in the  last 168 hours. BNP (last 3 results) No results for input(s): PROBNP in the last 8760 hours. HbA1C: Recent Labs    08/04/24 0822  HGBA1C 8.0*   CBG: No results for input(s): GLUCAP in the last 168 hours. Lipid Profile: No results for input(s): CHOL, HDL, LDLCALC, TRIG, CHOLHDL, LDLDIRECT in the last 72 hours. Thyroid  Function Tests: No results for input(s): TSH, T4TOTAL, FREET4, T3FREE, THYROIDAB in the last 72 hours. Anemia Panel: No results for input(s): VITAMINB12, FOLATE, FERRITIN, TIBC, IRON, RETICCTPCT in the last 72 hours. Sepsis Labs: No results for input(s): PROCALCITON, LATICACIDVEN in the last 168 hours.  Recent Results (from the past 240 hours)  Culture, blood (Routine X 2) w Reflex to ID Panel     Status: None (Preliminary result)   Collection Time: 08/02/24  8:30 PM   Specimen: BLOOD  Result Value Ref Range Status   Specimen Description BLOOD SITE NOT SPECIFIED  Final   Special Requests   Final    BOTTLES DRAWN AEROBIC AND ANAEROBIC Blood Culture adequate volume   Culture   Final    NO  GROWTH 2 DAYS Performed at [facility], [address] [place], [place] [address]    Report Status PENDING  Incomplete  Culture, blood (Routine X 2) w Reflex to ID Panel     Status: None (Preliminary result)   Collection Time: 08/02/24  8:36 PM   Specimen: BLOOD  Result Value Ref Range Status   Specimen Description BLOOD SITE NOT SPECIFIED  Final   Special Requests   Final    BOTTLES DRAWN AEROBIC AND ANAEROBIC Blood Culture adequate volume   Culture   Final    NO GROWTH 2 DAYS Performed at [facility], [address], [place], [place] [address]    Report Status PENDING  Incomplete    Radiology Studies: VAS US  ABI WITH/WO TBI Result Date: 08/03/2024  LOWER EXTREMITY DOPPLER STUDY Patient Name:  Zaiden Ludlum  Date of Exam:   08/03/2024 Medical Rec #: [id number]      Accession #:    [id number] Date of Birth: 30-Jun-1931      Patient Gender: M Patient Age:   42 years Exam Location:  [facility] Procedure:      VAS US  ABI WITH/WO TBI Referring Phys: MICAELA SUNDIL --------------------------------------------------------------------------------  Indications: Left toe ulceration / osteomyelitis High Risk Factors: Hypertension, hyperlipidemia, coronary artery disease. Other Factors: Hx of TAVR, CHF, Hx of prostate cancer with radiation treatment.  Comparison Study: No previous exams Performing Technologist: Leigh Rom RVT/RDMS  Examination Guidelines: A complete evaluation includes at minimum, Doppler waveform signals and systolic blood pressure reading at the level of bilateral brachial, anterior tibial, and posterior tibial arteries, when vessel segments are accessible. Bilateral testing is considered an integral part of a complete examination. Photoelectric Plethysmograph (PPG) waveforms and toe systolic pressure readings are included as required and additional duplex testing as needed. Limited examinations for reoccurring indications may be performed as noted.  ABI Findings: +---------+------------------+-----+----------+--------+ Right    Rt Pressure (mmHg)IndexWaveform  Comment  +---------+------------------+-----+----------+--------+ Brachial 177                    triphasic          +---------+------------------+-----+----------+--------+ PTA      70                0.39 monophasic         +---------+------------------+-----+----------+--------+ DP       72                0.40 monophasic         +---------+------------------+-----+----------+--------+ Great Toe38                0.21 Abnormal           +---------+------------------+-----+----------+--------+ +---------+------------------+-----+----------+-------+ Left     Lt Pressure (mmHg)IndexWaveform  Comment +---------+------------------+-----+----------+-------+ Brachial 180                    triphasic          +---------+------------------+-----+----------+-------+ PTA      118               0.66 monophasic        +---------+------------------+-----+----------+-------+ DP       65                0.36 monophasic        +---------+------------------+-----+----------+-------+ Great Toe56                0.31 Abnormal          +---------+------------------+-----+----------+-------+  Summary: Right: Resting right ankle-brachial index indicates severe right lower extremity arterial disease. The right toe-brachial index is abnormal.  Left: Resting left ankle-brachial index indicates moderate left lower extremity arterial disease. The left toe-brachial index is abnormal.  *See table(s) above for measurements and observations.  Suggest Peripheral Vascular Consult. Electronically signed by Fonda Rim on 08/03/2024 at 7:24:09 PM.    Final    Scheduled Meds:  amLODipine   10 mg Oral Daily   aspirin  EC  81 mg Oral Daily   docusate sodium   100 mg Oral BID   heparin   5,000 Units Subcutaneous Q8H   insulin  aspart  0-9 Units Subcutaneous TID WC   levothyroxine   50 mcg Oral Q0600   losartan   25 mg Oral Daily   sodium chloride  flush  3 mL Intravenous Q12H   sodium chloride  flush  3 mL Intravenous Q12H   Continuous Infusions:  ceFEPime  (MAXIPIME ) IV 2 g (08/04/24 0933)   vancomycin  1,250 mg (08/03/24 2004)     LOS: 2 days    Time spent: 35 mins    Darcel Dawley, MD [facility]   If 7PM-7AM, please contact night-coverage

## 2024-08-04 NOTE — Progress Notes (Addendum)
 AC Lunch BG 293 - Has not transferred Epic at this time.  Confirmed with Charge Nurse, Tinnie.   AC Dinner BG 149

## 2024-08-05 DIAGNOSIS — M86172 Other acute osteomyelitis, left ankle and foot: Secondary | ICD-10-CM | POA: Diagnosis not present

## 2024-08-05 NOTE — Progress Notes (Signed)
 AC Lunch BG - 145  AC Dinner BG - 150  Pt treated accordingly.

## 2024-08-05 NOTE — Plan of Care (Signed)

## 2024-08-05 NOTE — Progress Notes (Signed)
 PROGRESS NOTE    Omar Haley  FMW:[id number] DOB: February 22, 1931 DOA: 08/02/2024 PCP: Larnell Hamilton, MD    Brief Narrative:  This [name] with medical history significant of  HTN, Aortic stenosis, HFrEF, prostate cancer, HLD, TAVR, lumbar laminectomy and hypothyroidism presents to the ED with complaints of longstanding swelling of his left foot with infection.  He is following up with podiatrist and had an MRI of his left foot which is positive for osteomyelitis of the distal third phalanx and cellulitis appears to be progressively getting worse.  Patient has taken multiple courses of oral doxycycline for his cellulitis but shows no significant improvement.  Lower extremity Doppler is negative for DVT.  Patient was directly admitted from podiatry office for osteomyelitis.  Patient has abnormal ABI on the left side.  Vascular surgery is consulted.  Patient is scheduled for angiogram on Monday.  Assessment & Plan:   Principal Problem:   Acute osteomyelitis of left foot (HCC) Active Problems:   Essential hypertension   Hypothyroidism   Hyperlipidemia   History of transcatheter aortic valve replacement (TAVR)   History of lumbar laminectomy   HFrEF (heart failure with reduced ejection fraction) (HCC)   History of prostate cancer   Acute osteomyelitis of the left foot: Critical left lower extremity ischemia: Patient is a direct admit from podiatrist office for management of left foot osteomyelitis. MRI of the left foot showing osteomyelitis of the third distal phalanx. Overlying cutaneous irregularity and thickening. No  loculated fluid collection.  Patient failed outpatient treatment with doxycycline multiple courses. Initiated on  IV antibiotics given patient has a prosthetic heart valve. Initiated on IV vancomycin  and cefepime . Patient has abnormal ABI on the left side.  Vascular surgery is consulted. Patient is now scheduled for left lower extremity angiogram on Monday in an  effort to improve distal perfusion to promote wound healing. Patient is hemodynamically stable.  Dr. Malvin will consider possible amputation based on angiogram on Monday.   Essential hypertension: Continue amlodipine  and losartan .   Hypothyroidism: Continue levothyroxine .   History of TAVR: Patient is on amoxicillin  for chronic prophylaxis.   Currently holding amoxicillin  given treating with vancomycin  and cefepime .   HFrEF Continue amlodipine  and losartan .   History of lumbar laminectomy: Continue adequate pain control.   DVT prophylaxis: Heparin  sq Code Status: Full code Family Communication:Wife at bed side. Disposition Plan: Status is: Inpatient Remains inpatient appropriate because: Admitted for osteomyelitis of the left third phalanx.   Podiatry is consulted.  Vascular is consulted for abnormal ABI.  Now scheduled for  LLE angiogram on Monday   Consultants:  Podiatry Vascular surgery  Procedures:  Antimicrobials:  Anti-infectives (From admission, onward)    Start     Dose/Rate Route Frequency Ordered Stop   08/03/24 2000  vancomycin  (VANCOREADY) IVPB 1250 mg/250 mL        1,250 mg 166.7 mL/hr over 90 Minutes Intravenous Every 24 hours 08/02/24 2011     08/02/24 2100  ceFEPIme  (MAXIPIME ) 2 g in sodium chloride  0.9 % 100 mL IVPB        2 g 200 mL/hr over 30 Minutes Intravenous Every 12 hours 08/02/24 2005     08/02/24 2100  vancomycin  (VANCOREADY) IVPB 1250 mg/250 mL        1,250 mg 166.7 mL/hr over 90 Minutes Intravenous  Once 08/02/24 2006 08/02/24 2359      Subjective: Patient was seen and examined at bedside.  Overnight events noted. Patient did have a chronic wound but  denies any pain due to neuropathy. Patient is scheduled for left lower extremity angiogram on Monday. No other concerns.  Objective: Vitals:   08/04/24 1512 08/04/24 2017 08/05/24 0407 08/05/24 0821  BP: (!) 142/51 (!) 149/57 (!) 147/61 (!) 163/53  Pulse: 60 (!) 58 (!) 59 60   Resp: 16 16 17 18   Temp: 97.8 F (36.6 C) 97.9 F (36.6 C) (!) 97.5 F (36.4 C) 97.8 F (36.6 C)  TempSrc: Oral   Oral  SpO2: 97% 95% 96% 96%    Intake/Output Summary (Last 24 hours) at 08/05/2024 1304 Last data filed at 08/05/2024 0900 Gross per 24 hour  Intake 1280 ml  Output 900 ml  Net 380 ml   There were no vitals filed for this visit.  Examination:  General exam: Appears calm and comfortable, deconditioned, not in any acute distress.   Respiratory system: CTA Bilaterally. Respiratory effort normal.  RR 14 Cardiovascular system: S1 & S2 heard, RRR. No JVD, murmurs, rubs, gallops or clicks.  Gastrointestinal system: Abdomen is non distended, soft and non tender. Normal bowel sounds heard. Central nervous system: Alert and oriented x 3. No focal neurological deficits. Extremities: No edema, no cyanosis, no clubbing.  Left third toe redness and erythema noted Skin: No rashes, lesions or ulcers Psychiatry: Judgement and insight appear normal. Mood & affect appropriate.     Data Reviewed: I have personally reviewed following labs and imaging studies  CBC: Recent Labs  Lab 08/02/24 2036 08/03/24 0417  WBC 4.8 4.6  HGB 11.1* 11.4*  HCT 33.7* 35.0*  MCV 83.4 83.5  PLT 187 178   Basic Metabolic Panel: Recent Labs  Lab 08/02/24 2036 08/03/24 0417  NA 135 137  K 3.6 3.5  CL 99 100  CO2 24 23  GLUCOSE 231* 155*  BUN 13 11  CREATININE 0.91 0.78  CALCIUM  8.7* 8.5*   GFR: Estimated Creatinine Clearance: 63 mL/min (by C-G formula based on SCr of 0.78 mg/dL). Liver Function Tests: Recent Labs  Lab 08/02/24 2036  AST 17  ALT 14  ALKPHOS 72  BILITOT 0.6  PROT 6.2*  ALBUMIN 3.4*   No results for input(s): LIPASE, AMYLASE in the last 168 hours. No results for input(s): AMMONIA in the last 168 hours. Coagulation Profile: No results for input(s): INR, PROTIME in the last 168 hours. Cardiac Enzymes: No results for input(s): CKTOTAL, CKMB,  CKMBINDEX, TROPONINI in the last 168 hours. BNP (last 3 results) No results for input(s): PROBNP in the last 8760 hours. HbA1C: Recent Labs    08/04/24 0822  HGBA1C 8.0*   CBG: No results for input(s): GLUCAP in the last 168 hours. Lipid Profile: No results for input(s): CHOL, HDL, LDLCALC, TRIG, CHOLHDL, LDLDIRECT in the last 72 hours. Thyroid  Function Tests: No results for input(s): TSH, T4TOTAL, FREET4, T3FREE, THYROIDAB in the last 72 hours. Anemia Panel: No results for input(s): VITAMINB12, FOLATE, FERRITIN, TIBC, IRON, RETICCTPCT in the last 72 hours. Sepsis Labs: No results for input(s): PROCALCITON, LATICACIDVEN in the last 168 hours.  Recent Results (from the past 240 hours)  Culture, blood (Routine X 2) w Reflex to ID Panel     Status: None (Preliminary result)   Collection Time: 08/02/24  8:30 PM   Specimen: BLOOD  Result Value Ref Range Status   Specimen Description BLOOD SITE NOT SPECIFIED  Final   Special Requests   Final    BOTTLES DRAWN AEROBIC AND ANAEROBIC Blood Culture adequate volume   Culture   Final  NO GROWTH 2 DAYS Performed at [facility], [address], [place], [place] [address]    Report Status PENDING  Incomplete  Culture, blood (Routine X 2) w Reflex to ID Panel     Status: None (Preliminary result)   Collection Time: 08/02/24  8:36 PM   Specimen: BLOOD  Result Value Ref Range Status   Specimen Description BLOOD SITE NOT SPECIFIED  Final   Special Requests   Final    BOTTLES DRAWN AEROBIC AND ANAEROBIC Blood Culture adequate volume   Culture   Final    NO GROWTH 2 DAYS Performed at [facility], [address], [place], [place] [address]    Report Status PENDING  Incomplete    Radiology Studies: VAS US  ABI WITH/WO TBI Result Date: 08/03/2024  LOWER EXTREMITY DOPPLER STUDY Patient Name:  Eudell Mcphee  Date of Exam:   08/03/2024 Medical Rec #: [id number]      Accession #:     [id number] Date of Birth: 1931-05-08     Patient Gender: M Patient Age:   48 years Exam Location:  [facility] Procedure:      VAS US  ABI WITH/WO TBI Referring Phys: MICAELA SUNDIL --------------------------------------------------------------------------------  Indications: Left toe ulceration / osteomyelitis High Risk Factors: Hypertension, hyperlipidemia, coronary artery disease. Other Factors: Hx of TAVR, CHF, Hx of prostate cancer with radiation treatment.  Comparison Study: No previous exams Performing Technologist: Leigh Rom RVT/RDMS  Examination Guidelines: A complete evaluation includes at minimum, Doppler waveform signals and systolic blood pressure reading at the level of bilateral brachial, anterior tibial, and posterior tibial arteries, when vessel segments are accessible. Bilateral testing is considered an integral part of a complete examination. Photoelectric Plethysmograph (PPG) waveforms and toe systolic pressure readings are included as required and additional duplex testing as needed. Limited examinations for reoccurring indications may be performed as noted.  ABI Findings: +---------+------------------+-----+----------+--------+ Right    Rt Pressure (mmHg)IndexWaveform  Comment  +---------+------------------+-----+----------+--------+ Brachial 177                    triphasic          +---------+------------------+-----+----------+--------+ PTA      70                0.39 monophasic         +---------+------------------+-----+----------+--------+ DP       72                0.40 monophasic         +---------+------------------+-----+----------+--------+ Great Toe38                0.21 Abnormal           +---------+------------------+-----+----------+--------+ +---------+------------------+-----+----------+-------+ Left     Lt Pressure (mmHg)IndexWaveform  Comment +---------+------------------+-----+----------+-------+ Brachial 180                     triphasic         +---------+------------------+-----+----------+-------+ PTA      118               0.66 monophasic        +---------+------------------+-----+----------+-------+ DP       65                0.36 monophasic        +---------+------------------+-----+----------+-------+ Great Toe56                0.31 Abnormal          +---------+------------------+-----+----------+-------+  Summary: Right: Resting right ankle-brachial index indicates severe right lower extremity arterial disease. The right toe-brachial index is abnormal.  Left: Resting left ankle-brachial index indicates moderate left lower extremity arterial disease. The left toe-brachial index is abnormal.  *See table(s) above for measurements and observations.  Suggest Peripheral Vascular Consult. Electronically signed by Fonda Rim on 08/03/2024 at 7:24:09 PM.    Final    Scheduled Meds:  amLODipine   10 mg Oral Daily   aspirin  EC  81 mg Oral Daily   docusate sodium   100 mg Oral BID   heparin   5,000 Units Subcutaneous Q8H   insulin  aspart  0-9 Units Subcutaneous TID WC   levothyroxine   50 mcg Oral Q0600   losartan   25 mg Oral Daily   sodium chloride  flush  3 mL Intravenous Q12H   sodium chloride  flush  3 mL Intravenous Q12H   Continuous Infusions:  ceFEPime  (MAXIPIME ) IV 2 g (08/05/24 1034)   vancomycin  1,250 mg (08/04/24 2052)     LOS: 3 days    Time spent: 35 mins    Darcel Dawley, MD [facility]   If 7PM-7AM, please contact night-coverage

## 2024-08-06 ENCOUNTER — Encounter (HOSPITAL_COMMUNITY): Admission: RE | Disposition: A | Payer: Self-pay | Source: Home / Self Care | Attending: Family Medicine

## 2024-08-06 DIAGNOSIS — M86172 Other acute osteomyelitis, left ankle and foot: Secondary | ICD-10-CM | POA: Diagnosis not present

## 2024-08-06 DIAGNOSIS — I70201 Unspecified atherosclerosis of native arteries of extremities, right leg: Secondary | ICD-10-CM

## 2024-08-06 DIAGNOSIS — L97529 Non-pressure chronic ulcer of other part of left foot with unspecified severity: Secondary | ICD-10-CM

## 2024-08-06 DIAGNOSIS — I70245 Atherosclerosis of native arteries of left leg with ulceration of other part of foot: Secondary | ICD-10-CM | POA: Diagnosis not present

## 2024-08-06 HISTORY — PX: LOWER EXTREMITY INTERVENTION: CATH118252

## 2024-08-06 HISTORY — PX: ABDOMINAL AORTOGRAM W/LOWER EXTREMITY: CATH118223

## 2024-08-06 LAB — BASIC METABOLIC PANEL WITH GFR
Anion gap: 11 (ref 5–15)
BUN: 13 mg/dL (ref 8–23)
CO2: 23 mmol/L (ref 22–32)
Calcium: 8.8 mg/dL — ABNORMAL LOW (ref 8.9–10.3)
Chloride: 105 mmol/L (ref 98–111)
Creatinine, Ser: 0.82 mg/dL (ref 0.61–1.24)
GFR, Estimated: >60 mL/min (ref >60)
Glucose, Bld: 152 mg/dL — ABNORMAL HIGH (ref 70–99)
Potassium: 4.1 mmol/L (ref 3.5–5.1)
Sodium: 139 mmol/L (ref 135–145)

## 2024-08-06 LAB — POCT ACTIVATED CLOTTING TIME
Activated Clotting Time: 164 s
Activated Clotting Time: 182 s

## 2024-08-06 LAB — CBC
HCT: 35.9 % — ABNORMAL LOW (ref 39.0–52.0)
Hemoglobin: 11.5 g/dL — ABNORMAL LOW (ref 13.0–17.0)
MCH: 27.1 pg (ref 26.0–34.0)
MCHC: 32 g/dL (ref 30.0–36.0)
MCV: 84.7 fL (ref 80.0–100.0)
Platelets: 177 K/uL (ref 150–400)
RBC: 4.24 MIL/uL (ref 4.22–5.81)
RDW: 15.3 % (ref 11.5–15.5)
WBC: 4.7 K/uL (ref 4.0–10.5)
nRBC: 0 % (ref 0.0–0.2)

## 2024-08-06 LAB — GLUCOSE, CAPILLARY
Glucose-Capillary: 198 mg/dL — ABNORMAL HIGH (ref 70–99)
Glucose-Capillary: 293 mg/dL — ABNORMAL HIGH (ref 70–99)
Glucose-Capillary: 149 mg/dL — ABNORMAL HIGH (ref 70–99)
Glucose-Capillary: 161 mg/dL — ABNORMAL HIGH (ref 70–99)
Glucose-Capillary: 159 mg/dL — ABNORMAL HIGH (ref 70–99)
Glucose-Capillary: 145 mg/dL — ABNORMAL HIGH (ref 70–99)
Glucose-Capillary: 150 mg/dL — ABNORMAL HIGH (ref 70–99)
Glucose-Capillary: 201 mg/dL — ABNORMAL HIGH (ref 70–99)
Glucose-Capillary: 154 mg/dL — ABNORMAL HIGH (ref 70–99)
Glucose-Capillary: 144 mg/dL — ABNORMAL HIGH (ref 70–99)
Glucose-Capillary: 195 mg/dL — ABNORMAL HIGH (ref 70–99)
Glucose-Capillary: 166 mg/dL — ABNORMAL HIGH (ref 70–99)

## 2024-08-06 LAB — VANCOMYCIN, PEAK: Vancomycin Pk: 33 ug/mL (ref 30–40)

## 2024-08-06 MED ORDER — CLOPIDOGREL BISULFATE 75 MG PO TABS
75.0000 mg | ORAL_TABLET | Freq: Every day | ORAL | Status: DC
  Administered 2024-08-07 – 2024-08-08 (×2): 75 mg via ORAL
  Filled 2024-08-06 (×2): qty 1

## 2024-08-06 MED ORDER — LIDOCAINE HCL (PF) 1 % IJ SOLN
INTRAMUSCULAR | Status: DC | PRN
  Administered 2024-08-06: 10 mL via INTRADERMAL

## 2024-08-06 MED ORDER — OXYCODONE HCL 5 MG PO TABS
5.0000 mg | ORAL_TABLET | Freq: Four times a day (QID) | ORAL | Status: DC | PRN
  Administered 2024-08-06: 5 mg via ORAL
  Filled 2024-08-06: qty 1

## 2024-08-06 MED ORDER — SODIUM CHLORIDE 0.9 % IV SOLN: INTRAVENOUS | Status: AC

## 2024-08-06 MED ORDER — FENTANYL CITRATE (PF) 100 MCG/2ML IJ SOLN
INTRAMUSCULAR | Status: AC
  Filled 2024-08-06: qty 2

## 2024-08-06 MED ORDER — IODIXANOL 320 MG/ML IV SOLN
INTRAVENOUS | Status: DC | PRN
Start: 2024-08-06 — End: 2024-08-06
  Administered 2024-08-06: 95 mL

## 2024-08-06 MED ORDER — FENTANYL CITRATE (PF) 100 MCG/2ML IJ SOLN
INTRAMUSCULAR | Status: DC | PRN
  Administered 2024-08-06: 50 ug via INTRAVENOUS

## 2024-08-06 MED ORDER — CLOPIDOGREL BISULFATE 300 MG PO TABS
ORAL_TABLET | ORAL | Status: DC | PRN
  Administered 2024-08-06: 300 mg via ORAL

## 2024-08-06 MED ORDER — HEPARIN SODIUM (PORCINE) 5000 UNIT/ML IJ SOLN
5000.0000 [IU] | Freq: Three times a day (TID) | INTRAMUSCULAR | Status: DC
  Administered 2024-08-06 – 2024-08-08 (×5): 5000 [IU] via SUBCUTANEOUS
  Filled 2024-08-06 (×6): qty 1

## 2024-08-06 MED ORDER — LIDOCAINE HCL (PF) 1 % IJ SOLN
INTRAMUSCULAR | Status: AC
  Filled 2024-08-06: qty 30

## 2024-08-06 MED ORDER — HEPARIN (PORCINE) IN NACL 1000-0.9 UT/500ML-% IV SOLN
INTRAVENOUS | Status: DC | PRN
Start: 2024-08-06 — End: 2024-08-06
  Administered 2024-08-06: 1000 mL

## 2024-08-06 MED ORDER — HYDRALAZINE HCL 20 MG/ML IJ SOLN
5.0000 mg | INTRAMUSCULAR | Status: DC | PRN
  Administered 2024-08-06: 5 mg via INTRAVENOUS

## 2024-08-06 MED ORDER — SODIUM CHLORIDE 0.9% FLUSH
3.0000 mL | Freq: Two times a day (BID) | INTRAVENOUS | Status: DC
  Administered 2024-08-06 – 2024-08-08 (×5): 3 mL via INTRAVENOUS

## 2024-08-06 MED ORDER — MIDAZOLAM HCL 2 MG/2ML IJ SOLN
INTRAMUSCULAR | Status: AC
Start: 2024-08-06 — End: 2024-08-06
  Filled 2024-08-06: qty 2

## 2024-08-06 MED ORDER — SODIUM CHLORIDE 0.9 % IV SOLN: INTRAVENOUS | Status: DC

## 2024-08-06 MED ORDER — SODIUM CHLORIDE 0.9 % IV SOLN: 250.0000 mL | INTRAVENOUS | Status: AC | PRN

## 2024-08-06 MED ORDER — ASPIRIN 81 MG PO CHEW
CHEWABLE_TABLET | ORAL | Status: AC
  Filled 2024-08-06: qty 1

## 2024-08-06 MED ORDER — HEPARIN SODIUM (PORCINE) 1000 UNIT/ML IJ SOLN
INTRAMUSCULAR | Status: DC | PRN
Start: 2024-08-06 — End: 2024-08-06
  Administered 2024-08-06: 7000 [IU] via INTRAVENOUS

## 2024-08-06 MED ORDER — ATROPINE SULFATE 1 MG/10ML IJ SOSY
PREFILLED_SYRINGE | INTRAMUSCULAR | Status: AC
  Filled 2024-08-06: qty 10

## 2024-08-06 MED ORDER — MIDAZOLAM HCL (PF) 2 MG/2ML IJ SOLN
INTRAMUSCULAR | Status: DC | PRN
  Administered 2024-08-06: 1 mg via INTRAVENOUS

## 2024-08-06 MED ORDER — HYDRALAZINE HCL 20 MG/ML IJ SOLN
INTRAMUSCULAR | Status: AC
  Filled 2024-08-06: qty 1

## 2024-08-06 MED ORDER — HEPARIN SODIUM (PORCINE) 1000 UNIT/ML IJ SOLN
INTRAMUSCULAR | Status: AC
  Filled 2024-08-06: qty 10

## 2024-08-06 MED ORDER — CLOPIDOGREL BISULFATE 300 MG PO TABS
ORAL_TABLET | ORAL | Status: AC
  Filled 2024-08-06: qty 1

## 2024-08-06 MED ORDER — SODIUM CHLORIDE 0.9% FLUSH: 3.0000 mL | INTRAVENOUS | Status: DC | PRN

## 2024-08-06 MED ORDER — ASPIRIN 81 MG PO CHEW
CHEWABLE_TABLET | ORAL | Status: DC | PRN
  Administered 2024-08-06: 81 mg via ORAL

## 2024-08-06 NOTE — Progress Notes (Signed)
 PROGRESS NOTE    [name]  FMW:[id number] DOB: [birth year] DOA: 08/02/2024 PCP: Larnell Hamilton, MD    Brief Narrative:  This [name] with medical history significant of  HTN, Aortic stenosis, HFrEF, prostate cancer, HLD, TAVR, lumbar laminectomy and hypothyroidism presents to the ED with complaints of longstanding swelling of his left foot with infection.  He is following up with podiatrist and had an MRI of his left foot which is positive for osteomyelitis of the distal third phalanx and cellulitis appears to be progressively getting worse.  Patient has taken multiple courses of oral doxycycline for his cellulitis but shows no significant improvement.  Lower extremity Doppler is negative for DVT.  Patient was directly admitted from podiatry office for osteomyelitis.  Patient has abnormal ABI on the left side.  Vascular surgery is consulted.  Patient is scheduled for angiogram on Monday.  Assessment & Plan:   Principal Problem:   Acute osteomyelitis of left foot (HCC) Active Problems:   Essential hypertension   Hypothyroidism   Hyperlipidemia   History of transcatheter aortic valve replacement (TAVR)   History of lumbar laminectomy   HFrEF (heart failure with reduced ejection fraction) (HCC)   History of prostate cancer   Acute osteomyelitis of the left foot: Critical left lower extremity ischemia: Patient is a direct admit from podiatrist office for management of left foot osteomyelitis. MRI of the left foot showing osteomyelitis of the third distal phalanx. Overlying cutaneous irregularity and thickening. No  loculated fluid collection.  Patient failed outpatient treatment with doxycycline multiple courses. Initiated on  IV antibiotics given patient has a prosthetic heart valve. Initiated on IV vancomycin  and cefepime . Patient has abnormal ABI on the left side.  Vascular surgery is consulted. Patient underwent left lower extremity angiogram  in an effort to improve  distal perfusion to promote wound healing. Patient is hemodynamically stable.  Patient is optimized in the left lower extremity for podiatry intervention. Right lower extremity will possibly require common femoral endarterectomy and anterograde stenting of SFA and intervention of single-vessel peroneal runoff vessel. He is scheduled for left third toe amputation on Wednesday by Dr. Malvin.   Essential hypertension: Continue amlodipine  and losartan .   Hypothyroidism: Continue levothyroxine .   History of TAVR: Patient is on amoxicillin  for chronic prophylaxis.   Currently holding amoxicillin  given treating with vancomycin  and cefepime .   HFrEF Continue amlodipine  and losartan .   History of lumbar laminectomy: Continue adequate pain control.   DVT prophylaxis: Heparin  sq Code Status: Full code Family Communication:Wife at bed side. Disposition Plan: Status is: Inpatient Remains inpatient appropriate because: Admitted for osteomyelitis of the left third phalanx.   Podiatry is consulted.  Vascular is consulted for abnormal ABI.  Status post left lower extremity angiogram. Patient is now well optimized for podiatry intervention.   Consultants:  Podiatry Vascular surgery  Procedures:  Antimicrobials:  Anti-infectives (From admission, onward)    Start     Dose/Rate Route Frequency Ordered Stop   08/03/24 2000  vancomycin  (VANCOREADY) IVPB 1250 mg/250 mL        1,250 mg 166.7 mL/hr over 90 Minutes Intravenous Every 24 hours 08/02/24 2011     08/02/24 2100  ceFEPIme  (MAXIPIME ) 2 g in sodium chloride  0.9 % 100 mL IVPB        2 g 200 mL/hr over 30 Minutes Intravenous Every 12 hours 08/02/24 2005     08/02/24 2100  vancomycin  (VANCOREADY) IVPB 1250 mg/250 mL        1,250 mg  166.7 mL/hr over 90 Minutes Intravenous  Once 08/02/24 2006 08/02/24 2359      Subjective: Patient was seen and examined at bedside.Overnight events noted. Patient is status post left lower extremity  angiogram, well optimized for podiatry intervention.  Objective: Vitals:   08/06/24 1200 08/06/24 1230 08/06/24 1307 08/06/24 1311  BP: (!) 147/67 (!) 159/54 (!) 180/52   Pulse: 65 (!) 57 67   Resp: 18 16 20    Temp:   (!) 97.5 F (36.4 C)   TempSrc:   Oral   SpO2: 97% 97% 100%   Weight:    72.6 kg  Height:    5' 10 (1.778 m)    Intake/Output Summary (Last 24 hours) at 08/06/2024 1344 Last data filed at 08/06/2024 1142 Gross per 24 hour  Intake 715.85 ml  Output 1150 ml  Net -434.15 ml   Filed Weights   08/06/24 1311  Weight: 72.6 kg    Examination:  General exam: Appears calm and comfortable, deconditioned, not in any acute distress.   Respiratory system: CTA Bilaterally. Respiratory effort normal.  RR 15 Cardiovascular system: S1 & S2 heard, RRR. No JVD, murmurs, rubs, gallops or clicks.  Gastrointestinal system: Abdomen is non distended, soft and non tender. Normal bowel sounds heard. Central nervous system: Alert and oriented x 3. No focal neurological deficits. Extremities: No edema, no cyanosis, no clubbing.  Left third toe redness and erythema noted Skin: No rashes, lesions or ulcers Psychiatry: Judgement and insight appear normal. Mood & affect appropriate.     Data Reviewed: I have personally reviewed following labs and imaging studies  CBC: Recent Labs  Lab 08/02/24 2036 08/03/24 0417  WBC 4.8 4.6  HGB 11.1* 11.4*  HCT 33.7* 35.0*  MCV 83.4 83.5  PLT 187 178   Basic Metabolic Panel: Recent Labs  Lab 08/02/24 2036 08/03/24 0417 08/06/24 0523  NA 135 137 139  K 3.6 3.5 4.1  CL 99 100 105  CO2 24 23 23   GLUCOSE 231* 155* 152*  BUN 13 11 13   CREATININE 0.91 0.78 0.82  CALCIUM  8.7* 8.5* 8.8*   GFR: Estimated Creatinine Clearance: 61.5 mL/min (by C-G formula based on SCr of 0.82 mg/dL). Liver Function Tests: Recent Labs  Lab 08/02/24 2036  AST 17  ALT 14  ALKPHOS 72  BILITOT 0.6  PROT 6.2*  ALBUMIN 3.4*   No results for input(s):  LIPASE, AMYLASE in the last 168 hours. No results for input(s): AMMONIA in the last 168 hours. Coagulation Profile: No results for input(s): INR, PROTIME in the last 168 hours. Cardiac Enzymes: No results for input(s): CKTOTAL, CKMB, CKMBINDEX, TROPONINI in the last 168 hours. BNP (last 3 results) No results for input(s): PROBNP in the last 8760 hours. HbA1C: Recent Labs    08/04/24 0822  HGBA1C 8.0*   CBG: Recent Labs  Lab 08/05/24 1115 08/05/24 1654 08/05/24 2110 08/06/24 0616 08/06/24 1240  GLUCAP 145* 150* 201* 154* 144*   Lipid Profile: No results for input(s): CHOL, HDL, LDLCALC, TRIG, CHOLHDL, LDLDIRECT in the last 72 hours. Thyroid  Function Tests: No results for input(s): TSH, T4TOTAL, FREET4, T3FREE, THYROIDAB in the last 72 hours. Anemia Panel: No results for input(s): VITAMINB12, FOLATE, FERRITIN, TIBC, IRON, RETICCTPCT in the last 72 hours. Sepsis Labs: No results for input(s): PROCALCITON, LATICACIDVEN in the last 168 hours.  Recent Results (from the past 240 hours)  Culture, blood (Routine X 2) w Reflex to ID Panel     Status: None (Preliminary result)  Collection Time: 08/02/24  8:30 PM   Specimen: BLOOD  Result Value Ref Range Status   Specimen Description BLOOD SITE NOT SPECIFIED  Final   Special Requests   Final    BOTTLES DRAWN AEROBIC AND ANAEROBIC Blood Culture adequate volume   Culture   Final    NO GROWTH 4 DAYS Performed at [facility], [address] [place], [place] [address]    Report Status PENDING  Incomplete  Culture, blood (Routine X 2) w Reflex to ID Panel     Status: None (Preliminary result)   Collection Time: 08/02/24  8:36 PM   Specimen: BLOOD  Result Value Ref Range Status   Specimen Description BLOOD SITE NOT SPECIFIED  Final   Special Requests   Final    BOTTLES DRAWN AEROBIC AND ANAEROBIC Blood Culture adequate volume   Culture   Final    NO GROWTH 4  DAYS Performed at [facility], [address], [place], [place] [address]    Report Status PENDING  Incomplete    Radiology Studies: PERIPHERAL VASCULAR CATHETERIZATION Result Date: 08/06/2024 Patient name: Omar Haley MRN: [id number] DOB: July 06, 1931 Sex: male 08/06/2024 Pre-operative Diagnosis: Atherosclerosis native arteries with left middle toe ulceration Post-operative diagnosis:  Same Surgeon:  Penne BROCKS. Sheree, MD Procedure Performed: 1.  Percutaneous ultrasound-guided cannulation right common femoral artery 2.  Catheter selection of aorta and aortogram with bilateral lower extremity angiography 3.  Catheter selection left posterior tibial artery 4.  Stent left SFA with 6 x 150 mm Eluvia and 6 x 120 mm Eluvia postdilated with 5 mm Mustang 5.  Moderate sedation with fentanyl  and Versed  for 53 minutes Indications: 89 year old male with history of TAVR now with left middle toe ulceration has been evaluated by podiatry.  He has depressed ABIs bilaterally and is indicated for angiography with possible invention of the left lower extremity. Findings: The aorta is patent however distal aorta has areas which appear to be soft plaque but is nonflow limiting.  The bilateral renal arteries are patent however the right side has approximately 70% stenosis.  Bilateral hypogastric arteries are patent.  On the left side the external iliac artery leading into the common femoral artery is patent and the profunda and SFA are widely patent proximally.  The SFA in the midsegment becomes long segment stenosis up to 95%.  Distally he has single-vessel runoff via a very large peroneal artery.  Posterior tibial artery is initially patent and then reconstitutes at the foot level after occlusion at the ankle.  I did attempt to cross the occlusion in the distal leg however I was unable to and patient has single-vessel runoff via the peroneal artery remaining.  After stenting he has less than 10% residual stenosis  throughout the SFA.  On the right side the common femoral artery appears to be subtotally occluded with known previous access.  The SFA is diseased however only 50% stenosis.  Distally he has only peroneal artery runoff with approximately 90% stenosis in the midsegment. Patient is optimized in the left lower extremity for podiatry intervention.  Right lower extremity will possibly require common femoral endarterectomy and antegrade stenting of the SFA and intervention of the single-vessel peroneal runoff vessel.  Procedure:  The patient was identified in the holding area and taken to room 8.  The patient was then placed supine on the table and prepped and draped in the usual sterile fashion.  A time out was called.  Ultrasound was used to evaluate  the right common femoral artery.  This was heavily diseased there was only a pulse at the most cephalad aspect of this artery.  The area was anesthetized 1% lidocaine  and cannulated with a micropuncture needle followed by wire and sheath.  Ultrasound images saved to the permanent record.  Concomitantly we administered fentanyl  and Versed  as moderate sedation his vital signs were monitored throughout the case.  We placed the Bentson wire and fluoroscopic guidance followed by 5 French sheath.  An Omni catheter was placed to the level of L1 we performed aortogram and pulled to the bifurcation with catheter performed pelvic angiography.  The bifurcation was then crossed with Omni cath and Bentson wire left lower extremity angiography was performed from the left common femoral artery.  With the above findings we placed the [facility] followed by a long 6 French sheath and the patient was fully heparinized.  Using a quick cross and Glidewire advantage we were able to traverse the subtotally occluded SFA and confirmed intraluminal access to the popliteal artery.  I then used a CXI catheter and combination of V18 and command wires was able to get to the level of the ankle but  could not cross the occluded posterior tibial artery.  With this I exchanged back for the Glidewire advantage and primarily stented the SFA with 2 drug-eluting stents postdilated with 5 mm balloon.  Completion demonstrated only minimal residual stenosis measuring less than 10% throughout the SFA with brisk flow distally.  Satisfied with this we exchanged for a short 6 French sheath in the right groin and perform right lower extremity angiography with the above findings.  Sheath will be pulled in postoperative holding.  He tolerated the procedure without immediate complication.  There was a very strong peroneal artery signal at the ankle completion. Contrast: 95cc Brandon C. Sheree, MD [facility] [place] Office: [phone] Pager: [phone]   Scheduled Meds:  amLODipine   10 mg Oral Daily   aspirin  EC  81 mg Oral Daily   docusate sodium   100 mg Oral BID   heparin   5,000 Units Subcutaneous Q8H   hydrALAZINE        insulin  aspart  0-9 Units Subcutaneous TID WC   levothyroxine   50 mcg Oral Q0600   losartan   25 mg Oral Daily   sodium chloride  flush  3 mL Intravenous Q12H   sodium chloride  flush  3 mL Intravenous Q12H   Continuous Infusions:  sodium chloride  Stopped (08/06/24 1237)   sodium chloride  40 mL/hr at 08/06/24 1237   ceFEPime  (MAXIPIME ) IV 2 g (08/05/24 2212)   vancomycin  1,250 mg (08/05/24 2018)     LOS: 4 days    Time spent: 35 mins    Darcel Dawley, MD [facility]   If 7PM-7AM, please contact night-coverage

## 2024-08-06 NOTE — Progress Notes (Signed)
  Daily Progress Note    Subjective: Complaints, awake alert  Objective: Vitals:   08/05/24 1954 08/06/24 0349  BP: (!) 145/60 (!) 138/45  Pulse: 61 (!) 52  Resp: 16   Temp: 98 F (36.7 C) 97.9 F (36.6 C)  SpO2: 96% 98%    Physical Examination Awake, alert, nonlabored breathing, regular rate Left great toe osteo No drainage  ASSESSMENT/PLAN:  Patient is a 89 year old male with left toe osteomyelitis in need of angiogram with possible intervention for critical ischemia with tissue loss.  After discussing the risks and benefits, Omar Haley elected to proceed.   Omar FORBES Rim MD MS [facility] [phone] 08/06/2024  7:34 AM

## 2024-08-06 NOTE — Op Note (Signed)
 Patient name: Omar Haley MRN: [id number] DOB: 16-Dec-1930 Sex: male  08/06/2024 Pre-operative Diagnosis: Atherosclerosis native arteries with left middle toe ulceration Post-operative diagnosis:  Same Surgeon:  Penne BROCKS. Sheree, MD Procedure Performed: 1.  Percutaneous ultrasound-guided cannulation right common femoral artery 2.  Catheter selection of aorta and aortogram with bilateral lower extremity angiography 3.  Catheter selection left posterior tibial artery 4.  Stent left SFA with 6 x 150 mm Eluvia and 6 x 120 mm Eluvia postdilated with 5 mm Mustang 5.  Moderate sedation with fentanyl  and Versed  for 53 minutes  Indications: 89 year old male with history of TAVR now with left middle toe ulceration has been evaluated by podiatry.  He has depressed ABIs bilaterally and is indicated for angiography with possible invention of the left lower extremity.  Findings: The aorta is patent however distal aorta has areas which appear to be soft plaque but is nonflow limiting.  The bilateral renal arteries are patent however the right side has approximately 70% stenosis.  Bilateral hypogastric arteries are patent.  On the left side the external iliac artery leading into the common femoral artery is patent and the profunda and SFA are widely patent proximally.  The SFA in the midsegment becomes long segment stenosis up to 95%.  Distally he has single-vessel runoff via a very large peroneal artery.  Posterior tibial artery is initially patent and then reconstitutes at the foot level after occlusion at the ankle.  I did attempt to cross the occlusion in the distal leg however I was unable to and patient has single-vessel runoff via the peroneal artery remaining.  After stenting he has less than 10% residual stenosis throughout the SFA.  On the right side the common femoral artery appears to be subtotally occluded with known previous access.  The SFA is diseased however only 50% stenosis.  Distally he has  only peroneal artery runoff with approximately 90% stenosis in the midsegment.  Patient is optimized in the left lower extremity for podiatry intervention.  Right lower extremity will possibly require common femoral endarterectomy and antegrade stenting of the SFA and intervention of the single-vessel peroneal runoff vessel.   Procedure:  The patient was identified in the holding area and taken to room 8.  The patient was then placed supine on the table and prepped and draped in the usual sterile fashion.  A time out was called.  Ultrasound was used to evaluate the right common femoral artery.  This was heavily diseased there was only a pulse at the most cephalad aspect of this artery.  The area was anesthetized 1% lidocaine  and cannulated with a micropuncture needle followed by wire and sheath.  Ultrasound images saved to the permanent record.  Concomitantly we administered fentanyl  and Versed  as moderate sedation his vital signs were monitored throughout the case.  We placed the Bentson wire and fluoroscopic guidance followed by 5 French sheath.  An Omni catheter was placed to the level of L1 we performed aortogram and pulled to the bifurcation with catheter performed pelvic angiography.  The bifurcation was then crossed with Omni cath and Bentson wire left lower extremity angiography was performed from the left common femoral artery.  With the above findings we placed the [facility] followed by a long 6 French sheath and the patient was fully heparinized.  Using a quick cross and Glidewire advantage we were able to traverse the subtotally occluded SFA and confirmed intraluminal access to the popliteal artery.  I then used a CXI catheter  and combination of V18 and command wires was able to get to the level of the ankle but could not cross the occluded posterior tibial artery.  With this I exchanged back for the Glidewire advantage and primarily stented the SFA with 2 drug-eluting stents postdilated with 5  mm balloon.  Completion demonstrated only minimal residual stenosis measuring less than 10% throughout the SFA with brisk flow distally.  Satisfied with this we exchanged for a short 6 French sheath in the right groin and perform right lower extremity angiography with the above findings.  Sheath will be pulled in postoperative holding.  He tolerated the procedure without immediate complication.  There was a very strong peroneal artery signal at the ankle completion.  Contrast: 95cc  Alexyss Balzarini C. Sheree, MD [facility] Office: [phone] Pager: [phone]

## 2024-08-06 NOTE — Progress Notes (Signed)
 Patient arrived on the floor from cath lab. Alert and oriented X4. IV's flushed, CCMD notified, and pt assessed. In no apparent distress. Right femoral site clean, dry and intact. No hematoma noted. Pt educated on bedrest/laying flat until 1400. Wife at bedside. Call light within reach, and bed alarm set.

## 2024-08-06 NOTE — Interval H&P Note (Signed)
 History and Physical Interval Note:  08/06/2024 7:27 AM  Omar Haley  has presented today for surgery, with the diagnosis of RLE tissue loss.  The various methods of treatment have been discussed with the patient and family. After consideration of risks, benefits and other options for treatment, the patient has consented to  Procedure(s): ABDOMINAL AORTOGRAM W/LOWER EXTREMITY (N/A) as a surgical intervention.  The patient's history has been reviewed, patient examined, no change in status, stable for surgery.  I have reviewed the patient's chart and labs.  Questions were answered to the patient's satisfaction.     Penne [place]

## 2024-08-06 NOTE — Progress Notes (Signed)
 6 Fr. Sheath removed at 0925 and pressure held until 1000. Pt had small hematoma at start of hold which was managed and is now resolved. Pt did had vagal like episode dropping his pressure and losing color as well as c/o nausea. Pt was placed in trendelenburg position and 250cc NS bolus given. Pt quickly resolved symptoms of vagal and had no c/o for remainder of hold. Site is level 0 and soft, non-tender. Pt vitals are stable. Dr. Sheree aware. Will continue to monitor for ready bed and pt site and status.

## 2024-08-07 ENCOUNTER — Encounter (HOSPITAL_COMMUNITY): Payer: Self-pay | Admitting: Vascular Surgery

## 2024-08-07 DIAGNOSIS — M86172 Other acute osteomyelitis, left ankle and foot: Secondary | ICD-10-CM | POA: Diagnosis not present

## 2024-08-07 LAB — BASIC METABOLIC PANEL WITH GFR
Anion gap: 10 (ref 5–15)
BUN: 8 mg/dL (ref 8–23)
CO2: 20 mmol/L — ABNORMAL LOW (ref 22–32)
Calcium: 8.6 mg/dL — ABNORMAL LOW (ref 8.9–10.3)
Chloride: 106 mmol/L (ref 98–111)
Creatinine, Ser: 0.71 mg/dL (ref 0.61–1.24)
GFR, Estimated: >60 mL/min (ref >60)
Glucose, Bld: 153 mg/dL — ABNORMAL HIGH (ref 70–99)
Potassium: 3.7 mmol/L (ref 3.5–5.1)
Sodium: 136 mmol/L (ref 135–145)

## 2024-08-07 LAB — GLUCOSE, CAPILLARY
Glucose-Capillary: 184 mg/dL — ABNORMAL HIGH (ref 70–99)
Glucose-Capillary: 149 mg/dL — ABNORMAL HIGH (ref 70–99)
Glucose-Capillary: 165 mg/dL — ABNORMAL HIGH (ref 70–99)
Glucose-Capillary: 167 mg/dL — ABNORMAL HIGH (ref 70–99)

## 2024-08-07 LAB — LIPID PANEL
Cholesterol: 184 mg/dL (ref 0–200)
HDL: 39 mg/dL — ABNORMAL LOW (ref >40)
LDL Cholesterol: 89 mg/dL (ref 0–99)
Total CHOL/HDL Ratio: 4.7 ratio
Triglycerides: 279 mg/dL — ABNORMAL HIGH (ref <150)
VLDL: 56 mg/dL — ABNORMAL HIGH (ref 0–40)

## 2024-08-07 LAB — VANCOMYCIN, TROUGH: Vancomycin Tr: 15 ug/mL (ref 15–20)

## 2024-08-07 LAB — CULTURE, BLOOD (ROUTINE X 2)
Culture: NO GROWTH
Culture: NO GROWTH
Special Requests: ADEQUATE
Special Requests: ADEQUATE

## 2024-08-07 LAB — CBC
HCT: 34.3 % — ABNORMAL LOW (ref 39.0–52.0)
Hemoglobin: 11 g/dL — ABNORMAL LOW (ref 13.0–17.0)
MCH: 27.2 pg (ref 26.0–34.0)
MCHC: 32.1 g/dL (ref 30.0–36.0)
MCV: 84.9 fL (ref 80.0–100.0)
Platelets: 177 K/uL (ref 150–400)
RBC: 4.04 MIL/uL — ABNORMAL LOW (ref 4.22–5.81)
RDW: 15.4 % (ref 11.5–15.5)
WBC: 6.2 K/uL (ref 4.0–10.5)
nRBC: 0 % (ref 0.0–0.2)

## 2024-08-07 MED ORDER — VANCOMYCIN HCL 1500 MG/300ML IV SOLN
1500.0000 mg | INTRAVENOUS | Status: DC
  Administered 2024-08-07: 1500 mg via INTRAVENOUS
  Filled 2024-08-07 (×2): qty 300

## 2024-08-07 MED ORDER — ROSUVASTATIN CALCIUM 5 MG PO TABS
10.0000 mg | ORAL_TABLET | Freq: Every day | ORAL | Status: DC
  Administered 2024-08-07 – 2024-08-08 (×2): 10 mg via ORAL
  Filled 2024-08-07 (×2): qty 2

## 2024-08-07 NOTE — Progress Notes (Addendum)
  Progress Note    08/07/2024 6:43 AM 1 Day Post-Op  Subjective:  sitting on side of bed.  Denies pain.  afebrile  Vitals:   08/07/24 0321 08/07/24 0333  BP: (!) 171/54 (!) 151/56  Pulse: 66 70  Resp: 20 18  Temp: 98.5 F (36.9 C)   SpO2: 99% 98%    Physical Exam: General:  no distress Cardiac:  regular Lungs:  non labored Incisions:  right groin is soft Extremities:  brisk doppler flow left DP and peroneal.  Left calf soft.    CBC    Component Value Date/Time   WBC 6.2 08/07/2024 0358   RBC 4.04 (L) 08/07/2024 0358   HGB 11.0 (L) 08/07/2024 0358   HGB 12.6 (L) 12/21/2022 1021   HCT 34.3 (L) 08/07/2024 0358   HCT 38.5 12/21/2022 1021   PLT 177 08/07/2024 0358   PLT 161 12/21/2022 1021   MCV 84.9 08/07/2024 0358   MCV 88 12/21/2022 1021   MCH 27.2 08/07/2024 0358   MCHC 32.1 08/07/2024 0358   RDW 15.4 08/07/2024 0358   RDW 13.7 12/21/2022 1021   LYMPHSABS 1.1 04/28/2011 1440   MONOABS 0.6 04/28/2011 1440   EOSABS 0.1 04/28/2011 1440   BASOSABS 0.0 04/28/2011 1440    BMET    Component Value Date/Time   NA 136 08/07/2024 0358   NA 138 12/21/2022 1021   K 3.7 08/07/2024 0358   CL 106 08/07/2024 0358   CO2 20 (L) 08/07/2024 0358   GLUCOSE 153 (H) 08/07/2024 0358   BUN 8 08/07/2024 0358   BUN 13 12/21/2022 1021   CREATININE 0.71 08/07/2024 0358   CALCIUM  8.6 (L) 08/07/2024 0358   GFRNONAA >60 08/07/2024 0358   GFRAA >60 04/28/2011 1440    INR    Component Value Date/Time   INR 1.0 11/15/2023 0920     Intake/Output Summary (Last 24 hours) at 08/07/2024 0643 Last data filed at 08/07/2024 0551 Gross per 24 hour  Intake 853.68 ml  Output 1350 ml  Net -496.32 ml      Assessment/Plan:  89 y.o. male is s/p:  Angiogram via right CFA with stent left SFA 08/06/2024 by Dr. Sheree for left middle toe ulcer 1 Day Post-Op   -pt with brisk doppler flow left DP/pero.  Per Dr. Claretta op note, Patient is optimized in the left lower extremity for  podiatry intervention. Right lower extremity will possibly require common femoral endarterectomy and antegrade stenting of the SFA and intervention of the single-vessel peroneal runoff vessel.  -f/u in 4-6 weeks with LLE arterial duplex and ABI.  Our office will arrange appt.   Continue asa/plavix .    Pt is not on statin-would benefit if not contraindicated.     Lucie Apt, PA-C [facility] [phone] 08/07/2024 6:43 AM  I have independently interviewed and examined patient and agree with PA assessment and plan above.  Multiphasic left peroneal signal on exam today.  He does have significant right lower extremity arterial disease but thankfully asymptomatic at this time and will need to protect his feet diligently.  Podiatry planning left third toe amputation tomorrow should have flow for healing.  He will follow-up in our office in 4 to 6 weeks with noninvasive studies.  Guillermina Shaft C. Sheree, MD [facility] [place] Office: [phone] Pager: [phone]

## 2024-08-07 NOTE — Progress Notes (Signed)
   PODIATRY PROGRESS NOTE Patient Name: Omar Haley  DOB 08-05-1931 DOA 08/02/2024  Hospital Day: 6  Assessment:  89 y.o. male with PMHx significant for  HTN, aortic stenosis, HFrEF, prostate cancer, HLD, TAVR, lumbar laminectomy and hypothyroidism  with osteomyelitis of left third toe distal phalanx.   Plan:  - N.p.o. past midnight for OR tomorrow morning for left third toe partial amputation.  Patient agrees to proceed - Appreciate vascular surgery, status post left lower extremity angiogram with intervention - Continue IV abx broad spectrum pending further culture data - Anticoagulation: ok to continue per primary - Wound care: none needed at this time - WB status: WBAT in post op shoe following surgery  - Will continue to follow        Marolyn JULIANNA Honour, DPM [facility]    Subjective:  Discussed plans for amputation tomorrow morning he is in agreement with plans.  Discussed follow-up and aftercare instructions will  Objective:   Vitals:   08/07/24 0733 08/07/24 1121  BP: (!) 166/50 (!) 157/57  Pulse: 66 66  Resp: 17 18  Temp: 97.7 F (36.5 C) (!) 97.5 F (36.4 C)  SpO2: 100% 100%       Latest Ref Rng & Units 08/07/2024    3:58 AM 08/06/2024    5:23 AM 08/03/2024    4:17 AM  CBC  WBC 4.0 - 10.5 K/uL 6.2  4.7  4.6   Hemoglobin 13.0 - 17.0 g/dL 88.9  88.4  88.5   Hematocrit 39.0 - 52.0 % 34.3  35.9  35.0   Platelets 150 - 400 K/uL 177  177  178        Latest Ref Rng & Units 08/07/2024    3:58 AM 08/06/2024    5:23 AM 08/03/2024    4:17 AM  BMP  Glucose 70 - 99 mg/dL 846  847  844   BUN 8 - 23 mg/dL 8  13  11    Creatinine 0.61 - 1.24 mg/dL 9.28  9.17  9.21   Sodium 135 - 145 mmol/L 136  139  137   Potassium 3.5 - 5.1 mmol/L 3.7  4.1  3.5   Chloride 98 - 111 mmol/L 106  105  100   CO2 22 - 32 mmol/L 20  23  23    Calcium  8.9 - 10.3 mg/dL 8.6  8.8  8.5     General: AAOx3, NAD  Lower Extremity Exam DP and PT pulses non palpable left  foot   Ulceration distlal plantar aspect of left third toe with erythema and edema distally   Sensation diminished to light touch   Edema of left ankle     Radiology:  Results reviewed. See assessment for pertinent imaging results

## 2024-08-07 NOTE — Progress Notes (Signed)
 PROGRESS NOTE    Omar Haley  FMW:[id number] DOB: Dec 12, 1930 DOA: 08/02/2024 PCP: Larnell Hamilton, MD    Brief Narrative:  This [name] with medical history significant of  HTN, Aortic stenosis, HFrEF, prostate cancer, HLD, TAVR, lumbar laminectomy and hypothyroidism presents to the ED with complaints of longstanding swelling of his left foot with infection.  He is following up with podiatrist and had an MRI of his left foot which is positive for osteomyelitis of the distal third phalanx and cellulitis appears to be progressively getting worse.  Patient has taken multiple courses of oral doxycycline for his cellulitis but shows no significant improvement.  Lower extremity Doppler is negative for DVT.  Patient was directly admitted from podiatry office for osteomyelitis.  Patient has abnormal ABI on the left side.  Vascular surgery is consulted.  Patient is status post angiogram.  Patient is now optimized in the left lower extremity for podiatry intervention. Patient is scheduled for partial left third toe amputation tomorrow.   Assessment & Plan:   Principal Problem:   Acute osteomyelitis of left foot (HCC) Active Problems:   Essential hypertension   Hypothyroidism   Hyperlipidemia   History of transcatheter aortic valve replacement (TAVR)   History of lumbar laminectomy   HFrEF (heart failure with reduced ejection fraction) (HCC)   History of prostate cancer   Acute osteomyelitis of the left foot: Critical left lower extremity ischemia: Patient is a direct admit from podiatrist office for management of left foot osteomyelitis. MRI of the left foot showing osteomyelitis of the third distal phalanx. Overlying cutaneous irregularity and thickening. No loculated fluid collection.  Patient failed outpatient treatment with doxycycline multiple courses. Initiated on  IV antibiotics given patient has a prosthetic heart valve. Initiated on IV vancomycin  and cefepime . Patient has  abnormal ABI on the left side.  Vascular surgery is consulted. Patient underwent left lower extremity angiogram in an effort to improve distal perfusion to promote wound healing. Patient is hemodynamically stable.  Patient is well optimized in the left lower extremity for podiatry intervention. Right lower extremity will possibly require common femoral endarterectomy and anterograde stenting of SFA and intervention of single-vessel peroneal runoff vessel.  Follow-up in office in 4 to 6 weeks for noninvasive studies. He is scheduled for left third toe partial amputation on Wednesday by Dr. Malvin.   Essential hypertension: Continue amlodipine  and losartan .   Hypothyroidism: Continue levothyroxine .   History of TAVR: Patient is on amoxicillin  for chronic prophylaxis.   Currently holding amoxicillin  given treating with vancomycin  and cefepime .   HFpEF: Continue aspirin , losartan .   History of lumbar laminectomy: Continue adequate pain control.   DVT prophylaxis: Heparin  sq Code Status: Full code Family Communication: Wife at bed side. Disposition Plan: Status is: Inpatient Remains inpatient appropriate because: Admitted for osteomyelitis of the left third phalanx.   Podiatry is consulted.  Vascular is consulted for abnormal ABI.  Status post left lower extremity angiogram. Patient is now well optimized for podiatry intervention.  Patient is scheduled for partial left third toe amputation on wednesday   Consultants:  Podiatry Vascular surgery  Procedures:  Antimicrobials:  Anti-infectives (From admission, onward)    Start     Dose/Rate Route Frequency Ordered Stop   08/03/24 2000  vancomycin  (VANCOREADY) IVPB 1250 mg/250 mL        1,250 mg 166.7 mL/hr over 90 Minutes Intravenous Every 24 hours 08/02/24 2011     08/02/24 2100  ceFEPIme  (MAXIPIME ) 2 g in sodium chloride  0.9 %  100 mL IVPB        2 g 200 mL/hr over 30 Minutes Intravenous Every 12 hours 08/02/24 2005      08/02/24 2100  vancomycin  (VANCOREADY) IVPB 1250 mg/250 mL        1,250 mg 166.7 mL/hr over 90 Minutes Intravenous  Once 08/02/24 2006 08/02/24 2359      Subjective: Patient was seen and examined at bedside.Overnight events noted. Patient reports feeling better.  Patient is scheduled for partial left third toe amputation tomorrow  Objective: Vitals:   08/07/24 0321 08/07/24 0333 08/07/24 0733 08/07/24 1121  BP: (!) 171/54 (!) 151/56 (!) 166/50 (!) 157/57  Pulse: 66 70 66 66  Resp: 20 18 17 18   Temp: 98.5 F (36.9 C)  97.7 F (36.5 C) (!) 97.5 F (36.4 C)  TempSrc: Oral  Oral Oral  SpO2: 99% 98% 100% 100%  Weight:      Height:        Intake/Output Summary (Last 24 hours) at 08/07/2024 1238 Last data filed at 08/07/2024 0700 Gross per 24 hour  Intake 1853.68 ml  Output 950 ml  Net 903.68 ml   Filed Weights   08/06/24 1311  Weight: 72.6 kg    Examination:  General exam: Appears calm and comfortable, deconditioned, not in any acute distress.   Respiratory system: CTA Bilaterally. Respiratory effort normal.  RR 15 Cardiovascular system: S1 & S2 heard, RRR. No JVD, murmurs, rubs, gallops or clicks.  Gastrointestinal system: Abdomen is non distended, soft and non tender. Normal bowel sounds heard. Central nervous system: Alert and oriented x 3. No focal neurological deficits. Extremities: No edema, no cyanosis, no clubbing.  Left third toe redness and erythema noted Skin: No rashes, lesions or ulcers Psychiatry: Judgement and insight appear normal. Mood & affect appropriate.     Data Reviewed: I have personally reviewed following labs and imaging studies  CBC: Recent Labs  Lab 08/02/24 2036 08/03/24 0417 08/06/24 0523 08/07/24 0358  WBC 4.8 4.6 4.7 6.2  HGB 11.1* 11.4* 11.5* 11.0*  HCT 33.7* 35.0* 35.9* 34.3*  MCV 83.4 83.5 84.7 84.9  PLT 187 178 177 177   Basic Metabolic Panel: Recent Labs  Lab 08/02/24 2036 08/03/24 0417 08/06/24 0523 08/07/24 0358   NA 135 137 139 136  K 3.6 3.5 4.1 3.7  CL 99 100 105 106  CO2 24 23 23  20*  GLUCOSE 231* 155* 152* 153*  BUN 13 11 13 8   CREATININE 0.91 0.78 0.82 0.71  CALCIUM  8.7* 8.5* 8.8* 8.6*   GFR: Estimated Creatinine Clearance: 63 mL/min (by C-G formula based on SCr of 0.71 mg/dL). Liver Function Tests: Recent Labs  Lab 08/02/24 2036  AST 17  ALT 14  ALKPHOS 72  BILITOT 0.6  PROT 6.2*  ALBUMIN 3.4*   No results for input(s): LIPASE, AMYLASE in the last 168 hours. No results for input(s): AMMONIA in the last 168 hours. Coagulation Profile: No results for input(s): INR, PROTIME in the last 168 hours. Cardiac Enzymes: No results for input(s): CKTOTAL, CKMB, CKMBINDEX, TROPONINI in the last 168 hours. BNP (last 3 results) No results for input(s): PROBNP in the last 8760 hours. HbA1C: No results for input(s): HGBA1C in the last 72 hours.  CBG: Recent Labs  Lab 08/06/24 1240 08/06/24 1724 08/06/24 2104 08/07/24 0609 08/07/24 1118  GLUCAP 144* 195* 166* 184* 149*   Lipid Profile: Recent Labs    08/07/24 0358  CHOL 184  HDL 39*  LDLCALC 89  TRIG 720*  CHOLHDL 4.7   Thyroid  Function Tests: No results for input(s): TSH, T4TOTAL, FREET4, T3FREE, THYROIDAB in the last 72 hours. Anemia Panel: No results for input(s): VITAMINB12, FOLATE, FERRITIN, TIBC, IRON, RETICCTPCT in the last 72 hours. Sepsis Labs: No results for input(s): PROCALCITON, LATICACIDVEN in the last 168 hours.  Recent Results (from the past 240 hours)  Culture, blood (Routine X 2) w Reflex to ID Panel     Status: None   Collection Time: 08/02/24  8:30 PM   Specimen: BLOOD  Result Value Ref Range Status   Specimen Description BLOOD SITE NOT SPECIFIED  Final   Special Requests   Final    BOTTLES DRAWN AEROBIC AND ANAEROBIC Blood Culture adequate volume   Culture   Final    NO GROWTH 5 DAYS Performed at [facility], [address], [place],  [place] [address]    Report Status 08/07/2024 FINAL  Final  Culture, blood (Routine X 2) w Reflex to ID Panel     Status: None   Collection Time: 08/02/24  8:36 PM   Specimen: BLOOD  Result Value Ref Range Status   Specimen Description BLOOD SITE NOT SPECIFIED  Final   Special Requests   Final    BOTTLES DRAWN AEROBIC AND ANAEROBIC Blood Culture adequate volume   Culture   Final    NO GROWTH 5 DAYS Performed at [facility], [address], [place], [place] [address]    Report Status 08/07/2024 FINAL  Final    Radiology Studies: PERIPHERAL VASCULAR CATHETERIZATION Result Date: 08/06/2024 Patient name: Omar Haley MRN: [id number] DOB: May 19, 1931 Sex: male 08/06/2024 Pre-operative Diagnosis: Atherosclerosis native arteries with left middle toe ulceration Post-operative diagnosis:  Same Surgeon:  Penne BROCKS. Sheree, MD Procedure Performed: 1.  Percutaneous ultrasound-guided cannulation right common femoral artery 2.  Catheter selection of aorta and aortogram with bilateral lower extremity angiography 3.  Catheter selection left posterior tibial artery 4.  Stent left SFA with 6 x 150 mm Eluvia and 6 x 120 mm Eluvia postdilated with 5 mm Mustang 5.  Moderate sedation with fentanyl  and Versed  for 53 minutes Indications: 89 year old male with history of TAVR now with left middle toe ulceration has been evaluated by podiatry.  He has depressed ABIs bilaterally and is indicated for angiography with possible invention of the left lower extremity. Findings: The aorta is patent however distal aorta has areas which appear to be soft plaque but is nonflow limiting.  The bilateral renal arteries are patent however the right side has approximately 70% stenosis.  Bilateral hypogastric arteries are patent.  On the left side the external iliac artery leading into the common femoral artery is patent and the profunda and SFA are widely patent proximally.  The SFA in the midsegment becomes long segment stenosis up to 95%.   Distally he has single-vessel runoff via a very large peroneal artery.  Posterior tibial artery is initially patent and then reconstitutes at the foot level after occlusion at the ankle.  I did attempt to cross the occlusion in the distal leg however I was unable to and patient has single-vessel runoff via the peroneal artery remaining.  After stenting he has less than 10% residual stenosis throughout the SFA.  On the right side the common femoral artery appears to be subtotally occluded with known previous access.  The SFA is diseased however only 50% stenosis.  Distally he has only peroneal artery runoff with approximately 90% stenosis in the midsegment. Patient is optimized in  the left lower extremity for podiatry intervention.  Right lower extremity will possibly require common femoral endarterectomy and antegrade stenting of the SFA and intervention of the single-vessel peroneal runoff vessel.  Procedure:  The patient was identified in the holding area and taken to room 8.  The patient was then placed supine on the table and prepped and draped in the usual sterile fashion.  A time out was called.  Ultrasound was used to evaluate the right common femoral artery.  This was heavily diseased there was only a pulse at the most cephalad aspect of this artery.  The area was anesthetized 1% lidocaine  and cannulated with a micropuncture needle followed by wire and sheath.  Ultrasound images saved to the permanent record.  Concomitantly we administered fentanyl  and Versed  as moderate sedation his vital signs were monitored throughout the case.  We placed the Bentson wire and fluoroscopic guidance followed by 5 French sheath.  An Omni catheter was placed to the level of L1 we performed aortogram and pulled to the bifurcation with catheter performed pelvic angiography.  The bifurcation was then crossed with Omni cath and Bentson wire left lower extremity angiography was performed from the left common femoral artery.  With  the above findings we placed the [facility] followed by a long 6 French sheath and the patient was fully heparinized.  Using a quick cross and Glidewire advantage we were able to traverse the subtotally occluded SFA and confirmed intraluminal access to the popliteal artery.  I then used a CXI catheter and combination of V18 and command wires was able to get to the level of the ankle but could not cross the occluded posterior tibial artery.  With this I exchanged back for the Glidewire advantage and primarily stented the SFA with 2 drug-eluting stents postdilated with 5 mm balloon.  Completion demonstrated only minimal residual stenosis measuring less than 10% throughout the SFA with brisk flow distally.  Satisfied with this we exchanged for a short 6 French sheath in the right groin and perform right lower extremity angiography with the above findings.  Sheath will be pulled in postoperative holding.  He tolerated the procedure without immediate complication.  There was a very strong peroneal artery signal at the ankle completion. Contrast: 95cc Brandon C. Sheree, MD [facility] Office: [phone] Pager: [phone]   Scheduled Meds:  amLODipine   10 mg Oral Daily   aspirin  EC  81 mg Oral Daily   clopidogrel   75 mg Oral Q breakfast   docusate sodium   100 mg Oral BID   heparin   5,000 Units Subcutaneous Q8H   insulin  aspart  0-9 Units Subcutaneous TID WC   levothyroxine   50 mcg Oral Q0600   losartan   25 mg Oral Daily   rosuvastatin   10 mg Oral Daily   sodium chloride  flush  3 mL Intravenous Q12H   sodium chloride  flush  3 mL Intravenous Q12H   sodium chloride  flush  3 mL Intravenous Q12H   Continuous Infusions:  sodium chloride  100 mL/hr at 08/07/24 0700   sodium chloride      ceFEPime  (MAXIPIME ) IV 2 g (08/07/24 0853)   vancomycin  1,250 mg (08/06/24 1959)     LOS: 5 days    Time spent: 35 mins    Darcel Dawley, MD [facility]   If 7PM-7AM,  please contact night-coverage

## 2024-08-07 NOTE — Progress Notes (Signed)
 Pharmacy Antibiotic Note  Alvar Malinoski is a 89 y.o. male on day #5 Vancomycin  and Cefepime  for osteomyelitis of left third toe distal phalanx.  For partial amputation of left 3rd toe on 08/08/24.  Currently on Vancomycin  1250 mg IV q24h at 8pm.  Vancomycin  peak level done after last night's dose, and random vanc level done this am. Calculated AUC is 380, just below goal 400-550.  Plan: Increase Vancomycin  to 1500 mg IV q24h for eAUC 455. Continue Cefepime  2gm IV q12hrs Follow renal function, and post-op antibiotic plan.  Height: 5' 10 (177.8 cm) Weight: 72.6 kg (160 lb) IBW/kg (Calculated) : 73  Temp (24hrs), Avg:98.1 F (36.7 C), Min:97.5 F (36.4 C), Max:98.5 F (36.9 C)  Recent Labs  Lab 08/02/24 2036 08/03/24 0417 08/06/24 0523 08/06/24 2238 08/07/24 0358 08/07/24 0712  WBC 4.8 4.6 4.7  --  6.2  --   CREATININE 0.91 0.78 0.82  --  0.71  --   VANCOTROUGH  --   --   --   --   --  15  VANCOPEAK  --   --   --  33  --   --     Estimated Creatinine Clearance: 63 mL/min (by C-G formula based on SCr of 0.71 mg/dL).    Allergies  Allergen Reactions   Aldactone  [Spironolactone ] Itching    Scalp itching   Lopid [Gemfibrozil] Other (See Comments)    Unknown reaction   Micardis [Telmisartan] Diarrhea   Vasotec [Enalapril] Cough   Zetia [Ezetimibe] Other (See Comments)    Laryngitis    Coreg  [Carvedilol ] Rash   Glucophage [Metformin] Rash    Antimicrobials this admission: Vancomycin  11/20 pm >> Cefepime  11/20 pm >>  Dose adjustments this admission: 11/24 Vanc peak 33 (~2 hrs after 1250 mg dose infused over 1.5 hours 11/25 Vanc random level = 15 mcg/ml, ~12 hours after dose given - increased to 1500 mg IV q24h  Microbiology results: 11/20 blood: negative  Thank you for allowing pharmacy to be a part of this patient's care.  Genaro Zebedee Calin, [place] 08/07/2024 12:48 PM

## 2024-08-07 NOTE — Progress Notes (Signed)
.  [facility]  PROCEDURAL EXPEDITER PROGRESS NOTE  Patient Name: Kinnie Kaupp  DOB:21-Apr-1931 Date of Admission: 08/02/2024  Date of Assessment:08/07/24   -------------------------------------------------------------------------------------------------------------------   Brief clinical summary: Pt to OR tomorrow for left third toe amputation. Will be made NPO at midnight.  Orders in place:  Yes   Communication with surgical team if no orders: N/A  Labs, test, and orders reviewed: Y  Requires surgical clearance:  Y   What type of clearance: Vascular  Clearance received: Y  Barriers noted: N   Intervention provided by [facility] team: N/A  Barrier resolved: not applicable   -------------------------------------------------------------------------------------------------------------------  [employer], [place], [place] Please contact us  directly via secure chat (search for [facility]) or by calling us  at [phone] [facility]).

## 2024-08-07 NOTE — Anesthesia Preprocedure Evaluation (Signed)
 Anesthesia Evaluation  Patient identified by MRN, date of birth, ID band Patient awake    Reviewed: Allergy & Precautions, NPO status , Patient's Chart, lab work & pertinent test results  History of Anesthesia Complications Negative for: history of anesthetic complications  Airway Mallampati: II  TM Distance: >3 FB Neck ROM: Full    Dental no notable dental hx. (+) Teeth Intact, Dental Advisory Given   Pulmonary neg pulmonary ROS   Pulmonary exam normal breath sounds clear to auscultation       Cardiovascular hypertension, (-) angina + CAD and +CHF  (-) Past MI Normal cardiovascular exam+ Valvular Problems/Murmurs (mild s/p TAVR) MR  Rhythm:Regular Rate:Normal  01/19/2024 echo  1. Left ventricular ejection fraction, by estimation, is 55 to 60%. The  left ventricle has normal function. The left ventricle has no regional  wall motion abnormalities. There is moderate left ventricular hypertrophy.  Left ventricular diastolic  parameters are consistent with Grade I diastolic dysfunction (impaired  relaxation).   2. Right ventricular systolic function is normal. The right ventricular  size is mildly enlarged. Tricuspid regurgitation signal is inadequate for  assessing PA pressure.   3. Left atrial size was mildly dilated.   4. Right atrial size was moderately dilated.   5. The mitral valve is degenerative. Mild mitral valve regurgitation. No  evidence of mitral stenosis. Moderate mitral annular calcification.   6. The inferior vena cava is normal in size with greater than 50%  respiratory variability, suggesting right atrial pressure of 3 mmHg.   7. The aortic valve has been repaired/replaced. Aortic valve  regurgitation is mild. There is a 26 mm Edwards Sapien prosthetic (TAVR)  valve present in the aortic position. Vmax 2.6 m/s, MG , EOA  1.6cm^2, DI 0.42. Mild PVL     Neuro/Psych negative neurological ROS  negative psych  ROS   GI/Hepatic negative GI ROS, Neg liver ROS,,,  Endo/Other  diabetesHypothyroidism    Renal/GU Lab Results      Component                Value               Date                            K                        3.7                 08/07/2024                CO2                      20 (L)              08/07/2024                BUN                      8                   08/07/2024                CREATININE               0.71                08/07/2024  GFRNONAA                 >60                 08/07/2024                    Musculoskeletal  (+) Arthritis ,    Abdominal   Peds  Hematology  (+) Blood dyscrasia, anemia Lab Results      Component                Value               Date                      WBC                      6.2                 08/07/2024                HGB                      11.0 (L)            08/07/2024                HCT                      34.3 (L)            08/07/2024                MCV                      84.9                08/07/2024                PLT                      177                 08/07/2024              Anesthesia Other Findings   Reproductive/Obstetrics                              Anesthesia Physical Anesthesia Plan  ASA: 3  Anesthesia Plan: MAC   Post-op Pain Management: Ofirmev  IV (intra-op)*   Induction: Intravenous  PONV Risk Score and Plan: 2 and Propofol  infusion, Treatment may vary due to age or medical condition and Midazolam   Airway Management Planned: Natural Airway and Nasal Cannula  Additional Equipment: None  Intra-op Plan:   Post-operative Plan:   Informed Consent: I have reviewed the patients History and Physical, chart, labs and discussed the procedure including the risks, benefits and alternatives for the proposed anesthesia with the patient or authorized representative who has indicated his/her understanding and acceptance.     Dental advisory  given  Plan Discussed with: CRNA and Anesthesiologist  Anesthesia Plan Comments:          Anesthesia Quick Evaluation

## 2024-08-07 NOTE — Discharge Instructions (Signed)

## 2024-08-08 ENCOUNTER — Inpatient Hospital Stay (HOSPITAL_COMMUNITY): Payer: Self-pay | Admitting: Anesthesiology

## 2024-08-08 ENCOUNTER — Encounter (HOSPITAL_COMMUNITY): Payer: Self-pay | Admitting: Internal Medicine

## 2024-08-08 ENCOUNTER — Encounter (HOSPITAL_COMMUNITY): Admission: RE | Disposition: A | Payer: Self-pay | Source: Home / Self Care | Attending: Family Medicine

## 2024-08-08 ENCOUNTER — Inpatient Hospital Stay (HOSPITAL_COMMUNITY)

## 2024-08-08 ENCOUNTER — Other Ambulatory Visit: Payer: Self-pay

## 2024-08-08 ENCOUNTER — Other Ambulatory Visit (HOSPITAL_COMMUNITY): Payer: Self-pay

## 2024-08-08 DIAGNOSIS — M869 Osteomyelitis, unspecified: Secondary | ICD-10-CM

## 2024-08-08 DIAGNOSIS — I5021 Acute systolic (congestive) heart failure: Secondary | ICD-10-CM

## 2024-08-08 DIAGNOSIS — M86172 Other acute osteomyelitis, left ankle and foot: Secondary | ICD-10-CM | POA: Diagnosis not present

## 2024-08-08 DIAGNOSIS — Z9889 Other specified postprocedural states: Secondary | ICD-10-CM | POA: Diagnosis not present

## 2024-08-08 DIAGNOSIS — I70213 Atherosclerosis of native arteries of extremities with intermittent claudication, bilateral legs: Secondary | ICD-10-CM

## 2024-08-08 DIAGNOSIS — I110 Hypertensive heart disease with heart failure: Secondary | ICD-10-CM | POA: Diagnosis not present

## 2024-08-08 DIAGNOSIS — I2510 Atherosclerotic heart disease of native coronary artery without angina pectoris: Secondary | ICD-10-CM

## 2024-08-08 DIAGNOSIS — E1169 Type 2 diabetes mellitus with other specified complication: Secondary | ICD-10-CM

## 2024-08-08 DIAGNOSIS — M19072 Primary osteoarthritis, left ankle and foot: Secondary | ICD-10-CM | POA: Diagnosis not present

## 2024-08-08 DIAGNOSIS — Z89422 Acquired absence of other left toe(s): Secondary | ICD-10-CM | POA: Diagnosis not present

## 2024-08-08 HISTORY — PX: AMPUTATION TOE: SHX6595

## 2024-08-08 LAB — CBC
HCT: 31.6 % — ABNORMAL LOW (ref 39.0–52.0)
Hemoglobin: 10.2 g/dL — ABNORMAL LOW (ref 13.0–17.0)
MCH: 27.3 pg (ref 26.0–34.0)
MCHC: 32.3 g/dL (ref 30.0–36.0)
MCV: 84.5 fL (ref 80.0–100.0)
Platelets: 177 K/uL (ref 150–400)
RBC: 3.74 MIL/uL — ABNORMAL LOW (ref 4.22–5.81)
RDW: 15.4 % (ref 11.5–15.5)
WBC: 5.6 K/uL (ref 4.0–10.5)
nRBC: 0 % (ref 0.0–0.2)

## 2024-08-08 LAB — BASIC METABOLIC PANEL WITH GFR
Anion gap: 10 (ref 5–15)
BUN: 9 mg/dL (ref 8–23)
CO2: 20 mmol/L — ABNORMAL LOW (ref 22–32)
Calcium: 8.4 mg/dL — ABNORMAL LOW (ref 8.9–10.3)
Chloride: 107 mmol/L (ref 98–111)
Creatinine, Ser: 0.77 mg/dL (ref 0.61–1.24)
GFR, Estimated: >60 mL/min (ref >60)
Glucose, Bld: 161 mg/dL — ABNORMAL HIGH (ref 70–99)
Potassium: 3.5 mmol/L (ref 3.5–5.1)
Sodium: 137 mmol/L (ref 135–145)

## 2024-08-08 LAB — GLUCOSE, CAPILLARY
Glucose-Capillary: 148 mg/dL — ABNORMAL HIGH (ref 70–99)
Glucose-Capillary: 155 mg/dL — ABNORMAL HIGH (ref 70–99)

## 2024-08-08 LAB — SURGICAL PCR SCREEN
MRSA, PCR: NEGATIVE
Staphylococcus aureus: POSITIVE — AB

## 2024-08-08 LAB — MAGNESIUM: Magnesium: 1.7 mg/dL (ref 1.7–2.4)

## 2024-08-08 LAB — PHOSPHORUS: Phosphorus: 2.7 mg/dL (ref 2.5–4.6)

## 2024-08-08 SURGERY — AMPUTATION, TOE
Anesthesia: Monitor Anesthesia Care | Site: Toe | Laterality: Left

## 2024-08-08 MED ORDER — BUPIVACAINE HCL (PF) 0.5 % IJ SOLN
INTRAMUSCULAR | Status: AC
  Filled 2024-08-08: qty 10

## 2024-08-08 MED ORDER — ONDANSETRON HCL 4 MG/2ML IJ SOLN
4.0000 mg | Freq: Once | INTRAMUSCULAR | Status: DC | PRN
Start: 2024-08-08 — End: 2024-08-08

## 2024-08-08 MED ORDER — CHLORHEXIDINE GLUCONATE CLOTH 2 % EX PADS
6.0000 | MEDICATED_PAD | Freq: Every day | CUTANEOUS | Status: DC
  Administered 2024-08-08: 6 via TOPICAL

## 2024-08-08 MED ORDER — EPHEDRINE 5 MG/ML INJ
INTRAVENOUS | Status: AC
  Filled 2024-08-08: qty 5

## 2024-08-08 MED ORDER — LIDOCAINE HCL (PF) 1 % IJ SOLN
INTRAMUSCULAR | Status: AC
  Filled 2024-08-08: qty 30

## 2024-08-08 MED ORDER — MUPIROCIN 2 % EX OINT
1.0000 | TOPICAL_OINTMENT | Freq: Two times a day (BID) | CUTANEOUS | Status: DC
  Administered 2024-08-08: 1 via NASAL

## 2024-08-08 MED ORDER — CLOPIDOGREL BISULFATE 75 MG PO TABS
75.0000 mg | ORAL_TABLET | Freq: Every day | ORAL | 0 refills | Status: DC
  Filled 2024-08-08: qty 30, 30d supply, fill #0

## 2024-08-08 MED ORDER — CEPHALEXIN 500 MG PO CAPS
500.0000 mg | ORAL_CAPSULE | Freq: Three times a day (TID) | ORAL | 0 refills | Status: AC
  Filled 2024-08-08: qty 9, 3d supply, fill #0

## 2024-08-08 MED ORDER — PROPOFOL 500 MG/50ML IV EMUL
INTRAVENOUS | Status: DC | PRN
Start: 2024-08-08 — End: 2024-08-08
  Administered 2024-08-08: 100 ug/kg/min via INTRAVENOUS

## 2024-08-08 MED ORDER — LIDOCAINE HCL (PF) 1 % IJ SOLN
INTRAMUSCULAR | Status: DC | PRN
  Administered 2024-08-08: 5 mL

## 2024-08-08 MED ORDER — ROSUVASTATIN CALCIUM 10 MG PO TABS
10.0000 mg | ORAL_TABLET | Freq: Every day | ORAL | 0 refills | Status: DC
  Filled 2024-08-08: qty 30, 30d supply, fill #0

## 2024-08-08 MED ORDER — CHLORHEXIDINE GLUCONATE 0.12 % MT SOLN
15.0000 mL | Freq: Once | OROMUCOSAL | Status: AC
  Administered 2024-08-08: 15 mL via OROMUCOSAL

## 2024-08-08 MED ORDER — LACTATED RINGERS IV SOLN: INTRAVENOUS | Status: DC

## 2024-08-08 MED ORDER — SODIUM CHLORIDE 0.9 % IR SOLN
Status: DC | PRN
  Administered 2024-08-08: 500 mL

## 2024-08-08 MED ORDER — CHLORHEXIDINE GLUCONATE 0.12 % MT SOLN
OROMUCOSAL | Status: AC
  Filled 2024-08-08: qty 15

## 2024-08-08 MED ORDER — ORAL CARE MOUTH RINSE: 15.0000 mL | Freq: Once | OROMUCOSAL | Status: AC

## 2024-08-08 MED ORDER — BUPIVACAINE HCL (PF) 0.5 % IJ SOLN
INTRAMUSCULAR | Status: DC | PRN
  Administered 2024-08-08: 5 mL

## 2024-08-08 MED ORDER — ACETAMINOPHEN 10 MG/ML IV SOLN: 1000.0000 mg | Freq: Once | INTRAVENOUS | Status: DC | PRN

## 2024-08-08 MED ORDER — TOBRAMYCIN SULFATE 80 MG/2ML IJ SOLN
INTRAMUSCULAR | Status: AC
  Filled 2024-08-08: qty 2

## 2024-08-08 MED ORDER — VANCOMYCIN HCL 500 MG IV SOLR
INTRAVENOUS | Status: AC
  Filled 2024-08-08: qty 10

## 2024-08-08 MED ORDER — FENTANYL CITRATE (PF) 100 MCG/2ML IJ SOLN: 25.0000 ug | INTRAMUSCULAR | Status: DC | PRN

## 2024-08-08 MED ORDER — FENTANYL CITRATE (PF) 100 MCG/2ML IJ SOLN
INTRAMUSCULAR | Status: AC
  Filled 2024-08-08: qty 2

## 2024-08-08 SURGICAL SUPPLY — 36 items
BLADE AVERAGE 25X9 (BLADE) IMPLANT
BLADE SURG 10 STRL SS (BLADE) ×2 IMPLANT
BLADE SURG 15 STRL LF DISP TIS (BLADE) ×2 IMPLANT
BNDG COHESIVE 3X5 TAN ST LF (GAUZE/BANDAGES/DRESSINGS) ×2 IMPLANT
BNDG COMPR ESMARK 4X3 LF (GAUZE/BANDAGES/DRESSINGS) ×2 IMPLANT
BNDG ELASTIC 3INX 5YD STR LF (GAUZE/BANDAGES/DRESSINGS) ×2 IMPLANT
BNDG ELASTIC 4INX 5YD STR LF (GAUZE/BANDAGES/DRESSINGS) IMPLANT
BNDG GAUZE DERMACEA FLUFF 4 (GAUZE/BANDAGES/DRESSINGS) IMPLANT
BNDG STRETCH 4X75 STRL LF (GAUZE/BANDAGES/DRESSINGS) IMPLANT
CHLORAPREP W/TINT 26 (MISCELLANEOUS) IMPLANT
DRSG ADAPTIC 3X8 NADH LF (GAUZE/BANDAGES/DRESSINGS) IMPLANT
DRSG XEROFORM 1X8 (GAUZE/BANDAGES/DRESSINGS) IMPLANT
ELECTRODE REM PT RTRN 9FT ADLT (ELECTROSURGICAL) ×2 IMPLANT
GAUZE PAD ABD 8X10 STRL (GAUZE/BANDAGES/DRESSINGS) IMPLANT
GAUZE SPONGE 2X2 STRL 8-PLY (GAUZE/BANDAGES/DRESSINGS) IMPLANT
GAUZE SPONGE 4X4 12PLY STRL (GAUZE/BANDAGES/DRESSINGS) ×2 IMPLANT
GAUZE STRETCH 2X75IN STRL (MISCELLANEOUS) ×2 IMPLANT
GAUZE XEROFORM 1X8 LF (GAUZE/BANDAGES/DRESSINGS) ×2 IMPLANT
GLOVE BIO SURGEON STRL SZ7.5 (GLOVE) ×2 IMPLANT
GLOVE BIOGEL PI IND STRL 7.5 (GLOVE) ×2 IMPLANT
GOWN STRL REUS W/ TWL LRG LVL3 (GOWN DISPOSABLE) ×4 IMPLANT
KIT BASIN OR (CUSTOM PROCEDURE TRAY) ×2 IMPLANT
NDL HYPO 25X1 1.5 SAFETY (NEEDLE) ×2 IMPLANT
PACK ORTHO EXTREMITY (CUSTOM PROCEDURE TRAY) ×2 IMPLANT
PADDING CAST ABS COTTON 4X4 ST (CAST SUPPLIES) ×4 IMPLANT
SET HNDPC FAN SPRY TIP SCT (DISPOSABLE) IMPLANT
SOLN STERILE WATER BTL 1000 ML (IV SOLUTION) ×2 IMPLANT
SPIKE FLUID TRANSFER (MISCELLANEOUS) IMPLANT
STOCKINETTE 4X48 STRL (DRAPES) IMPLANT
SUT ETHILON 3 0 FSLX (SUTURE) IMPLANT
SUT PROLENE 3 0 PS 2 (SUTURE) IMPLANT
SUT PROLENE 4 0 PS 2 18 (SUTURE) IMPLANT
SYR CONTROL 10ML LL (SYRINGE) ×2 IMPLANT
TUBE CONNECTING 12X1/4 (SUCTIONS) IMPLANT
UNDERPAD 30X36 HEAVY ABSORB (UNDERPADS AND DIAPERS) ×2 IMPLANT
YANKAUER SUCT BULB TIP NO VENT (SUCTIONS) IMPLANT

## 2024-08-08 NOTE — Progress Notes (Signed)
 Orthopedic Tech Progress Note Patient Details:  Danni Leabo [birth year] [id number]  Ortho Devices Type of Ortho Device: Postop shoe/boot Ortho Device/Splint Location: LLE/ Fitted and removed ;eft at bedside Ortho Device/Splint Interventions: Ordered, Application, Adjustment   Post Interventions Patient Tolerated: Well Instructions Provided: Adjustment of device, Care of device  Adine MARLA Blush 08/08/2024, 8:49 AM

## 2024-08-08 NOTE — Transfer of Care (Signed)
 Immediate Anesthesia Transfer of Care Note  Patient: Omar Haley  Procedure(s) Performed: AMPUTATION, LEFT THIRD TOE (Left: Toe)  Patient Location: PACU  Anesthesia Type:MAC  Level of Consciousness: awake, alert , oriented, and patient cooperative  Airway & Oxygen Therapy: Patient Spontanous Breathing  Post-op Assessment: Report given to RN and Post -op Vital signs reviewed and stable  Post vital signs: Reviewed and stable  Last Vitals:  Vitals Value Taken Time  BP 137/58 08/08/24 08:13  Temp    Pulse 63 08/08/24 08:16  Resp 24 08/08/24 08:16  SpO2 97 % 08/08/24 08:16  Vitals shown include unfiled device data.  Last Pain:  Vitals:   08/08/24 0725  TempSrc: Oral  PainSc: 0-No pain         Complications: No notable events documented.

## 2024-08-08 NOTE — Hospital Course (Addendum)
 Omar Haley is a 89 y.o. male with PMH of HTN, Aortic stenosis, HFrEF, prostate cancer, HLD, TAVR, lumbar laminectomy and hypothyroidism presents to the ED with complaints of longstanding swelling of his left foot with infection.  He is following up with podiatrist and had an MRI of his left foot which is positive for osteomyelitis of the distal third phalanx and cellulitis appears to be progressively getting worse. Patient has taken multiple courses of oral doxycycline for his cellulitis but shows no significant improvement.  Lower extremity Doppler is negative for DVT.  Patient was directly admitted from podiatry office for osteomyelitis.  Patient has abnormal ABI on the left side. So Vascular surgery consulted ss/p angiogram and optimized in the left lower extremity for podiatry intervention and s.p partial left third toe amputation. Planning for discharge home on oral antibiotics with outpatient follow-up  Subjective: Seen and examined today Pain control wife at the bedside.  Wants to go home Overnight potassium this morning Overnight vital stable afebrile,Labs blood sugar 150s CBC BMP stable  Discharge Diagnoses:   Acute Left third toe osteomyelitis: Status post left third toe amputation at PIPJ level 11/26. Discussed with Dr. Malvin and recommending keflex  500 tid for 3 days post op, follow up next week wed dressings to remain c/d/I until then . PT OT consulted did well and no further need.   Cont WBAT on left foot post op shoe.  Critical LLE ischemia: S/p angiogram in an effort to improve distal perfusion to promote wound healing. Right lower extremity will possibly require common femoral endarterectomy and anterograde stenting of SFA and intervention of single-vessel peroneal runoff vessel.  Follow-up in office in 4 to 6 weeks for noninvasive studies. Continue aspirin  81, Plavix , Crestor .  HFpEF-euvolemic Hypertension: Stable continue home meds-amlodipine ,  losartan   Hypothyroidism: Continue Synthroid   History of TAVR: Takes amoxicillin  prior to dental procedure only   History of lumbar laminectomy: Continue pain control  DVT prophylaxis: heparin  injection 5,000 Units Start: 08/06/24 2200 SCDs Start: 08/02/24 2011 Place TED hose Start: 08/02/24 2011 Code Status:   Code Status: Full Code Family Communication: plan of care discussed with patient at bedside. Patient status is: Remains hospitalized because of severity of illness Level of care: Progressive Cardiac   Dispo: The patient is from: home            Anticipated disposition: Home  Objective: Vitals last 24 hrs: Vitals:   08/08/24 0813 08/08/24 0815 08/08/24 0830 08/08/24 1004  BP: (!) 137/58 (!) 132/49 (!) 167/58 (!) 167/58  Pulse: 65 60 78   Resp: 17 10 20    Temp: 97.9 F (36.6 C)  97.6 F (36.4 C)   TempSrc:      SpO2: 95% 95% 97%   Weight:      Height:       Physical Examination: General exam: alert awake, oriented, older than stated age HEENT:Oral mucosa moist, Ear/Nose WNL grossly Respiratory system: Bilaterally clear BS,no use of accessory muscle Cardiovascular system: S1 & S2 +, No JVD. Gastrointestinal system: Abdomen soft,NT,ND, BS+ Nervous System: Alert, awake, moving all extremities,and following commands. Extremities: extremities warm, leg edema neg left foot with dressing in place Skin: Warm, no rashes MSK: Normal muscle bulk,tone, power

## 2024-08-08 NOTE — Progress Notes (Signed)
 OT Cancellation Note  Patient Details Name: Teshaun Olarte MRN: [id number] DOB: 06-13-1931   Cancelled Treatment:    Reason Eval/Treat Not Completed: OT screened, no needs identified, will sign off (per discussion with PT, pt mobilizing well and with no acute OT needs at this time, will screen. Please reconsult if there is a change in pt status.)  Preston Weill K, OTD, OTR/L [facility] [phone]   Laneta MARLA Pereyra 08/08/2024, 11:49 AM

## 2024-08-08 NOTE — TOC Transition Note (Signed)
 Transition of Care (TOC) - Discharge Note Rayfield Gobble RN, BSN Inpatient Care Management Unit 4E- RN Case Manager See Treatment Team for direct phone #   Patient Details  Name: Omar Haley MRN: [id number] Date of Birth: 1931/05/01  Transition of Care [facility]) CM/SW Contact:  Gobble Rayfield Hurst, RN Phone Number: 08/08/2024, 12:56 PM   Clinical Narrative:    Pt stable for transition home today, no HH or DME needs noted. Pt has RW at home. Spouse to transport home.   No IP CM intervention needs noted.    Final next level of care: Home/Self Care Barriers to Discharge: No Barriers Identified   Patient Goals and CMS Choice Patient states their goals for this hospitalization and ongoing recovery are:: return home   Choice offered to / list presented to : NA      Discharge Placement               Home        Discharge Plan and Services Additional resources added to the After Visit Summary for   In-house Referral: NA Discharge Planning Services: NA Post Acute Care Choice: NA          DME Arranged: N/A DME Agency: NA       HH Arranged: NA HH Agency: NA        Social Drivers of Health (SDOH) Interventions SDOH Screenings   Food Insecurity: No Food Insecurity (08/02/2024)  Housing: Low Risk  (08/02/2024)  Transportation Needs: No Transportation Needs (08/02/2024)  Utilities: Not At Risk (08/02/2024)  Social Connections: Moderately Integrated (08/02/2024)  Tobacco Use: Low Risk  (08/08/2024)     Readmission Risk Interventions    08/08/2024   12:56 PM  Readmission Risk Prevention Plan  Transportation Screening Complete  PCP or Specialist Appt within 5-7 Days Complete  Home Care Screening Complete  Medication Review (RN CM) Complete

## 2024-08-08 NOTE — Progress Notes (Signed)
 History and Physical Interval Note:  08/08/2024 6:53 AM  Omar Haley  has presented today for surgery, with the diagnosis of osteomyelitis of distal phalanx of left third toe.  The various methods of treatment have been discussed with the patient and family. After consideration of risks, benefits and other options for treatment, the patient has consented to   Procedure(s) with comments: AMPUTATION, TOE (Left) - 3RD LEFT TOE as a surgical intervention.  The patient's history has been reviewed, patient examined, no change in status, stable for surgery.  I have reviewed the patient's chart and labs.  Questions were answered to the patient's satisfaction.     Omar Haley

## 2024-08-08 NOTE — Op Note (Signed)
 Full Operative Report  Date of Operation: 8:08 AM, 08/08/2024   Patient: Omar Haley - 89 y.o. male  Surgeon: Omar Haley [facility]  Assistant: None  Diagnosis: Left third toe osteomyelitis  Procedure:  1. Left third toe amputation at PIPJ level    Anesthesia: Monitor Anesthesia Care  Omar Garnette LABOR, MD  Anesthesiologist: Omar Garnette LABOR, MD CRNA: Omar Jarred, CRNA   Estimated Blood Loss: Minimal   Hemostasis: 1) Anatomical dissection, mechanical compression, electrocautery 2) No tourniquet was used  Implants: None  Materials: prolene 3-0  Injectables: 1) Pre-operatively: 10 cc of 50:50 mixture 1%lidocaine  plain and 0.5% marcaine  plain 2) Post-operatively: None   Specimens: - Pathology: left third toe - Microbiology: None   Antibiotics: IV antibiotics given per schedule on the floor  Drains: None  Complications: Patient tolerated the procedure well without complication.   Operative findings: As below in detailed report  Indications for Procedure: Omar Haley presents to Omar Haley [facility], DPM with a chief complaint of osteomyelitis of the left third toe distal phalanx on MRI. The patient has failed conservative treatments of various modalities. At this time the patient has elected to proceed with surgical correction. All alternatives, risks, and complications of the procedures were thoroughly explained to the patient. Patient exhibits appropriate understanding of all discussion points and informed consent was signed and obtained in the chart with no guarantees to surgical outcome given or implied.  Description of Procedure: Patient was brought to the operating room. Patient remained on their hospital bed in the supine position. A surgical timeout was performed and all members of the operating room, the procedure, and the surgical site were identified. anesthesia occurred as per anesthesia record. Local anesthetic as previously described was  then injected about the operative field in a local infiltrative block.  The operative lower extremity as noted above was then prepped and draped in the usual sterile manner. The following procedure then began.  Attention was directed to the third digit on the LEFT foot. A full-thickness incision encompassing the entire digit was made using a #15 blade. Dissection was carried down to bone. The toe was secured with a towel clamp, further dissected in its entirety, and disarticulated at the PIPJ and passed to the back table as a gross specimen. This was then labled and sent to pathology. The bone was noted to be soft and eroded, and consistent with osteomyelitis. All remaining necrotic and devitalized soft tissue structures were visualized and dissected away using sharp and dull dissection. Care was taken to protect all neurovascular structures throughout the dissection. All bleeders were cauterized as necessary.  The area was then flushed with copious amounts of sterile saline. Then using the suture materials previously described, the site was closed in anatomic layers and the skin was well approximated under minimal tension.   The surgical site was then dressed with xeroform 4x4 kerlix ace. The patient tolerated both the procedure and anesthesia well with vital signs stable throughout. The patient was transferred in good condition and all vital signs stable  from the OR to recovery under the discretion of anesthesia.  Condition: Vital signs stable, neurovascular status unchanged from preoperative   Surgical plan:  Expect clean margin, good bleeding, leave dressing c/d/I, rec 3 days keflex  500 TID on dc. Stable for dc later today or  tmrw am per primary. Follow up next week office will call  The patient will be WBAT in a post op shoe to the operative limb until further instructed. The dressing  is to remain clean, dry, and intact. Will continue to follow unless noted elsewhere.   Marsa Honour,  DPM [facility]

## 2024-08-08 NOTE — Progress Notes (Signed)
 Patient discharged from unit iv's removed   medications and property returned to designated person. Discharge instructions reviewed and patient questions answered

## 2024-08-08 NOTE — Progress Notes (Addendum)
 PHARMACIST LIPID MONITORING   Omar Haley is a 89 y.o. male s/p stent left SFA 08/06/24. Pharmacy has been consulted to optimize lipid-lowering therapy with the indication of secondary prevention for clinical ASCVD.  Recent Labs:  Lipid Panel (last 6 months):   Lab Results  Component Value Date   CHOL 184 08/07/2024   TRIG 279 (H) 08/07/2024   HDL 39 (L) 08/07/2024   CHOLHDL 4.7 08/07/2024   VLDL 56 (H) 08/07/2024   LDLCALC 89 08/07/2024    Hepatic function panel (last 6 months):   Lab Results  Component Value Date   AST 17 08/02/2024   ALT 14 08/02/2024   ALKPHOS 72 08/02/2024   BILITOT 0.6 08/02/2024    SCr (since admission):   Serum creatinine: 0.77 mg/dL 88/73/74 9696 Estimated creatinine clearance: 63 mL/min  Current therapy and lipid therapy tolerance Current lipid-lowering therapy: none PTA, rosuvastatin  10 mg daily added 11/25 Previous lipid-lowering therapies (if applicable): simvastatin in the past Documented or reported allergies or intolerances to lipid-lowering therapies (if applicable): laryngitis with ezetimibe; unknown reaction to gemfibrozil  Assessment:   Prior statin, then deferred due to age  Rosuvastatin  10 mg daily begin 11/25; moderate-intensity, reasonable dose  Plan:   Rosuvastatin  10 mg daily added 11/25. Follow-up labs after discharge:  Changes in lipid therapy were made. Check a lipid panel in 8-12 weeks then annually.     Omar Haley, RPh 08/08/2024, 11:25 AM

## 2024-08-08 NOTE — Evaluation (Signed)
 Physical Therapy Evaluation Patient Details Name: Omar Haley MRN: [id number] DOB: 1931-05-22 Today's Date: 08/08/2024  History of Present Illness  Pt is a 89 y.o. male admitted 11/20 with L foot osteomyelitis. He underwent L 3rd toe amputation 11/25. PMH:  HTN, aortic stenosis, HFrEF, prostate cancer, HLD, TAVR, lumbar laminectomy and hypothyroidism  Clinical Impression  PT eval complete. PTA pt lived at home with spouse, independent and active. On eval, pt demo mod I bed mobility. Supervision transfers, CGA amb 300' without AD, and CGA ascend/descend 3 steps with 1 rail. Educated pt and wife on WBAT and don/doff post op shoe. Pt does have RW at home if needed. All education complete. Plan is for d/c home today. No follow up services indicated. No DME needs. PT signing off.        If plan is discharge home, recommend the following: A little help with bathing/dressing/bathroom;Assistance with cooking/housework;Assist for transportation;Help with stairs or ramp for entrance   Can travel by private vehicle        Equipment Recommendations None recommended by PT  Recommendations for Other Services       Functional Status Assessment Patient has had a recent decline in their functional status and demonstrates the ability to make significant improvements in function in a reasonable and predictable amount of time.     Precautions / Restrictions Precautions Precautions: Fall Recall of Precautions/Restrictions: Intact Required Braces or Orthoses: Other Brace Other Brace: L post op shoe Restrictions Weight Bearing Restrictions Per Provider Order: Yes LLE Weight Bearing Per Provider Order: Weight bearing as tolerated Other Position/Activity Restrictions: in post op shoe      Mobility  Bed Mobility Overal bed mobility: Modified Independent             General bed mobility comments: HOB elevated, increased time    Transfers Overall transfer level: Needs assistance Equipment  used: None Transfers: Sit to/from Stand Sit to Stand: Supervision           General transfer comment: increased time to stabilize initial balance    Ambulation/Gait Ambulation/Gait assistance: Contact guard assist Gait Distance (Feet): 300 Feet Assistive device: None Gait Pattern/deviations: Step-through pattern, Decreased stride length, Decreased weight shift to left Gait velocity: decreased Gait velocity interpretation: <1.31 ft/sec, indicative of household ambulator   General Gait Details: Mildly unsteady but no reaching for UE support  Stairs Stairs: Yes Stairs assistance: Contact guard assist Stair Management: One rail Right, Step to pattern, Forwards Number of Stairs: 3    Wheelchair Mobility     Tilt Bed    Modified Rankin (Stroke Patients Only)       Balance Overall balance assessment: Mild deficits observed, not formally tested                                           Pertinent Vitals/Pain Pain Assessment Pain Assessment: No/denies pain    Home Living Family/patient expects to be discharged to:: Private residence Living Arrangements: Spouse/significant other Available Help at Discharge: Family;Available 24 hours/day Type of Home: House Home Access: Stairs to enter Entrance Stairs-Rails: Right;Left;Can reach both Entrance Stairs-Number of Steps: 6 through front door, 16 from basement garage   Home Layout: One level Home Equipment: Agricultural Consultant (2 wheels);Shower seat      Prior Function Prior Level of Function : Independent/Modified Independent;Driving  Mobility Comments: Very active, gardening, riding lawn mower ADLs Comments: ind     Extremity/Trunk Assessment   Upper Extremity Assessment Upper Extremity Assessment: Overall WFL for tasks assessed    Lower Extremity Assessment Lower Extremity Assessment: LLE deficits/detail LLE Deficits / Details: s/p 3rd toe amp    Cervical / Trunk  Assessment Cervical / Trunk Assessment: Kyphotic  Communication   Communication Communication: Impaired Factors Affecting Communication: Hearing impaired    Cognition Arousal: Alert Behavior During Therapy: WFL for tasks assessed/performed   PT - Cognitive impairments: No apparent impairments                         Following commands: Intact       Cueing Cueing Techniques: Verbal cues     General Comments General comments (skin integrity, edema, etc.): VSS on RA    Exercises     Assessment/Plan    PT Assessment Patient does not need any further PT services  PT Problem List         PT Treatment Interventions      PT Goals (Current goals can be found in the Care Plan section)  Acute Rehab PT Goals Patient Stated Goal: home today PT Goal Formulation: All assessment and education complete, DC therapy    Frequency       Co-evaluation               AM-PAC PT 6 Clicks Mobility  Outcome Measure Help needed turning from your back to your side while in a flat bed without using bedrails?: None Help needed moving from lying on your back to sitting on the side of a flat bed without using bedrails?: None Help needed moving to and from a bed to a chair (including a wheelchair)?: A Little Help needed standing up from a chair using your arms (e.g., wheelchair or bedside chair)?: A Little Help needed to walk in hospital room?: A Little Help needed climbing 3-5 steps with a railing? : A Little 6 Click Score: 20    End of Session Equipment Utilized During Treatment: Gait belt;Other (comment) (post op shoe) Activity Tolerance: Patient tolerated treatment well Patient left: in bed;with call bell/phone within reach;with family/visitor present Nurse Communication: Mobility status PT Visit Diagnosis: Difficulty in walking, not elsewhere classified (R26.2)    Time: 1125-1150 PT Time Calculation (min) (ACUTE ONLY): 25 min   Charges:   PT Evaluation $PT  Eval Moderate Complexity: 1 Mod   PT General Charges $$ ACUTE PT VISIT: 1 Visit         Sari MATSU., PT  Office # [phone]   Erven Sari Shaker 08/08/2024, 12:02 PM

## 2024-08-08 NOTE — Anesthesia Postprocedure Evaluation (Signed)
 Anesthesia Post Note  Patient: Omar Haley  Procedure(s) Performed: AMPUTATION, LEFT THIRD TOE (Left: Toe)     Patient location during evaluation: PACU Anesthesia Type: MAC Level of consciousness: awake and alert Pain management: pain level controlled Vital Signs Assessment: post-procedure vital signs reviewed and stable Respiratory status: spontaneous breathing, nonlabored ventilation, respiratory function stable and patient connected to nasal cannula oxygen Cardiovascular status: stable and blood pressure returned to baseline Postop Assessment: no apparent nausea or vomiting Anesthetic complications: no   No notable events documented.  Last Vitals:  Vitals:   08/08/24 0830 08/08/24 1004  BP: (!) 167/58 (!) 167/58  Pulse: 78   Resp: 20   Temp: 36.4 C   SpO2: 97%     Last Pain:  Vitals:   08/08/24 0830  TempSrc:   PainSc: 0-No pain                 Garnette DELENA Gab

## 2024-08-08 NOTE — Discharge Summary (Addendum)
 Physician Discharge Summary  Omar Haley FMW:[id number] DOB: 1931/05/09 DOA: 08/02/2024  PCP: Larnell Hamilton, MD  Admit date: 08/02/2024 Discharge date: 08/08/2024 Recommendations for Outpatient Follow-up:  Follow up with PCP in 1 weeks-call for appointment Follow-up with Dr. Malvin next week Please obtain BMP/CBC in one week Keep the dressing intact until seen in the office  Discharge Dispo: Home Discharge Condition: Stable Code Status:   Code Status: Full Code Diet recommendation:  Diet Order             Diet Heart Room service appropriate? Yes; Fluid consistency: Thin  Diet effective now           Diet - low sodium heart healthy                   Brief/Interim Summary: Omar Haley is a 89 y.o. male with PMH of HTN, Aortic stenosis, HFrEF, prostate cancer, HLD, TAVR, lumbar laminectomy and hypothyroidism presents to the ED with complaints of longstanding swelling of his left foot with infection.  He is following up with podiatrist and had an MRI of his left foot which is positive for osteomyelitis of the distal third phalanx and cellulitis appears to be progressively getting worse. Patient has taken multiple courses of oral doxycycline for his cellulitis but shows no significant improvement.  Lower extremity Doppler is negative for DVT.  Patient was directly admitted from podiatry office for osteomyelitis.  Patient has abnormal ABI on the left side. So Vascular surgery consulted ss/p angiogram and optimized in the left lower extremity for podiatry intervention and s.p partial left third toe amputation. Planning for discharge home on oral antibiotics with outpatient follow-up  Subjective: Seen and examined today Pain control wife at the bedside.  Wants to go home Overnight potassium this morning Overnight vital stable afebrile,Labs blood sugar 150s CBC BMP stable  Discharge Diagnoses:   Acute Left third toe osteomyelitis: Status post left third toe amputation at  PIPJ level 11/26. Discussed with Dr. Malvin and recommending keflex  500 tid for 3 days post op, follow up next week wed dressings to remain c/d/I until then . PT OT consulted did well and no further need.   Cont WBAT on left foot post op shoe.  Critical LLE ischemia: S/p angiogram in an effort to improve distal perfusion to promote wound healing. Right lower extremity will possibly require common femoral endarterectomy and anterograde stenting of SFA and intervention of single-vessel peroneal runoff vessel.  Follow-up in office in 4 to 6 weeks for noninvasive studies. Continue aspirin  81, Plavix , Crestor .  HFpEF-euvolemic Hypertension: Stable continue home meds-amlodipine , losartan   Hypothyroidism: Continue Synthroid   History of TAVR: Takes amoxicillin  prior to dental procedure only   History of lumbar laminectomy: Continue pain control  DVT prophylaxis: heparin  injection 5,000 Units Start: 08/06/24 2200 SCDs Start: 08/02/24 2011 Place TED hose Start: 08/02/24 2011 Code Status:   Code Status: Full Code Family Communication: plan of care discussed with patient at bedside. Patient status is: Remains hospitalized because of severity of illness Level of care: Progressive Cardiac   Dispo: The patient is from: home            Anticipated disposition: Home  Objective: Vitals last 24 hrs: Vitals:   08/08/24 0813 08/08/24 0815 08/08/24 0830 08/08/24 1004  BP: (!) 137/58 (!) 132/49 (!) 167/58 (!) 167/58  Pulse: 65 60 78   Resp: 17 10 20    Temp: 97.9 F (36.6 C)  97.6 F (36.4 C)   TempSrc:  SpO2: 95% 95% 97%   Weight:      Height:       Physical Examination: General exam: alert awake, oriented, older than stated age HEENT:Oral mucosa moist, Ear/Nose WNL grossly Respiratory system: Bilaterally clear BS,no use of accessory muscle Cardiovascular system: S1 & S2 +, No JVD. Gastrointestinal system: Abdomen soft,NT,ND, BS+ Nervous System: Alert, awake, moving all  extremities,and following commands. Extremities: extremities warm, leg edema neg left foot with dressing in place Skin: Warm, no rashes MSK: Normal muscle bulk,tone, power      Procedure(s) (LRB): AMPUTATION, LEFT THIRD TOE (Left)  Consultation: See note.  Discharge Instructions  Discharge Instructions     Diet - low sodium heart healthy   Complete by: As directed    Discharge instructions   Complete by: As directed    Follow-up with PCP 1 week, follow-up with podiatry  as instructed  Please call call MD or return to ER for similar or worsening recurring problem that brought you to hospital or if any fever,nausea/vomiting,abdominal pain, uncontrolled pain, chest pain,  shortness of breath or any other alarming symptoms.  Please follow-up your doctor as instructed in a week time and call the office for appointment.  Please avoid alcohol , smoking, or any other illicit substance and maintain healthy habits including taking your regular medications as prescribed.  You were cared for by a hospitalist during your hospital stay. If you have any questions about your discharge medications or the care you received while you were in the hospital after you are discharged, you can call the unit and ask to speak with the hospitalist on call if the hospitalist that took care of you is not available.  Once you are discharged, your primary care physician will handle any further medical issues. Please note that NO REFILLS for any discharge medications will be authorized once you are discharged, as it is imperative that you return to your primary care physician (or establish a relationship with a primary care physician if you do not have one) for your aftercare needs so that they can reassess your need for medications and monitor your lab values  .   Discharge wound care:   Complete by: As directed    dressings to remain c/d/I until seen in the office next week please call the office.   Increase  activity slowly   Complete by: As directed       Allergies as of 08/08/2024       Reactions   Aldactone  [spironolactone ] Itching   Scalp itching   Lopid [gemfibrozil] Other (See Comments)   Unknown reaction   Micardis [telmisartan] Diarrhea   Vasotec [enalapril] Cough   Zetia [ezetimibe] Other (See Comments)   Laryngitis    Coreg  [carvedilol ] Rash   Glucophage [metformin] Rash        Medication List     STOP taking these medications    doxycycline 100 MG capsule Commonly known as: VIBRAMYCIN       TAKE these medications    acetaminophen  650 MG CR tablet Commonly known as: TYLENOL  Take 650 mg by mouth every 8 (eight) hours as needed for pain.   amLODipine  10 MG tablet Commonly known as: NORVASC  TAKE 1 TABLET(10 MG) BY MOUTH DAILY   amoxicillin  500 MG tablet Commonly known as: AMOXIL  Take 4 tablets by mouth 1 hour prior to dental procedures and cleanings.   Aspirin  81 MG EC tablet Take 81 mg by mouth daily.   cephALEXin  500 MG capsule Commonly known as:  KEFLEX  Take 1 capsule (500 mg total) by mouth 3 (three) times daily for 3 days.   clopidogrel  75 MG tablet Commonly known as: PLAVIX  Take 1 tablet (75 mg total) by mouth daily with breakfast. Start taking on: August 09, 2024   latanoprost  0.005 % ophthalmic solution Commonly known as: XALATAN  Place 1 drop into both eyes at bedtime.   levothyroxine  50 MCG tablet Commonly known as: SYNTHROID  Take 50 mcg by mouth daily before breakfast.   losartan  25 MG tablet Commonly known as: COZAAR  TAKE 1 TABLET(25 MG) BY MOUTH AT BEDTIME   rosuvastatin  10 MG tablet Commonly known as: CRESTOR  Take 1 tablet (10 mg total) by mouth daily. Start taking on: August 09, 2024   triamcinolone cream 0.1 % Commonly known as: KENALOG Apply 1 Application topically 2 (two) times daily as needed (skin irritation).               Discharge Care Instructions  (From admission, onward)           Start      Ordered   08/08/24 0000  Discharge wound care:       Comments: dressings to remain c/d/I until seen in the office next week please call the office.   08/08/24 1152            Follow-up Information     [facility] Follow up in 6 week(s).   Specialty: Vascular Surgery Why: Office will call you to arrange your appt (sent). Contact information: [address] [place]  [address] [phone]        Larnell Hamilton, MD Follow up in 1 week(s).   Specialty: Internal Medicine Contact information: [address] [place] [address] [phone]                Allergies  Allergen Reactions   Aldactone  [Spironolactone ] Itching    Scalp itching   Lopid [Gemfibrozil] Other (See Comments)    Unknown reaction   Micardis [Telmisartan] Diarrhea   Vasotec [Enalapril] Cough   Zetia [Ezetimibe] Other (See Comments)    Laryngitis    Coreg  [Carvedilol ] Rash   Glucophage [Metformin] Rash    The results of significant diagnostics from this hospitalization (including imaging, microbiology, ancillary and laboratory) are listed below for reference.    Microbiology: Recent Results (from the past 240 hours)  Culture, blood (Routine X 2) w Reflex to ID Panel     Status: None   Collection Time: 08/02/24  8:30 PM   Specimen: BLOOD  Result Value Ref Range Status   Specimen Description BLOOD SITE NOT SPECIFIED  Final   Special Requests   Final    BOTTLES DRAWN AEROBIC AND ANAEROBIC Blood Culture adequate volume   Culture   Final    NO GROWTH 5 DAYS Performed at [facility], [address], [place], [place] [address]    Report Status 08/07/2024 FINAL  Final  Culture, blood (Routine X 2) w Reflex to ID Panel     Status: None   Collection Time: 08/02/24  8:36 PM   Specimen: BLOOD  Result Value Ref Range Status   Specimen Description BLOOD SITE NOT SPECIFIED  Final   Special Requests    Final    BOTTLES DRAWN AEROBIC AND ANAEROBIC Blood Culture adequate volume   Culture   Final    NO GROWTH 5 DAYS Performed at [facility],  [address] [place], [place] [address]    Report Status 08/07/2024 FINAL  Final  Surgical pcr screen     Status: Abnormal   Collection Time: 08/08/24  3:26 AM   Specimen: Nasal Mucosa; Nasal Swab  Result Value Ref Range Status   MRSA, PCR NEGATIVE NEGATIVE Final   Staphylococcus aureus POSITIVE (A) NEGATIVE Final    Comment: (NOTE) The Xpert SA Assay (FDA approved for NASAL specimens in patients 88 years of age and older), is one component of a comprehensive surveillance program. It is not intended to diagnose infection nor to guide or monitor treatment. Performed at [facility], 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 Sheree, MD Procedure Performed: 1.  Percutaneous ultrasound-guided cannulation right common femoral artery 2.  Catheter selection of aorta and aortogram with bilateral lower extremity angiography 3.  Catheter selection left posterior tibial artery 4.  Stent left SFA with 6 x 150 mm Eluvia and 6 x 120 mm Eluvia postdilated with 5 mm Mustang 5.  Moderate sedation with fentanyl  and Versed  for 53 minutes Indications: 89 year old male with history of TAVR now with left middle toe ulceration has been evaluated by podiatry.  He has depressed ABIs bilaterally and is indicated for angiography with possible invention of the left lower extremity. Findings: The aorta is patent however distal aorta has areas which appear to be soft plaque but is nonflow limiting.  The bilateral renal arteries are patent however the right side has  approximately 70% stenosis.  Bilateral hypogastric arteries are patent.  On the left side the external iliac artery leading into the common femoral artery is patent and the profunda and SFA are widely patent proximally.  The SFA in the midsegment becomes long segment stenosis up to 95%.  Distally he has single-vessel runoff via a very large peroneal artery.  Posterior tibial artery is initially patent and then reconstitutes at the foot level after occlusion at the ankle.  I did attempt to cross the occlusion in the distal leg however I was unable to and patient has single-vessel runoff via the peroneal artery remaining.  After stenting he has less than 10% residual stenosis throughout the SFA.  On the right side the common femoral artery appears to be subtotally occluded with known previous access.  The SFA is diseased however only 50% stenosis.  Distally he has only peroneal artery runoff with approximately 90% stenosis in the midsegment. Patient is optimized in the left lower extremity for podiatry intervention.  Right lower extremity will possibly require common femoral endarterectomy and antegrade stenting of the SFA and intervention of the single-vessel peroneal runoff vessel.  Procedure:  The patient was identified in the holding area and taken to room 8.  The patient was then placed supine on the table and prepped and draped in the usual sterile fashion.  A time out was called.  Ultrasound was used to evaluate the right common femoral artery.  This was heavily diseased there was only a pulse at the most cephalad aspect of this artery.  The area was anesthetized 1% lidocaine  and cannulated with a micropuncture needle followed by wire and sheath.  Ultrasound images saved to the permanent record.  Concomitantly we administered fentanyl  and Versed  as moderate sedation his vital signs  were monitored throughout the case.  We placed the Bentson wire and fluoroscopic guidance followed by 5 French sheath.  An Omni  catheter was placed to the level of L1 we performed aortogram and pulled to the bifurcation with catheter performed pelvic angiography.  The bifurcation was then crossed with Omni cath and Bentson wire left lower extremity angiography was performed from the left common femoral artery.  With the above findings we placed the [facility] followed by a long 6 French sheath and the patient was fully heparinized.  Using a quick cross and Glidewire advantage we were able to traverse the subtotally occluded SFA and confirmed intraluminal access to the popliteal artery.  I then used a CXI catheter and combination of V18 and command wires was able to get to the level of the ankle but could not cross the occluded posterior tibial artery.  With this I exchanged back for the Glidewire advantage and primarily stented the SFA with 2 drug-eluting stents postdilated with 5 mm balloon.  Completion demonstrated only minimal residual stenosis measuring less than 10% throughout the SFA with brisk flow distally.  Satisfied with this we exchanged for a short 6 French sheath in the right groin and perform right lower extremity angiography with the above findings.  Sheath will be pulled in postoperative holding.  He tolerated the procedure without immediate complication.  There was a very strong peroneal artery signal at the ankle completion. Contrast: 95cc Brandon C. Sheree, MD [facility] Office: [phone] Pager: [phone]   VAS US  ABI WITH/WO TBI Result Date: 08/03/2024  LOWER EXTREMITY DOPPLER STUDY Patient Name:  Omar Haley  Date of Exam:   08/03/2024 Medical Rec #: [id number]      Accession #:    [id number] Date of Birth: [birth year]     Patient Gender: M Patient Age:   76 years Exam Location[facility]  [facility] Procedure:      VAS US  ABI WITH/WO TBI Referring Phys: MICAELA SUNDIL --------------------------------------------------------------------------------  Indications: Left toe  ulceration / osteomyelitis High Risk Factors: Hypertension, hyperlipidemia, coronary artery disease. Other Factors: Hx of TAVR, CHF, Hx of prostate cancer with radiation treatment.  Comparison Study: No previous exams Performing Technologist: Leigh Rom RVT/RDMS  Examination Guidelines: A complete evaluation includes at minimum, Doppler waveform signals and systolic blood pressure reading at the level of bilateral brachial, anterior tibial, and posterior tibial arteries, when vessel segments are accessible. Bilateral testing is considered an integral part of a complete examination. Photoelectric Plethysmograph (PPG) waveforms and toe systolic pressure readings are included as required and additional duplex testing as needed. Limited examinations for reoccurring indications may be performed as noted.  ABI Findings: +---------+------------------+-----+----------+--------+ Right    Rt Pressure (mmHg)IndexWaveform  Comment  +---------+------------------+-----+----------+--------+ Brachial 177                    triphasic          +---------+------------------+-----+----------+--------+ PTA      70                0.39 monophasic         +---------+------------------+-----+----------+--------+ DP       72                0.40 monophasic         +---------+------------------+-----+----------+--------+ Great Toe38                0.21 Abnormal           +---------+------------------+-----+----------+--------+ +---------+------------------+-----+----------+-------+  Left     Lt Pressure (mmHg)IndexWaveform  Comment +---------+------------------+-----+----------+-------+ Brachial 180                    triphasic         +---------+------------------+-----+----------+-------+ PTA      118               0.66 monophasic        +---------+------------------+-----+----------+-------+ DP       65                0.36 monophasic         +---------+------------------+-----+----------+-------+ Great Toe56                0.31 Abnormal          +---------+------------------+-----+----------+-------+  Summary: Right: Resting right ankle-brachial index indicates severe right lower extremity arterial disease. The right toe-brachial index is abnormal.  Left: Resting left ankle-brachial index indicates moderate left lower extremity arterial disease. The left toe-brachial index is abnormal.  *See table(s) above for measurements and observations.  Suggest Peripheral Vascular Consult. Electronically signed by Fonda Rim on 08/03/2024 at 7:24:09 PM.    Final    MR FOOT LEFT WO CONTRAST Result Date: 08/02/2024 CLINICAL DATA:  Sore on the third toe. EXAM: MRI OF THE LEFT FOOT WITHOUT CONTRAST TECHNIQUE: Multiplanar, multisequence MR imaging of the left forefoot was performed. No intravenous contrast was administered. COMPARISON:  None Available. FINDINGS: Bones/Joint/Cartilage Marrow edema and confluent T1 hypointensity of the third distal phalanx, compatible with osteomyelitis. No marrow signal abnormality identified elsewhere to suggest osteomyelitis. No fracture or dislocation. No joint effusion. Mild degenerative changes of the first MTP joint with joint space narrowing and osteophytosis. Ligaments Collateral ligaments are intact. Muscles and Tendons No significant tenosynovitis. Mild fatty infiltration and increased T2 signal of the intrinsic foot musculature, may reflect chronic denervation change. Soft tissue Cutaneous irregularity and thickening of the distal third toe. No loculated fluid collection. Subcutaneous edema extending along the dorsal forefoot. IMPRESSION: 1. Findings compatible with osteomyelitis of the third distal phalanx. Overlying cutaneous irregularity and thickening. No loculated fluid collection. 2. Mild osteoarthritis of the first MTP joint. 3. Subcutaneous edema extending along the dorsal forefoot. Electronically Signed    By: Harrietta Sherry M.D.   On: 08/02/2024 11:18    Labs: BNP (last 3 results) No results for input(s): BNP in the last 8760 hours. Basic Metabolic Panel: Recent Labs  Lab 08/02/24 2036 08/03/24 0417 08/06/24 0523 08/07/24 0358 08/08/24 0303  NA 135 137 139 136 137  K 3.6 3.5 4.1 3.7 3.5  CL 99 100 105 106 107  CO2 24 23 23  20* 20*  GLUCOSE 231* 155* 152* 153* 161*  BUN 13 11 13 8 9   CREATININE 0.91 0.78 0.82 0.71 0.77  CALCIUM  8.7* 8.5* 8.8* 8.6* 8.4*  MG  --   --   --   --  1.7  PHOS  --   --   --   --  2.7   Liver Function Tests: Recent Labs  Lab 08/02/24 2036  AST 17  ALT 14  ALKPHOS 72  BILITOT 0.6  PROT 6.2*  ALBUMIN 3.4*   No results for input(s): LIPASE, AMYLASE in the last 168 hours. No results for input(s): AMMONIA in the last 168 hours. CBC: Recent Labs  Lab 08/02/24 2036 08/03/24 0417 08/06/24 0523 08/07/24 0358 08/08/24 0303  WBC 4.8 4.6 4.7 6.2 5.6  HGB 11.1* 11.4* 11.5* 11.0* 10.2*  HCT  33.7* 35.0* 35.9* 34.3* 31.6*  MCV 83.4 83.5 84.7 84.9 84.5  PLT 187 178 177 177 177   CBG: Recent Labs  Lab 08/07/24 1118 08/07/24 1625 08/07/24 2059 08/08/24 0626 08/08/24 0815  GLUCAP 149* 165* 167* 148* 155*  Lipid Profile Recent Labs    08/07/24 0358  CHOL 184  HDL 39*  LDLCALC 89  TRIG 720*  CHOLHDL 4.7   Thyroid  function studies No results for input(s): TSH, T4TOTAL, T3FREE, THYROIDAB in the last 72 hours.  Invalid input(s): FREET3 Urinalysis    Component Value Date/Time   COLORURINE STRAW (A) 01/14/2023 0845   APPEARANCEUR CLEAR 01/14/2023 0845   LABSPEC 1.009 01/14/2023 0845   PHURINE 6.0 01/14/2023 0845   GLUCOSEU 50 (A) 01/14/2023 0845   HGBUR NEGATIVE 01/14/2023 0845   BILIRUBINUR NEGATIVE 01/14/2023 0845   KETONESUR NEGATIVE 01/14/2023 0845   PROTEINUR NEGATIVE 01/14/2023 0845   UROBILINOGEN 0.2 04/28/2011 1634   NITRITE NEGATIVE 01/14/2023 0845   LEUKOCYTESUR NEGATIVE 01/14/2023 0845   Sepsis  Labs Recent Labs  Lab 08/03/24 0417 08/06/24 0523 08/07/24 0358 08/08/24 0303  WBC 4.6 4.7 6.2 5.6   Microbiology Recent Results (from the past 240 hours)  Culture, blood (Routine X 2) w Reflex to ID Panel     Status: None   Collection Time: 08/02/24  8:30 PM   Specimen: BLOOD  Result Value Ref Range Status   Specimen Description BLOOD SITE NOT SPECIFIED  Final   Special Requests   Final    BOTTLES DRAWN AEROBIC AND ANAEROBIC Blood Culture adequate volume   Culture   Final    NO GROWTH 5 DAYS Performed at [facility], [address] [place], [place] [address]    Report Status 08/07/2024 FINAL  Final  Culture, blood (Routine X 2) w Reflex to ID Panel     Status: None   Collection Time: 08/02/24  8:36 PM   Specimen: BLOOD  Result Value Ref Range Status   Specimen Description BLOOD SITE NOT SPECIFIED  Final   Special Requests   Final    BOTTLES DRAWN AEROBIC AND ANAEROBIC Blood Culture adequate volume   Culture   Final    NO GROWTH 5 DAYS Performed at [facility], [address], [place], [place] [address]    Report Status 08/07/2024 FINAL  Final  Surgical pcr screen     Status: Abnormal   Collection Time: 08/08/24  3:26 AM   Specimen: Nasal Mucosa; Nasal Swab  Result Value Ref Range Status   MRSA, PCR NEGATIVE NEGATIVE Final   Staphylococcus aureus POSITIVE (A) NEGATIVE Final    Comment: (NOTE) The Xpert SA Assay (FDA approved for NASAL specimens in patients 58 years of age and older), is one component of a comprehensive surveillance program. It is not intended to diagnose infection nor to guide or monitor treatment. Performed at [facility], [address] [place], [place] [address]    Time coordinating discharge: 35  minutes  SIGNED: Mennie LAMY, MD  [facility] 08/08/2024, 12:34 PM  If 7PM-7AM, please contact night-coverage [email or web address]

## 2024-08-09 ENCOUNTER — Encounter (HOSPITAL_COMMUNITY): Payer: Self-pay | Admitting: Podiatry

## 2024-08-14 LAB — SURGICAL PATHOLOGY

## 2024-08-15 ENCOUNTER — Ambulatory Visit (INDEPENDENT_AMBULATORY_CARE_PROVIDER_SITE_OTHER)

## 2024-08-15 ENCOUNTER — Ambulatory Visit: Admitting: Podiatry

## 2024-08-15 ENCOUNTER — Encounter: Payer: Self-pay | Admitting: Podiatry

## 2024-08-15 VITALS — Ht 70.0 in | Wt 160.0 lb

## 2024-08-15 DIAGNOSIS — M86172 Other acute osteomyelitis, left ankle and foot: Secondary | ICD-10-CM

## 2024-08-15 NOTE — Progress Notes (Addendum)
 Chief Complaint  Patient presents with   Routine Post Op    POV#1 - DOS 08/08/24 - AMPUTATION, TOE (Left) 3RD TOE, pt states everything is going well, has no pain or complaints.    Subjective:  Patient presents today status post partial third toe amputation left foot.  DOS: 08/08/2024.  Inpatient.  Dr. Malvin.  Doing well.  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 Wendel & Dr. Lucas   Unifocal PVCs    Vitamin D deficiency     Past Surgical History:  Procedure Laterality Date   ABDOMINAL AORTOGRAM W/LOWER EXTREMITY N/A 08/06/2024   Procedure: ABDOMINAL AORTOGRAM W/LOWER EXTREMITY;  Surgeon: Sheree Penne Bruckner, MD;  Location: [facility] INVASIVE CV LAB;  Service: Cardiovascular;  Laterality: N/A;   AMPUTATION TOE Left 08/08/2024   Procedure: AMPUTATION, LEFT THIRD TOE;  Surgeon: Malvin Marsa FALCON, DPM;  Location: [facility] OR;  Service: Orthopedics/Podiatry;  Laterality: Left;   APPENDECTOMY     COLON SURGERY  09/13/1989   rt hemicolectomy   HERNIA REPAIR     INTRAOPERATIVE TRANSTHORACIC ECHOCARDIOGRAM N/A 01/18/2023   Procedure: INTRAOPERATIVE TRANSTHORACIC ECHOCARDIOGRAM;  Surgeon: Wendel Lurena POUR, MD;  Location: [facility] INVASIVE CV LAB;  Service: Open Heart Surgery;  Laterality: N/A;   LOWER EXTREMITY INTERVENTION  08/06/2024   Procedure: LOWER EXTREMITY INTERVENTION;  Surgeon: Sheree Penne Bruckner, MD;  Location: [facility] INVASIVE CV LAB;  Service: Cardiovascular;;   LUMBAR LAMINECTOMY/DECOMPRESSION MICRODISCECTOMY Left 11/21/2023   Procedure: Left Lumbar Four-Five Microdiscectomy;  Surgeon: Joshua Alm Hamilton, MD;  Location: [facility] OR;  Service: Neurosurgery;  Laterality: Left;   MELANOMA EXCISION  11/2005&04/2011   skin   RIGHT/LEFT HEART CATH AND CORONARY ANGIOGRAPHY N/A 01/06/2023   Procedure: RIGHT/LEFT HEART CATH AND CORONARY ANGIOGRAPHY;  Surgeon: Wendel Lurena POUR, MD;  Location: [facility] INVASIVE CV LAB;  Service: Cardiovascular;  Laterality: N/A;   TRANSCATHETER AORTIC VALVE REPLACEMENT, TRANSFEMORAL N/A 01/18/2023   Procedure: Transcatheter Aortic Valve Replacement, Transfemoral;  Surgeon: Thukkani, Arun K, MD;  Location: [facility] INVASIVE CV LAB;  Service: Open Heart Surgery;  Laterality: N/A;    Allergies  Allergen Reactions   Aldactone  [Spironolactone ] Itching    Scalp itching   Lopid [Gemfibrozil] Other (See Comments)    Unknown reaction   Micardis [Telmisartan] Diarrhea   Vasotec [Enalapril] Cough   Zetia [Ezetimibe] Other (See Comments)    Laryngitis    Coreg  [Carvedilol ] Rash   Glucophage [Metformin] Rash    Objective/Physical Exam Incision well coapted with sutures intact. No sign of infectious process noted. No dehiscence. No active bleeding noted.  Chronic moderate edema noted to the surgical extremity.  Radiographic Exam LT foot 08/15/2024:  Interval partial amputation of the left third toe.  No complicating features noted  Assessment: 1. s/p partial third toe amputation LT.  DOS: 08/08/2024.  Dr. Malvin.  Inpatient   Plan of Care:  -Patient was evaluated. X-rays reviewed - Okay to wash and shower and get the foot wet.  Recommend Betadine over the incision with a light Band-Aid daily.  Supplies provided -Okay to transition out of the postop shoe back into regular tennis shoes -Return to clinic 2 weeks with Dr. Malvin for suture removal   Thresa EMERSON Sar,  DPM [facility]  Dr. Thresa EMERSON Sar, DPM    [address] [place], [place] [address]                Office [phone]  Fax [phone]

## 2024-08-29 ENCOUNTER — Encounter: Payer: Self-pay | Admitting: Podiatry

## 2024-08-29 ENCOUNTER — Ambulatory Visit: Admitting: Podiatry

## 2024-08-29 VITALS — Ht 70.0 in | Wt 160.0 lb

## 2024-08-29 DIAGNOSIS — M86172 Other acute osteomyelitis, left ankle and foot: Secondary | ICD-10-CM | POA: Diagnosis not present

## 2024-08-29 NOTE — Progress Notes (Signed)
 Chief Complaint  Patient presents with   Routine Post Op    POV#2 - DOS 08/08/24 - AMPUTATION, TOE (Left) pt is here to f/u on left foot after surgery he states no pain and has no complaints, his stiches were removed.    Subjective:  Patient presents today status post partial third toe amputation left foot.  DOS: 08/08/2024.  Inpatient.  Dr. Malvin.  Doing well.  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 Wendel & Dr. Lucas   Unifocal PVCs    Vitamin D deficiency     Past Surgical History:  Procedure Laterality Date   ABDOMINAL AORTOGRAM W/LOWER EXTREMITY N/A 08/06/2024   Procedure: ABDOMINAL AORTOGRAM W/LOWER EXTREMITY;  Surgeon: Sheree Penne Bruckner, MD;  Location: [facility] INVASIVE CV LAB;  Service: Cardiovascular;  Laterality: N/A;   AMPUTATION TOE Left 08/08/2024   Procedure: AMPUTATION, LEFT THIRD TOE;  Surgeon: Malvin Marsa FALCON, DPM;  Location: [facility] OR;  Service: Orthopedics/Podiatry;  Laterality: Left;   APPENDECTOMY     COLON SURGERY  09/13/1989   rt hemicolectomy   HERNIA REPAIR     INTRAOPERATIVE TRANSTHORACIC ECHOCARDIOGRAM N/A 01/18/2023   Procedure: INTRAOPERATIVE TRANSTHORACIC ECHOCARDIOGRAM;  Surgeon: Wendel Lurena POUR, MD;  Location: [facility] INVASIVE CV LAB;  Service: Open Heart Surgery;  Laterality: N/A;   LOWER EXTREMITY INTERVENTION  08/06/2024   Procedure: LOWER EXTREMITY  INTERVENTION;  Surgeon: Sheree Penne Bruckner, MD;  Location: [facility] INVASIVE CV LAB;  Service: Cardiovascular;;   LUMBAR LAMINECTOMY/DECOMPRESSION MICRODISCECTOMY Left 11/21/2023   Procedure: Left Lumbar Four-Five Microdiscectomy;  Surgeon: Joshua Alm Hamilton, MD;  Location: [facility] OR;  Service: Neurosurgery;  Laterality: Left;   MELANOMA EXCISION  11/2005&04/2011   skin   RIGHT/LEFT HEART CATH AND CORONARY ANGIOGRAPHY N/A 01/06/2023   Procedure: RIGHT/LEFT HEART CATH AND CORONARY ANGIOGRAPHY;  Surgeon: Wendel Lurena POUR, MD;  Location: [facility] INVASIVE CV LAB;  Service: Cardiovascular;  Laterality: N/A;   TRANSCATHETER AORTIC VALVE REPLACEMENT, TRANSFEMORAL N/A 01/18/2023   Procedure: Transcatheter Aortic Valve Replacement, Transfemoral;  Surgeon: Thukkani, Arun K, MD;  Location: [facility] INVASIVE CV LAB;  Service: Open Heart Surgery;  Laterality: N/A;    Allergies  Allergen Reactions   Aldactone  [Spironolactone ] Itching    Scalp itching   Lopid [Gemfibrozil] Other (See Comments)    Unknown reaction   Micardis [Telmisartan] Diarrhea   Vasotec [Enalapril] Cough   Zetia [Ezetimibe] Other (See Comments)    Laryngitis    Coreg  [Carvedilol ] Rash   Glucophage [Metformin] Rash    LT third toe 08/29/2024  Objective/Physical Exam Incision well coapted with sutures intact. No sign of infectious process noted. No dehiscence. No active bleeding noted.  No edema or erythema noted.  It appears that any cellulitis is completely resolved  Radiographic Exam LT foot 08/15/2024:  Interval partial  amputation of the left third toe.  No complicating features noted  Assessment: 1. s/p partial third toe amputation LT. DOS: 08/08/2024.  Dr. Malvin.  Inpatient   Plan of Care:  -Patient was evaluated.  -Sutures removed -Continue washing and showering getting the foot wet -Recommend triple antibiotic and a Band-Aid -Return to clinic 2 weeks final follow-up and x-ray   Thresa EMERSON Sar, DPM [facility]  Dr. Thresa EMERSON Sar, DPM    [address] [place], [place] [address]                Office [phone]  Fax [phone]

## 2024-09-11 ENCOUNTER — Encounter: Payer: Self-pay | Admitting: Podiatry

## 2024-09-11 ENCOUNTER — Ambulatory Visit (INDEPENDENT_AMBULATORY_CARE_PROVIDER_SITE_OTHER): Admitting: Podiatry

## 2024-09-11 DIAGNOSIS — M86172 Other acute osteomyelitis, left ankle and foot: Secondary | ICD-10-CM

## 2024-09-11 DIAGNOSIS — Z9889 Other specified postprocedural states: Secondary | ICD-10-CM | POA: Diagnosis not present

## 2024-09-11 NOTE — Progress Notes (Signed)
"  °  Subjective:  Patient ID: Omar Haley, male    DOB: 19-Jan-1931,  MRN: [id number]  Chief Complaint  Patient presents with   Routine Post Op    S/P partial third toe amputation LT. 0 pain. Non diabetic.    DOS: 08/08/2024 Procedure: Left third toe partial amputation  89 y.o. male seen for post op check.  Patient reports he is doing well he denies any pain to the amputation site.  Denies any redness or drainage.  Has been walking relatively normally no change in balance from previous surgery  Review of Systems: Negative except as noted in the HPI. Denies N/V/F/Ch.   Objective:   Constitutional Well developed. Well nourished.  Vascular Foot warm and well perfused. Capillary refill normal to all digits.   No calf pain with palpation  Neurologic Normal speech. Oriented to person, place, and time. Epicritic sensation diminished to left forefoot  Dermatologic Third toe amputation site well-healed no erythema drainage or open wound   Orthopedic: Status post partial left third toe amputation   Radiographs: Deferred today previously with amputation through the mid proximal phalanx level  Pathology: A. TOE, LEFT THIRD, AMPUTATION:  - Skin and underlying tissue with chronic inflammation and reactive  changes.  - Chronic osteomyelitis.  - Articular surface negative for osteomyelitis.   Micro: N/A  Assessment:   1. Acute osteomyelitis of left foot (HCC)   2. Post-operative state     Plan:  Patient was evaluated and treated and all questions answered.  Status post left partial third toe amputation well-healed at this time -Progressing very well he is fully healed no evidence of residual infection or wound at the amputation site -XR: Deferred -WB Status: Weightbearing as tolerated in regular shoe gear -Sutures: Previously removed. -Medications/ABX: No antibiotics or dressings required he is okay to wash the foot and apply moisturizer to the amputation site as  needed -Dressing: None required - F/u Plan: Follow-up as needed, discussed he can schedule routine nail care with our office as desired        Omar Haley, DPM [facility] / [facility] "

## 2024-09-19 ENCOUNTER — Ambulatory Visit: Admitting: Physician Assistant

## 2024-09-19 ENCOUNTER — Other Ambulatory Visit: Payer: Self-pay | Admitting: Physician Assistant

## 2024-09-19 ENCOUNTER — Ambulatory Visit (HOSPITAL_BASED_OUTPATIENT_CLINIC_OR_DEPARTMENT_OTHER)
Admission: RE | Admit: 2024-09-19 | Discharge: 2024-09-19 | Disposition: A | Discharge status: Home / Self Care | Source: Ambulatory Visit | Attending: Vascular Surgery | Admitting: Vascular Surgery

## 2024-09-19 ENCOUNTER — Ambulatory Visit (HOSPITAL_COMMUNITY)
Admission: RE | Admit: 2024-09-19 | Discharge: 2024-09-19 | Disposition: A | Discharge status: Home / Self Care | Source: Ambulatory Visit | Attending: Vascular Surgery | Admitting: Vascular Surgery

## 2024-09-19 ENCOUNTER — Encounter: Payer: Self-pay | Admitting: Physician Assistant

## 2024-09-19 VITALS — BP 190/68 | HR 75 | Temp 97.7°F | Wt 162.1 lb

## 2024-09-19 DIAGNOSIS — I70213 Atherosclerosis of native arteries of extremities with intermittent claudication, bilateral legs: Secondary | ICD-10-CM | POA: Insufficient documentation

## 2024-09-19 LAB — VAS US ABI WITH/WO TBI
Left ABI: 1.01
Right ABI: 0.41

## 2024-09-19 MED ORDER — ROSUVASTATIN CALCIUM 10 MG PO TABS: 10.0000 mg | ORAL_TABLET | Freq: Every day | ORAL | 0 refills | Status: AC

## 2024-09-19 MED ORDER — CLOPIDOGREL BISULFATE 75 MG PO TABS: 75.0000 mg | ORAL_TABLET | Freq: Every day | ORAL | 0 refills | Status: AC

## 2024-09-19 NOTE — Progress Notes (Signed)
 " Office Note     CC:  follow up Requesting Provider:  Larnell Hamilton, MD  HPI: Omar Haley is a 89 y.o. (11-22-1930) male who presents status post aortogram with left leg runoff and stenting of the left SFA by Dr. Sheree on 08/06/2024 due to critical limb ischemia with left third toe ulceration.  2 days later he underwent partial third toe amputation by Dr. Malvin.  He has since healed his partial toe amputation.  He denies any ongoing bruising or pain at his right groin catheterization site.  He is taking his aspirin  however has run out of statin and Plavix .  He is ambulatory without a walker.  He denies tobacco use.   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 Wendel & Dr. Lucas   Unifocal PVCs    Vitamin D deficiency     Past Surgical History:  Procedure Laterality Date   ABDOMINAL AORTOGRAM W/LOWER EXTREMITY N/A 08/06/2024   Procedure: ABDOMINAL AORTOGRAM W/LOWER EXTREMITY;  Surgeon: Sheree Penne Bruckner, MD;  Location: [facility] INVASIVE CV LAB;  Service: Cardiovascular;  Laterality: N/A;   AMPUTATION TOE Left 08/08/2024   Procedure: AMPUTATION, LEFT THIRD TOE;  Surgeon: Malvin Marsa FALCON, DPM;  Location: [facility] OR;  Service: Orthopedics/Podiatry;  Laterality: Left;   APPENDECTOMY     COLON SURGERY  09/13/1989   rt hemicolectomy   HERNIA REPAIR     INTRAOPERATIVE TRANSTHORACIC ECHOCARDIOGRAM N/A  01/18/2023   Procedure: INTRAOPERATIVE TRANSTHORACIC ECHOCARDIOGRAM;  Surgeon: Wendel Lurena POUR, MD;  Location: [facility] INVASIVE CV LAB;  Service: Open Heart Surgery;  Laterality: N/A;   LOWER EXTREMITY INTERVENTION  08/06/2024   Procedure: LOWER EXTREMITY INTERVENTION;  Surgeon: Sheree Penne Bruckner, MD;  Location: [facility] INVASIVE CV LAB;  Service: Cardiovascular;;   LUMBAR LAMINECTOMY/DECOMPRESSION MICRODISCECTOMY Left 11/21/2023   Procedure: Left Lumbar Four-Five Microdiscectomy;  Surgeon: Joshua Alm Hamilton, MD;  Location: [facility] OR;  Service: Neurosurgery;  Laterality: Left;   MELANOMA EXCISION  11/2005&04/2011   skin   RIGHT/LEFT HEART CATH AND CORONARY ANGIOGRAPHY N/A 01/06/2023   Procedure: RIGHT/LEFT HEART CATH AND CORONARY ANGIOGRAPHY;  Surgeon: Wendel Lurena POUR, MD;  Location: [facility] INVASIVE CV LAB;  Service: Cardiovascular;  Laterality: N/A;   TRANSCATHETER AORTIC VALVE REPLACEMENT, TRANSFEMORAL N/A 01/18/2023   Procedure: Transcatheter Aortic Valve Replacement, Transfemoral;  Surgeon: Thukkani, Arun K, MD;  Location: [facility] INVASIVE CV LAB;  Service: Open Heart Surgery;  Laterality: N/A;    Social History   Socioeconomic History   Marital status: Married    Spouse name: Not on file   Number of children: Not on file   Years of education: Not on file   Highest education level: Not on file  Occupational History   Not on file  Tobacco Use   Smoking status: Never   Smokeless tobacco: Never  Vaping Use   Vaping status: Never Used  Substance and Sexual Activity   Alcohol  use: No   Drug use: No   Sexual activity: Not on file  Other Topics Concern   Not on file  Social History Narrative   Not on file   Social Drivers of Health   Tobacco Use: Low Risk (09/19/2024)   Patient History    Smoking Tobacco Use: Never    Smokeless Tobacco Use: Never    Passive Exposure: Not on file  Financial Resource Strain: Not on file  Food Insecurity: No Food Insecurity (08/02/2024)   Epic    Worried  About Programme Researcher, Broadcasting/film/video in the Last Year: Never true    Ran Out of Food in the Last Year: Never true  Transportation Needs: No Transportation Needs (08/02/2024)   Epic    Lack of Transportation (Medical): No    Lack of Transportation (Non-Medical): No  Physical Activity: Not on file  Stress: Not on file  Social Connections: Moderately Integrated (08/02/2024)   Social Connection and Isolation Panel    Frequency of Communication with Friends and Family: Twice a week    Frequency of Social Gatherings with Friends and Family: Twice a week    Attends Religious Services: More than 4 times per year    Active Member of [employer] or Organizations: No    Attends Banker Meetings: Never    Marital Status: Married  Catering Manager Violence: Not At Risk (08/02/2024)   Epic    Fear of Current or Ex-Partner: No    Emotionally Abused: No    Physically Abused: No    Sexually Abused: No  Depression (PHQ2-9): Not on file  Alcohol  Screen: Not on file  Housing: Low Risk (08/02/2024)   Epic    Unable to Pay for Housing in the Last Year: No    Number of Times Moved in the Last Year: 0    Homeless in the Last Year: No  Utilities: Not At Risk (08/02/2024)   Epic    Threatened with loss of utilities: No  Health Literacy: Not on file    Family History  Problem Relation Age of Onset   Heart failure Mother    Cerebral aneurysm Father     Current Outpatient Medications  Medication Sig Dispense Refill   acetaminophen  (TYLENOL ) 650 MG CR tablet Take 650 mg by mouth every 8 (eight) hours as needed for pain.     amLODipine  (NORVASC ) 10 MG tablet TAKE 1 TABLET(10 MG) BY MOUTH DAILY 90 tablet 3   amoxicillin  (AMOXIL ) 500 MG tablet Take 4 tablets by mouth 1 hour prior to dental procedures and cleanings. 12 tablet 6   Aspirin  81 MG EC tablet Take 81 mg by mouth daily.     clopidogrel  (PLAVIX ) 75 MG tablet Take 1 tablet (75 mg total) by mouth daily with breakfast. 30 tablet 0   latanoprost   (XALATAN ) 0.005 % ophthalmic solution Place 1 drop into both eyes at bedtime.     levothyroxine  (SYNTHROID ) 50 MCG tablet Take 50 mcg by mouth daily before breakfast.     losartan  (COZAAR ) 25 MG tablet TAKE 1 TABLET(25 MG) BY MOUTH AT BEDTIME 90 tablet 3   rosuvastatin  (CRESTOR ) 10 MG tablet Take 1 tablet (10 mg total) by mouth daily. 30 tablet 0   triamcinolone cream (KENALOG) 0.1 % Apply 1 Application topically 2 (two) times daily as needed (skin irritation).     No current facility-administered medications for this visit.  Allergies[1]   REVIEW OF SYSTEMS:  Negative unless noted in HPI [X]  denotes positive finding, [ ]  denotes negative finding Cardiac  Comments:  Chest pain or chest pressure:    Shortness of breath upon exertion:    Short of breath when lying flat:    Irregular heart rhythm:        Vascular    Pain in calf, thigh, or hip brought on by ambulation:    Pain in feet at night that wakes you up from your sleep:     Blood clot in your veins:    Leg swelling:         Pulmonary    Oxygen at home:    Productive cough:     Wheezing:         Neurologic    Sudden weakness in arms or legs:     Sudden numbness in arms or legs:     Sudden onset of difficulty speaking or slurred speech:    Temporary loss of vision in one eye:     Problems with dizziness:         Gastrointestinal    Blood in stool:     Vomited blood:         Genitourinary    Burning when urinating:     Blood in urine:        Psychiatric    Major depression:         Hematologic    Bleeding problems:    Problems with blood clotting too easily:        Skin    Rashes or ulcers:        Constitutional    Fever or chills:      PHYSICAL EXAMINATION:  Vitals:   09/19/24 1422  BP: (!) 190/68  Pulse: 75  Temp: 97.7 F (36.5 C)  TempSrc: Temporal  Weight: 162 lb 1.6 oz (73.5 kg)    General:  WDWN in NAD; vital signs documented above Gait: Not observed HENT: WNL,  normocephalic Pulmonary: normal non-labored breathing Cardiac: regular HR Abdomen: soft, NT, no masses Skin: without rashes Vascular Exam/Pulses: Brisk left peroneal signal by Doppler Extremities: without ischemic changes, without Gangrene , without cellulitis; without open wounds; healed left third partial toe amp Musculoskeletal: no muscle wasting or atrophy  Neurologic: A&O X 3 Psychiatric:  The pt has Normal affect.   Non-Invasive Vascular Imaging:   Left SFA stent widely patent  ABI/TBIToday's ABIToday's TBIPrevious ABIPrevious TBI  +-------+-----------+-----------+------------+------------+  Right 0.41       0.26       0.40        0.21          +-------+-----------+-----------+------------+------------+  Left  1.01       0.52       0.66        0.31             ASSESSMENT/PLAN:: 89 y.o. male status both left SFA stenting  Omar Haley is a 89 year old male who underwent left SFA stenting due to critical limb ischemia with left third toe ulceration.  He subsequently underwent partial third toe amputation by Dr. Malvin.  The toe amputation site has since healed.  Duplex demonstrates a widely patent left SFA stent.  His left ABI and TBI have improved postoperatively.  He will continue his aspirin  daily.  I have refilled his Plavix  and statin.  We will repeat left lower extremity arterial duplex and ABI in 6 months.   Omar  Bethanie, PA-C [facility] [phone]  Clinic MD:   Sheree     [1]  Allergies Allergen Reactions   Aldactone  [Spironolactone ] Itching    Scalp itching   Lopid [Gemfibrozil] Other (See Comments)    Unknown reaction   Micardis [Telmisartan] Diarrhea   Vasotec [Enalapril] Cough   Zetia [Ezetimibe] Other (See Comments)    Laryngitis    Coreg  [Carvedilol ] Rash   Glucophage [Metformin] Rash   "

## 2024-09-20 ENCOUNTER — Other Ambulatory Visit: Payer: Self-pay | Admitting: *Deleted

## 2024-09-20 DIAGNOSIS — I70213 Atherosclerosis of native arteries of extremities with intermittent claudication, bilateral legs: Secondary | ICD-10-CM

## 2024-12-10 ENCOUNTER — Ambulatory Visit: Admitting: Podiatry

## 2025-03-27 ENCOUNTER — Ambulatory Visit (HOSPITAL_COMMUNITY)

## 2025-03-27 ENCOUNTER — Ambulatory Visit
